# Patient Record
Sex: Female | Born: 1975 | Race: White | Hispanic: No | Marital: Married | Smoking: Never smoker
Health system: Southern US, Community
[De-identification: ages and names within clinical notes are randomized; demographics above are authoritative.]

## PROBLEM LIST (undated history)

## (undated) DIAGNOSIS — R350 Frequency of micturition: Secondary | ICD-10-CM

## (undated) DIAGNOSIS — Z9889 Other specified postprocedural states: Secondary | ICD-10-CM

## (undated) DIAGNOSIS — R351 Nocturia: Secondary | ICD-10-CM

## (undated) DIAGNOSIS — R87610 Atypical squamous cells of undetermined significance on cytologic smear of cervix (ASC-US): Secondary | ICD-10-CM

## (undated) DIAGNOSIS — Z800 Family history of malignant neoplasm of digestive organs: Secondary | ICD-10-CM

## (undated) DIAGNOSIS — Z808 Family history of malignant neoplasm of other organs or systems: Secondary | ICD-10-CM

## (undated) DIAGNOSIS — Z8041 Family history of malignant neoplasm of ovary: Secondary | ICD-10-CM

## (undated) DIAGNOSIS — Z8739 Personal history of other diseases of the musculoskeletal system and connective tissue: Secondary | ICD-10-CM

## (undated) DIAGNOSIS — R3989 Other symptoms and signs involving the genitourinary system: Secondary | ICD-10-CM

## (undated) DIAGNOSIS — R102 Pelvic and perineal pain: Secondary | ICD-10-CM

## (undated) DIAGNOSIS — K219 Gastro-esophageal reflux disease without esophagitis: Secondary | ICD-10-CM

## (undated) DIAGNOSIS — F419 Anxiety disorder, unspecified: Secondary | ICD-10-CM

## (undated) DIAGNOSIS — E039 Hypothyroidism, unspecified: Secondary | ICD-10-CM

## (undated) DIAGNOSIS — Z801 Family history of malignant neoplasm of trachea, bronchus and lung: Secondary | ICD-10-CM

## (undated) DIAGNOSIS — Z87410 Personal history of cervical dysplasia: Secondary | ICD-10-CM

## (undated) DIAGNOSIS — Z803 Family history of malignant neoplasm of breast: Secondary | ICD-10-CM

## (undated) DIAGNOSIS — E063 Autoimmune thyroiditis: Secondary | ICD-10-CM

## (undated) DIAGNOSIS — R112 Nausea with vomiting, unspecified: Secondary | ICD-10-CM

## (undated) HISTORY — DX: Family history of malignant neoplasm of digestive organs: Z80.0

## (undated) HISTORY — DX: Anxiety disorder, unspecified: F41.9

## (undated) HISTORY — DX: Family history of malignant neoplasm of trachea, bronchus and lung: Z80.1

## (undated) HISTORY — DX: Family history of malignant neoplasm of ovary: Z80.41

## (undated) HISTORY — DX: Autoimmune thyroiditis: E06.3

## (undated) HISTORY — DX: Family history of malignant neoplasm of other organs or systems: Z80.8

## (undated) HISTORY — DX: Family history of malignant neoplasm of breast: Z80.3

---
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
## 1997-08-07 ENCOUNTER — Emergency Department (HOSPITAL_COMMUNITY): Admission: EM | Admit: 1997-08-07 | Discharge: 1997-08-07 | Payer: Self-pay | Admitting: Emergency Medicine

## 1998-02-26 HISTORY — PX: COLPOSCOPY: SHX161

## 1998-02-26 HISTORY — PX: TEMPOROMANDIBULAR JOINT SURGERY: SHX35

## 1998-07-18 ENCOUNTER — Other Ambulatory Visit: Admission: RE | Admit: 1998-07-18 | Discharge: 1998-07-18 | Payer: Self-pay | Admitting: Obstetrics and Gynecology

## 1998-07-20 ENCOUNTER — Ambulatory Visit (HOSPITAL_BASED_OUTPATIENT_CLINIC_OR_DEPARTMENT_OTHER): Admission: RE | Admit: 1998-07-20 | Discharge: 1998-07-20 | Payer: Self-pay | Admitting: Oral Surgery

## 1998-11-17 ENCOUNTER — Other Ambulatory Visit: Admission: RE | Admit: 1998-11-17 | Discharge: 1998-11-17 | Payer: Self-pay | Admitting: Obstetrics and Gynecology

## 1999-01-30 ENCOUNTER — Other Ambulatory Visit: Admission: RE | Admit: 1999-01-30 | Discharge: 1999-01-30 | Payer: Self-pay | Admitting: Obstetrics and Gynecology

## 1999-01-30 ENCOUNTER — Encounter (INDEPENDENT_AMBULATORY_CARE_PROVIDER_SITE_OTHER): Payer: Self-pay | Admitting: Specialist

## 1999-03-12 ENCOUNTER — Emergency Department (HOSPITAL_COMMUNITY): Admission: EM | Admit: 1999-03-12 | Discharge: 1999-03-12 | Payer: Self-pay | Admitting: Podiatry

## 1999-07-28 ENCOUNTER — Other Ambulatory Visit: Admission: RE | Admit: 1999-07-28 | Discharge: 1999-07-28 | Payer: Self-pay | Admitting: Obstetrics and Gynecology

## 2000-02-27 HISTORY — PX: OTHER SURGICAL HISTORY: SHX169

## 2000-09-06 ENCOUNTER — Other Ambulatory Visit: Admission: RE | Admit: 2000-09-06 | Discharge: 2000-09-06 | Payer: Self-pay | Admitting: Obstetrics and Gynecology

## 2000-09-17 ENCOUNTER — Ambulatory Visit (HOSPITAL_COMMUNITY): Admission: RE | Admit: 2000-09-17 | Discharge: 2000-09-17 | Payer: Self-pay | Admitting: Obstetrics and Gynecology

## 2000-09-17 ENCOUNTER — Encounter: Payer: Self-pay | Admitting: Obstetrics and Gynecology

## 2002-01-19 ENCOUNTER — Other Ambulatory Visit: Admission: RE | Admit: 2002-01-19 | Discharge: 2002-01-19 | Payer: Self-pay | Admitting: Gynecology

## 2002-02-02 ENCOUNTER — Inpatient Hospital Stay (HOSPITAL_COMMUNITY): Admission: AD | Admit: 2002-02-02 | Discharge: 2002-02-02 | Payer: Self-pay | Admitting: *Deleted

## 2002-08-18 ENCOUNTER — Encounter (INDEPENDENT_AMBULATORY_CARE_PROVIDER_SITE_OTHER): Payer: Self-pay | Admitting: *Deleted

## 2002-08-18 ENCOUNTER — Inpatient Hospital Stay (HOSPITAL_COMMUNITY): Admission: AD | Admit: 2002-08-18 | Discharge: 2002-08-20 | Payer: Self-pay | Admitting: Gynecology

## 2002-09-04 ENCOUNTER — Encounter (INDEPENDENT_AMBULATORY_CARE_PROVIDER_SITE_OTHER): Payer: Self-pay

## 2002-09-04 ENCOUNTER — Ambulatory Visit (HOSPITAL_COMMUNITY): Admission: RE | Admit: 2002-09-04 | Discharge: 2002-09-04 | Payer: Self-pay | Admitting: Gynecology

## 2002-09-04 HISTORY — PX: DILATION AND CURETTAGE OF UTERUS: SHX78

## 2002-10-09 ENCOUNTER — Other Ambulatory Visit: Admission: RE | Admit: 2002-10-09 | Discharge: 2002-10-09 | Payer: Self-pay | Admitting: Gynecology

## 2003-10-11 ENCOUNTER — Other Ambulatory Visit: Admission: RE | Admit: 2003-10-11 | Discharge: 2003-10-11 | Payer: Self-pay | Admitting: Gynecology

## 2004-04-16 ENCOUNTER — Emergency Department (HOSPITAL_COMMUNITY): Admission: EM | Admit: 2004-04-16 | Discharge: 2004-04-16 | Payer: Self-pay | Admitting: *Deleted

## 2004-05-07 ENCOUNTER — Inpatient Hospital Stay (HOSPITAL_COMMUNITY): Admission: AD | Admit: 2004-05-07 | Discharge: 2004-05-10 | Payer: Self-pay | Admitting: Gynecology

## 2004-07-06 ENCOUNTER — Other Ambulatory Visit: Admission: RE | Admit: 2004-07-06 | Discharge: 2004-07-06 | Payer: Self-pay | Admitting: Gynecology

## 2005-08-09 ENCOUNTER — Other Ambulatory Visit: Admission: RE | Admit: 2005-08-09 | Discharge: 2005-08-09 | Payer: Self-pay | Admitting: Gynecology

## 2006-02-26 ENCOUNTER — Emergency Department (HOSPITAL_COMMUNITY): Admission: EM | Admit: 2006-02-26 | Discharge: 2006-02-26 | Payer: Self-pay | Admitting: Emergency Medicine

## 2006-09-19 ENCOUNTER — Other Ambulatory Visit: Admission: RE | Admit: 2006-09-19 | Discharge: 2006-09-19 | Payer: Self-pay | Admitting: Gynecology

## 2007-11-07 ENCOUNTER — Other Ambulatory Visit: Admission: RE | Admit: 2007-11-07 | Discharge: 2007-11-07 | Payer: Self-pay | Admitting: Gynecology

## 2007-11-07 ENCOUNTER — Encounter: Payer: Self-pay | Admitting: Women's Health

## 2007-11-07 ENCOUNTER — Ambulatory Visit: Payer: Self-pay | Admitting: Obstetrics and Gynecology

## 2008-01-15 ENCOUNTER — Ambulatory Visit: Payer: Self-pay | Admitting: Obstetrics and Gynecology

## 2008-03-23 ENCOUNTER — Ambulatory Visit: Payer: Self-pay | Admitting: Obstetrics and Gynecology

## 2008-03-24 ENCOUNTER — Encounter: Admission: RE | Admit: 2008-03-24 | Discharge: 2008-03-24 | Payer: Self-pay | Admitting: Obstetrics and Gynecology

## 2008-08-11 ENCOUNTER — Encounter: Admission: RE | Admit: 2008-08-11 | Discharge: 2008-08-11 | Payer: Self-pay | Admitting: Interventional Radiology

## 2008-08-24 ENCOUNTER — Encounter: Admission: RE | Admit: 2008-08-24 | Discharge: 2008-08-24 | Payer: Self-pay | Admitting: Interventional Radiology

## 2008-09-01 ENCOUNTER — Ambulatory Visit: Payer: Self-pay | Admitting: Obstetrics and Gynecology

## 2008-09-16 ENCOUNTER — Encounter: Admission: RE | Admit: 2008-09-16 | Discharge: 2008-09-16 | Payer: Self-pay | Admitting: Endocrinology

## 2008-10-19 ENCOUNTER — Ambulatory Visit: Payer: Self-pay | Admitting: Gynecology

## 2009-02-09 ENCOUNTER — Other Ambulatory Visit: Admission: RE | Admit: 2009-02-09 | Discharge: 2009-02-09 | Payer: Self-pay | Admitting: Obstetrics and Gynecology

## 2009-02-09 ENCOUNTER — Ambulatory Visit: Payer: Self-pay | Admitting: Obstetrics and Gynecology

## 2009-03-04 ENCOUNTER — Encounter: Admission: RE | Admit: 2009-03-04 | Discharge: 2009-03-04 | Payer: Self-pay | Admitting: Obstetrics and Gynecology

## 2009-06-22 ENCOUNTER — Ambulatory Visit: Payer: Self-pay | Admitting: Obstetrics and Gynecology

## 2009-06-26 DIAGNOSIS — N870 Mild cervical dysplasia: Secondary | ICD-10-CM | POA: Insufficient documentation

## 2009-06-30 ENCOUNTER — Other Ambulatory Visit: Admission: RE | Admit: 2009-06-30 | Discharge: 2009-06-30 | Payer: Self-pay | Admitting: Obstetrics and Gynecology

## 2009-06-30 ENCOUNTER — Ambulatory Visit: Payer: Self-pay | Admitting: Obstetrics and Gynecology

## 2009-07-12 ENCOUNTER — Ambulatory Visit: Payer: Self-pay | Admitting: Obstetrics and Gynecology

## 2010-03-20 ENCOUNTER — Encounter: Payer: Self-pay | Admitting: Endocrinology

## 2010-04-20 ENCOUNTER — Other Ambulatory Visit: Payer: Self-pay | Admitting: Obstetrics and Gynecology

## 2010-04-20 ENCOUNTER — Encounter (INDEPENDENT_AMBULATORY_CARE_PROVIDER_SITE_OTHER): Payer: PRIVATE HEALTH INSURANCE | Admitting: Obstetrics and Gynecology

## 2010-04-20 ENCOUNTER — Other Ambulatory Visit (HOSPITAL_COMMUNITY)
Admission: RE | Admit: 2010-04-20 | Discharge: 2010-04-20 | Disposition: A | Discharge status: Home / Self Care | Payer: PRIVATE HEALTH INSURANCE | Source: Ambulatory Visit | Attending: Obstetrics and Gynecology | Admitting: Obstetrics and Gynecology

## 2010-04-20 DIAGNOSIS — R87619 Unspecified abnormal cytological findings in specimens from cervix uteri: Secondary | ICD-10-CM | POA: Insufficient documentation

## 2010-04-20 DIAGNOSIS — R823 Hemoglobinuria: Secondary | ICD-10-CM

## 2010-04-20 DIAGNOSIS — Z01419 Encounter for gynecological examination (general) (routine) without abnormal findings: Secondary | ICD-10-CM

## 2010-07-06 ENCOUNTER — Ambulatory Visit (INDEPENDENT_AMBULATORY_CARE_PROVIDER_SITE_OTHER): Payer: PRIVATE HEALTH INSURANCE | Admitting: Obstetrics and Gynecology

## 2010-07-06 DIAGNOSIS — K649 Unspecified hemorrhoids: Secondary | ICD-10-CM

## 2010-07-14 NOTE — Discharge Summary (Signed)
   [name], Terri Hoover                             ACCOUNT NO.:  [id number]   MEDICAL RECORD NO.:  [id number]                   PATIENT TYPE:  INP   LOCATION:  9122                                 FACILITY:  [facility]   PHYSICIAN:  Timothy P. Fontaine, M.D.           DATE OF BIRTH:  [birth year]   DATE OF ADMISSION:  08/18/2002  DATE OF DISCHARGE:  08/20/2002                                 DISCHARGE SUMMARY   DISCHARGE DIAGNOSES:  1. Intrauterine pregnancy 40+ weeks delivered.  2. Status post spontaneous vaginal delivery.  3. Hypertension.   HISTORY:  A 35 year old female gravida 1, para 0 with an EDC of August 17, 2002.  Prenatal course complicated with chronic hypertension.   HOSPITAL COURSE:  On August 18, 2002 patient was admitted 40+ weeks with  contractions.  Labor was augmented with Pitocin and on August 18, 2002 at 1857  patient had a spontaneous vaginal delivery of a female, Apgars 8/9, weight 7  pounds 2 ounces.  There was a midline episiotomy, second degree which was  repaired.  There were no complications.  Postpartum patient remained  afebrile, voiding, stable condition.  Was discharged home on August 20, 2002  and given [facility] postpartum instructions/postpartum booklet.   ACCESSORY CLINICAL FINDINGS:  Laboratories:  The patient is A+.  Rubella  immune.  On August 19, 2002 hemoglobin is 10.8.   DISPOSITION:  The patient is discharged to home.  Informed to return to  office six weeks.  Have any problem prior to that time to be seen in office.  Received Tylox p.r.n. pain.     Susa Loffler, P.A.                    Timothy P. Audie Box, M.D.    Ardath Sax  D:  09/04/2002  T:  09/04/2002  Job:  [id number]

## 2010-07-14 NOTE — Op Note (Signed)
Terri Hoover, SHARPLES                             ACCOUNT NO.:  [id number]   MEDICAL RECORD NO.:  [id number]                   PATIENT TYPE:  AMB   LOCATION:  SDC                                  FACILITY:  [facility]   PHYSICIAN:  Juan H. Lily Peer, M.D.             DATE OF BIRTH:  [birth year]   DATE OF PROCEDURE:  09/04/2002  DATE OF DISCHARGE:                                 OPERATIVE REPORT   SURGEON:  Juan H. Lily Peer, M.D.   INDICATIONS FOR PROCEDURE:  This is a 35 year old, gravida 1, para 1, status  post normal spontaneous vaginal delivery on August 18, 2002.  The patient's  conception was a result of IVF.  The patient has a two week history of  persistent vaginal bleeding and ultrasound demonstrated evidence of retained  products of conception.   PREOPERATIVE DIAGNOSIS:  Postpartum retained products of conception.   POSTOPERATIVE DIAGNOSIS:  Postpartum retained products of conception.   ANESTHESIA:  MAC along with a paracervical block.   PROCEDURE:  Dilatation and evacuation.   FINDINGS:  Fragments of clot and tissue were present during the suction  curettage.  The uterus was approximately 10 weeks' size with no palpable  adnexal masses.   DESCRIPTION OF PROCEDURE:  After the patient was adequately counseled, she  came to the operating room.  She successfully underwent intravenous  sedation.  Her vagina and vulva were prepped and draped in the usual sterile  fashion.  The bladder was evacuated of its contents with a red rubber  Roxan Hockey for approximately 25 mL.  Examination demonstrated the uterus  approximately 10 weeks' size.  No palpable adnexal masses.  There was no  active bleeding at the present time.   Lidocaine 2% with 1:100,000 epinephrine was infiltrated into the cervical  stroma at the 2, 4, 8, and 10 o'clock positions for a total of 10 mL.  The  cervix required no dilatation and a 10 mm suction curette was introduced  into the intrauterine cavity after the uterus  was sounded to approximately  10 cm.  The suction curette was utilized to evacuate the intrauterine cavity  of products of conception.  This was then obtained with the Hunter's curette  and all the tissue was removed and the intrauterine cavity appeared to be  smooth at this point.  The patient had received 1 g of Cefotan  prophylactically before surgery.  The single-tooth tenaculum was removed.   The patient was transferred to the recovery room with stable vital signs.  Blood loss was less than 50 mL.  IV fluids consisted of 1 L of lactated  Ringers.  She is Rh positive.  Juan H. Lily Peer, M.D.    JHF/MEDQ  D:  09/04/2002  T:  09/04/2002  Job:  [id number]

## 2010-07-14 NOTE — H&P (Signed)
[name], Terri Hoover                             ACCOUNT NO.:  [id number]   MEDICAL RECORD NO.:  [id number]                   PATIENT TYPE:  AMB   LOCATION:  SDC                                  [facility]ILITY:  [facility]   PHYSICIAN:  Juan H. Lily Peer, M.D.             DATE OF BIRTH:  03/13/1975   DATE OF ADMISSION:  09/04/2002  DATE OF DISCHARGE:                                HISTORY & PHYSICAL   CHIEF COMPLAINT:  Persistent vaginal bleeding after vaginal delivery.   HISTORY:  The patient is a 35 year old gravida 1 para 1 who is status post  normal spontaneous vaginal delivery on August 18, 2002.  The patient has been  persistently bleeding.  Review of her record indicated she conceived via  IVS, has history of prior laparoscopy and treated for endometriosis.  She  has chronic hypertension but was treated with bedrest and placed on no  antihypertensive medication.  She was seen in the office on July 8 because  of her continued vaginal bleeding and an ultrasound was performed.  Her  uterus felt approximately 10 weeks size.  There was some blood in the  vaginal vault.  No palpable adnexal masses.  The ultrasound demonstrated a  uterus measuring 10.3 x 6.2 cm, endometrium 19.5 mm with solid area  measuring 29 x 13 mm within the entire cavity filled with echogenic filled  fluid extending from the cervical area to the fundus.  The right ovary was  normal, left ovary was normal with a thin-wall semi-solid mass measuring 21  x 17 x 60 mm, negative color flow Doppler, consistent with hemorrhagic cyst,  and the cul-de-sac was negative.  The patient is scheduled to undergo a D&E  for retained products of conception later today, July 9.  The pathology  report that was submitted after her delivery had stated that there was  extraplacental fetal membranes, no pathological diagnosis, three vessel  umbilical cord, third trimester placenta 376 grams with mild chronic  inflammation of the maternal floor and  villous hypermaturity.   PAST MEDICAL HISTORY:  She denies any allergies.  She has had history of  laparoscopy, history of endometriosis, in vitro fertilization this  pregnancy.  She has had colposcopy in 2000.   REVIEW OF SYSTEMS:  See [place] form.   PHYSICAL EXAMINATION:  GENERAL:  Well-developed, well-nourished female.  HEENT:  Unremarkable.  NECK:  Supple.  Trachea midline.  No carotid bruits, no thyromegaly.  LUNGS:  Clear to auscultation without rhonchi or wheezes.  HEART:  Regular rate and rhythm.  No murmurs or gallops.  BREAST:  Not done.  ABDOMEN:  Soft, nontender, without rebound or guarding.  PELVIC:  Bartholin's, BUS, Skene glands within normal limits.  Vagina with  some vaginal vault blood noted.  The uterus is anteverted, approximately  eight to ten weeks size.  No palpable adnexal masses.  RECTAL:  Not done.  ASSESSMENT:  A 35 year old gravida 1 para 1 status post normal spontaneous  vaginal delivery on June 27 with persistent vaginal bleeding.  Ultrasound  demonstrated retained products of conception.  The patient was counseled for  surgery for dilatation and evacuation which will be carried out today, July  9, at [facility].  Risks  discussed of infection, bleeding, trauma to internal organs as a result of  perforation during the D&E were discussed.  She will receive prophylaxis  antibiotic.  All questions were answered; will follow accordingly.   PLAN:  The patient scheduled for D&E today, September 04, 2002 at [facility]  at noon.                                               Juan H. Lily Peer, M.D.    JHF/MEDQ  D:  09/04/2002  T:  09/04/2002  Job:  [id number]

## 2010-08-29 ENCOUNTER — Other Ambulatory Visit: Payer: Self-pay | Admitting: Obstetrics and Gynecology

## 2010-08-29 DIAGNOSIS — Z1231 Encounter for screening mammogram for malignant neoplasm of breast: Secondary | ICD-10-CM

## 2010-09-01 ENCOUNTER — Ambulatory Visit: Payer: PRIVATE HEALTH INSURANCE

## 2010-09-05 ENCOUNTER — Ambulatory Visit
Admission: RE | Admit: 2010-09-05 | Discharge: 2010-09-05 | Disposition: A | Discharge status: Home / Self Care | Payer: PRIVATE HEALTH INSURANCE | Source: Ambulatory Visit | Attending: Obstetrics and Gynecology | Admitting: Obstetrics and Gynecology

## 2010-09-05 DIAGNOSIS — Z1231 Encounter for screening mammogram for malignant neoplasm of breast: Secondary | ICD-10-CM

## 2010-09-06 ENCOUNTER — Ambulatory Visit
Admission: RE | Admit: 2010-09-06 | Discharge: 2010-09-06 | Disposition: A | Discharge status: Home / Self Care | Payer: PRIVATE HEALTH INSURANCE | Source: Ambulatory Visit | Attending: Obstetrics and Gynecology | Admitting: Obstetrics and Gynecology

## 2010-09-06 ENCOUNTER — Other Ambulatory Visit: Payer: Self-pay | Admitting: Obstetrics and Gynecology

## 2010-09-06 DIAGNOSIS — R928 Other abnormal and inconclusive findings on diagnostic imaging of breast: Secondary | ICD-10-CM

## 2010-09-08 ENCOUNTER — Other Ambulatory Visit: Payer: Self-pay | Admitting: Obstetrics and Gynecology

## 2010-09-08 DIAGNOSIS — R928 Other abnormal and inconclusive findings on diagnostic imaging of breast: Secondary | ICD-10-CM

## 2010-10-06 ENCOUNTER — Encounter: Payer: Self-pay | Admitting: Gynecology

## 2010-10-06 ENCOUNTER — Other Ambulatory Visit (HOSPITAL_COMMUNITY)
Admission: RE | Admit: 2010-10-06 | Discharge: 2010-10-06 | Disposition: A | Discharge status: Home / Self Care | Payer: PRIVATE HEALTH INSURANCE | Source: Ambulatory Visit | Attending: Gynecology | Admitting: Gynecology

## 2010-10-06 ENCOUNTER — Ambulatory Visit (INDEPENDENT_AMBULATORY_CARE_PROVIDER_SITE_OTHER): Payer: PRIVATE HEALTH INSURANCE | Admitting: Gynecology

## 2010-10-06 VITALS — BP 140/88

## 2010-10-06 DIAGNOSIS — Z113 Encounter for screening for infections with a predominantly sexual mode of transmission: Secondary | ICD-10-CM

## 2010-10-06 DIAGNOSIS — N898 Other specified noninflammatory disorders of vagina: Secondary | ICD-10-CM

## 2010-10-06 DIAGNOSIS — B379 Candidiasis, unspecified: Secondary | ICD-10-CM

## 2010-10-06 DIAGNOSIS — Z01419 Encounter for gynecological examination (general) (routine) without abnormal findings: Secondary | ICD-10-CM | POA: Insufficient documentation

## 2010-10-06 DIAGNOSIS — A630 Anogenital (venereal) warts: Secondary | ICD-10-CM

## 2010-10-06 DIAGNOSIS — R87616 Satisfactory cervical smear but lacking transformation zone: Secondary | ICD-10-CM

## 2010-10-06 DIAGNOSIS — L293 Anogenital pruritus, unspecified: Secondary | ICD-10-CM

## 2010-10-06 MED ORDER — IMIQUIMOD 5 % EX CREA: TOPICAL_CREAM | CUTANEOUS | Status: AC

## 2010-10-06 MED ORDER — FLUCONAZOLE 150 MG PO TABS: 150.0000 mg | ORAL_TABLET | Freq: Once | ORAL | Status: AC

## 2010-10-06 NOTE — Progress Notes (Signed)
Patient is a 35 year old gravida 2 para 2 that presented to the office today with a complaint of some irritation in her vagina. 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 Patient has been divorced for 2 years and had new partner and would like to have an STD screen as well as evaluation for the irritation and itching in her vagina. She denied any true vaginal discharge. She does have a history of endometriosis for which she is on Loestrin 1/20 oral contraceptive pill and is having normal menstrual cycles. Patient also is here for followup Pap smear due to the fact she's had a history of mild dysplasia in May of 2011 and her followup Pap smear in February 2012 had benign reactive reparative changes and was here for followup Pap smear as well.  Pelvic exam: Bartholin urethra Skene glands: Inspected with a magnifying lens the area of concern is the area of the fourchette which could be the sign of an early condylomatous growth developing. Vagina: No gross lesions on inspection slight white discharge Cervix: No gross lesions on inspection Bimanual exam: Not done Rectal exam: Not done  Assessment: Patient with history of condyloma acuminatum in the past with apparent lesion in the area of the fourchette beginning to develop. She will be placed on Aldara topical cream 3 times a week for one month. Her Pap smear was repeated today. Her wet prep demonstrated moniliasis and she will be prescribed Diflucan 150 mg to take one tablet today. A GC and chlamydia culture as well as an HIV RPR and hepatitis B surface antigen were obtained today as well. Will notify patient is any abnormality of any the above-mentioned tests otherwise she will return back in 6 months for annual exam.

## 2010-10-06 NOTE — Patient Instructions (Signed)
Terri Hoover, called in a prescription for Diflucan to take one tablet today for the mildly yeast infection you have. Placed by the lab and daily blood test and we'll discuss drawn today. We'll have all the results including the cultures are available by Monday if you don't hear from our office you can symptoms as were normal. I've called you in a prescription for Aldara topical cream to apply 3 times a week for one month just a very small application at bedtime and make sure you washed off in the morning. Please followup with Dr.Gottsegen in 6 months for your annual exam.

## 2010-10-07 LAB — HEPATITIS B SURFACE ANTIGEN: Hepatitis B Surface Ag: NEGATIVE

## 2010-12-14 ENCOUNTER — Other Ambulatory Visit: Payer: Self-pay | Admitting: Endocrinology

## 2010-12-14 DIAGNOSIS — E049 Nontoxic goiter, unspecified: Secondary | ICD-10-CM

## 2010-12-15 ENCOUNTER — Ambulatory Visit
Admission: RE | Admit: 2010-12-15 | Discharge: 2010-12-15 | Disposition: A | Discharge status: Home / Self Care | Payer: PRIVATE HEALTH INSURANCE | Source: Ambulatory Visit | Attending: Endocrinology | Admitting: Endocrinology

## 2010-12-15 DIAGNOSIS — E049 Nontoxic goiter, unspecified: Secondary | ICD-10-CM

## 2010-12-19 ENCOUNTER — Other Ambulatory Visit (HOSPITAL_COMMUNITY)
Admission: RE | Admit: 2010-12-19 | Discharge: 2010-12-19 | Disposition: A | Discharge status: Home / Self Care | Payer: PRIVATE HEALTH INSURANCE | Source: Ambulatory Visit | Attending: Obstetrics and Gynecology | Admitting: Obstetrics and Gynecology

## 2010-12-19 ENCOUNTER — Ambulatory Visit (INDEPENDENT_AMBULATORY_CARE_PROVIDER_SITE_OTHER): Payer: PRIVATE HEALTH INSURANCE | Admitting: Obstetrics and Gynecology

## 2010-12-19 DIAGNOSIS — Z01419 Encounter for gynecological examination (general) (routine) without abnormal findings: Secondary | ICD-10-CM | POA: Insufficient documentation

## 2010-12-19 DIAGNOSIS — N870 Mild cervical dysplasia: Secondary | ICD-10-CM

## 2010-12-19 DIAGNOSIS — R87810 Cervical high risk human papillomavirus (HPV) DNA test positive: Secondary | ICD-10-CM | POA: Insufficient documentation

## 2010-12-19 DIAGNOSIS — A630 Anogenital (venereal) warts: Secondary | ICD-10-CM

## 2010-12-19 NOTE — Progress Notes (Addendum)
Patient came back today for problem visit. 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 Her last Pap smear in February was normal. She is due for a followup Pap smear. In addition she was seen in our office by Dr. Lily Peer in August, 2012. She was diagnosed with a yeast infection. He was also suspicious that her condylomata had recurred. It was difficult to be sure and he had a used a magnifying lens.he gave her prescription for Aldara cream. She elected not to start it and the areas are still present.  External genitalia: On both inner labia there are very early condylomatous changes. They really require magnification to be well seen. The area on the left is better defined then the area on the right. BUS: Within normal limits. Vaginal exam: Within normal limits. Cervix is clean and patient is menstruating. Kennon Portela present  Assessment: #1. CIN-1 #2. Early condyloma acuminata.  Plan: We will continue to watch low grade CIN. We treated her condyloma with TCA 80%. She will continue to use condoms as she has a new partner. She will return if they don't respond. Last time she had them it only required one treatment.

## 2011-01-08 ENCOUNTER — Encounter: Payer: Self-pay | Admitting: Gynecology

## 2011-01-08 ENCOUNTER — Ambulatory Visit (INDEPENDENT_AMBULATORY_CARE_PROVIDER_SITE_OTHER): Payer: PRIVATE HEALTH INSURANCE | Admitting: Gynecology

## 2011-01-08 DIAGNOSIS — L293 Anogenital pruritus, unspecified: Secondary | ICD-10-CM

## 2011-01-08 DIAGNOSIS — A630 Anogenital (venereal) warts: Secondary | ICD-10-CM | POA: Insufficient documentation

## 2011-01-08 DIAGNOSIS — B3731 Acute candidiasis of vulva and vagina: Secondary | ICD-10-CM

## 2011-01-08 DIAGNOSIS — E063 Autoimmune thyroiditis: Secondary | ICD-10-CM | POA: Insufficient documentation

## 2011-01-08 DIAGNOSIS — R300 Dysuria: Secondary | ICD-10-CM

## 2011-01-08 DIAGNOSIS — N809 Endometriosis, unspecified: Secondary | ICD-10-CM | POA: Insufficient documentation

## 2011-01-08 DIAGNOSIS — N898 Other specified noninflammatory disorders of vagina: Secondary | ICD-10-CM

## 2011-01-08 DIAGNOSIS — N390 Urinary tract infection, site not specified: Secondary | ICD-10-CM

## 2011-01-08 DIAGNOSIS — B373 Candidiasis of vulva and vagina: Secondary | ICD-10-CM

## 2011-01-08 MED ORDER — FLUCONAZOLE 150 MG PO TABS: 150.0000 mg | ORAL_TABLET | Freq: Once | ORAL | Status: AC

## 2011-01-08 MED ORDER — SULFAMETHOXAZOLE-TRIMETHOPRIM 800-160 MG PO TABS: 1.0000 | ORAL_TABLET | Freq: Two times a day (BID) | ORAL | Status: AC

## 2011-01-08 NOTE — Progress Notes (Signed)
Patient presents complaining of several days of dysuria, frequency, low back pain. No fevers chills or other constitutional symptoms. She also notes some vaginal irritation essentially going on since the summer and she tried over-the-counter creams that helped a little but still persists.  Exam Spine without CVA tenderness Abdomen mild suprapubic tenderness no masses guarding rebound organomegaly Pelvic external BUS vagina with thick white discharge, cervix normal, uterus retroverted somewhat globoid normal in size shape contour, adnexa without masses or tenderness  Assessment and plan: 1. White discharge KOH wet prep is positive for yeast we'll treat with Diflucan 150x1 dose follow up if symptoms persist or recur 2. UTI symptoms. Urinalysis consistent with UTI, as is her exam. We'll treat with Septra DS 1 by mouth twice a day x3 days follow up if symptoms persist or recur.

## 2011-01-10 NOTE — Progress Notes (Signed)
  PT. C-O YEAST SYMPTOMS WORSENED. PER DR. TF'S PREVIOUS OV NOTE NEEDS NEW OV. SENT PT TO APPTS. TO SET UP.

## 2011-01-11 ENCOUNTER — Ambulatory Visit (INDEPENDENT_AMBULATORY_CARE_PROVIDER_SITE_OTHER): Payer: PRIVATE HEALTH INSURANCE | Admitting: Gynecology

## 2011-01-11 ENCOUNTER — Encounter: Payer: Self-pay | Admitting: Gynecology

## 2011-01-11 DIAGNOSIS — R82998 Other abnormal findings in urine: Secondary | ICD-10-CM

## 2011-01-11 DIAGNOSIS — N949 Unspecified condition associated with female genital organs and menstrual cycle: Secondary | ICD-10-CM

## 2011-01-11 DIAGNOSIS — B373 Candidiasis of vulva and vagina: Secondary | ICD-10-CM

## 2011-01-11 DIAGNOSIS — B3731 Acute candidiasis of vulva and vagina: Secondary | ICD-10-CM

## 2011-01-11 DIAGNOSIS — N9489 Other specified conditions associated with female genital organs and menstrual cycle: Secondary | ICD-10-CM

## 2011-01-11 MED ORDER — TERCONAZOLE 0.8 % VA CREA: 1.0000 | TOPICAL_CREAM | Freq: Every day | VAGINAL | Status: AC

## 2011-01-11 NOTE — Progress Notes (Signed)
Patient returns having been seen earlier this week to 2 dysuria frequency and some low back pain. She also has some vaginal irritation. She was treated with Septra DS x3 days and Diflucan x1 dose. Said that her vaginal irritation has continued to bother her.  Exam Abdomen soft nontender without masses guarding rebound organomegaly Pelvic external BUS vagina with slight white discharge. Cervix normal. Uterus normal size midline mobile nontender adnexa without masses or tenderness  Assessment and plan: Wet prep is positive for yeast we'll treat as a resistant yeast with Terazol 3 day. UA low-grade positive but is contaminated and will await culture results and treat accordingly.

## 2011-01-25 ENCOUNTER — Ambulatory Visit (INDEPENDENT_AMBULATORY_CARE_PROVIDER_SITE_OTHER): Payer: PRIVATE HEALTH INSURANCE | Admitting: Obstetrics and Gynecology

## 2011-01-25 DIAGNOSIS — N898 Other specified noninflammatory disorders of vagina: Secondary | ICD-10-CM

## 2011-01-25 DIAGNOSIS — B373 Candidiasis of vulva and vagina: Secondary | ICD-10-CM

## 2011-01-25 DIAGNOSIS — B3731 Acute candidiasis of vulva and vagina: Secondary | ICD-10-CM

## 2011-01-25 DIAGNOSIS — L293 Anogenital pruritus, unspecified: Secondary | ICD-10-CM

## 2011-01-25 MED ORDER — FLUCONAZOLE 200 MG PO TABS: 200.0000 mg | ORAL_TABLET | Freq: Every day | ORAL | Status: AC

## 2011-01-25 NOTE — Progress Notes (Signed)
Patient came back to see me today because of both persistent and recurrent vulvar and vaginal irritation. This has been more of an issue since she's had a new sexual partner which is approximately 5 months. They are having frequent intercourse. On several of her visits here both Dr. Lily Peer and myself saw lesions external to her introitus which were suggestive but not diagnostic of HPV. We have previously treated her for condyloma. She was initially treated with Aldara by Dr. Lily Peer and was treated by me with TCA 80%. She really wasn't sure that either treatment helped. During this interval she sure Dr. Audie Box and was treated twice for a yeast infection. She seems to think that these areas of irritation responded when he treated her yeast.  Pelvic exam: Kennon Portela present. External: On both sides of the labia just outside the introitus there are raised areas consistent either with early HPV or inflammatory changes from yeast. BUS: Normal. Vagina: Some discharge, wet prep positive for yeast. Cervix: Clean.  Assessment: Recurrent yeast vaginitis. External irritation probably from yeast rather than HPV.  Plan: Offered to biopsy these areas although I told her I favored this to be related to the yeast both on appearance and previous history. She agreed to decline biopsy and instead we have treated her with Diflucan 200 mg daily for 7 days and boric acid suppositories 600 mg one 3 times a week in the vagina to continue an acid pH.

## 2011-04-25 ENCOUNTER — Other Ambulatory Visit: Payer: Self-pay | Admitting: Obstetrics and Gynecology

## 2011-06-06 ENCOUNTER — Encounter: Payer: Self-pay | Admitting: Obstetrics and Gynecology

## 2011-06-06 ENCOUNTER — Other Ambulatory Visit (HOSPITAL_COMMUNITY)
Admission: RE | Admit: 2011-06-06 | Discharge: 2011-06-06 | Disposition: A | Discharge status: Home / Self Care | Payer: PRIVATE HEALTH INSURANCE | Source: Ambulatory Visit | Attending: Obstetrics and Gynecology | Admitting: Obstetrics and Gynecology

## 2011-06-06 ENCOUNTER — Ambulatory Visit (INDEPENDENT_AMBULATORY_CARE_PROVIDER_SITE_OTHER): Payer: PRIVATE HEALTH INSURANCE | Admitting: Obstetrics and Gynecology

## 2011-06-06 ENCOUNTER — Other Ambulatory Visit: Payer: Self-pay | Admitting: Obstetrics and Gynecology

## 2011-06-06 VITALS — BP 120/74 | Ht 65.0 in | Wt 128.0 lb

## 2011-06-06 DIAGNOSIS — Z01419 Encounter for gynecological examination (general) (routine) without abnormal findings: Secondary | ICD-10-CM

## 2011-06-06 DIAGNOSIS — N870 Mild cervical dysplasia: Secondary | ICD-10-CM

## 2011-06-06 LAB — URINALYSIS W MICROSCOPIC + REFLEX CULTURE
Leukocytes, UA: NEGATIVE
Nitrite: NEGATIVE
Protein, ur: 30 mg/dL — AB
Urobilinogen, UA: 1 mg/dL (ref 0.0–1.0)
pH: >9 — ABNORMAL HIGH (ref 5.0–8.0)

## 2011-06-06 MED ORDER — NORETHIN ACE-ETH ESTRAD-FE 1-20 MG-MCG PO TABS: ORAL_TABLET | ORAL | Status: DC

## 2011-06-06 NOTE — Progress Notes (Signed)
Patient came to see me today for her annual GYN exam. She does her lab work through her endocrinologist. She has a light cycle each  Month on her birth control pills. She is engaged to get married. She does have some premenstrual symptoms such as lower abdominal pain and change in her bowel habits. She does have a history of endometriosis. She was wondering if she could do continuous birth control pills to eliminate the above. After her last delivery she tried a Mirena IUD but did not like it because of headaches and had removed. She has now had one normal Pap smear since her diagnosis of CIN-1. She is having some urinary symptoms that are suggestive that she has a UTI. They are similar to symptoms she had previously with a UTI. Her urinalysis was just slightly abnormal today.  Physical examination: kim gardner present HEENT within normal limits. Neck: Thyroid not large. No masses. Supraclavicular nodes: not enlarged. Breasts: Examined in both sitting and lying  position. No skin changes and no masses. Abdomen: Soft no guarding rebound or masses or hernia. Pelvic: External: Within normal limits. BUS: Within normal limits. Vaginal:within normal limits. Good estrogen effect. No evidence of cystocele rectocele or enterocele. Cervix: clean. Uterus: Normal size and shape. Adnexa: No masses. Rectovaginal exam: Confirmatory and negative. Extremities: Within normal limits.  Assessment: #1. CIN-1 #2. Endometriosis slightly symptomatic #3. Possible UTI  Plan: Pap recall 6 months. Continue yearly mammograms because of family history of early onset breast cancer. She will discuss with her maternal aunt who was diagnosed in her mid 30s if she had BRCA1 or BRCA2 testing. Switched her birth control pills to continuous active pills to eliminate periods and symptoms.

## 2011-06-08 LAB — URINE CULTURE: Colony Count: 30000

## 2011-08-29 ENCOUNTER — Other Ambulatory Visit: Payer: Self-pay | Admitting: Obstetrics and Gynecology

## 2011-08-29 DIAGNOSIS — Z1231 Encounter for screening mammogram for malignant neoplasm of breast: Secondary | ICD-10-CM

## 2011-09-07 ENCOUNTER — Ambulatory Visit
Admission: RE | Admit: 2011-09-07 | Discharge: 2011-09-07 | Disposition: A | Discharge status: Home / Self Care | Payer: PRIVATE HEALTH INSURANCE | Source: Ambulatory Visit | Attending: Obstetrics and Gynecology | Admitting: Obstetrics and Gynecology

## 2011-09-07 DIAGNOSIS — Z1231 Encounter for screening mammogram for malignant neoplasm of breast: Secondary | ICD-10-CM

## 2011-10-02 ENCOUNTER — Ambulatory Visit (INDEPENDENT_AMBULATORY_CARE_PROVIDER_SITE_OTHER): Payer: PRIVATE HEALTH INSURANCE | Admitting: Obstetrics and Gynecology

## 2011-10-02 VITALS — Temp 97.1°F

## 2011-10-02 DIAGNOSIS — R102 Pelvic and perineal pain: Secondary | ICD-10-CM

## 2011-10-02 DIAGNOSIS — N949 Unspecified condition associated with female genital organs and menstrual cycle: Secondary | ICD-10-CM

## 2011-10-02 LAB — URINALYSIS W MICROSCOPIC + REFLEX CULTURE
Hgb urine dipstick: NEGATIVE
Ketones, ur: NEGATIVE mg/dL
Nitrite: NEGATIVE
Protein, ur: 30 mg/dL — AB

## 2011-10-02 MED ORDER — CIPROFLOXACIN HCL 500 MG PO TABS: 500.0000 mg | ORAL_TABLET | Freq: Two times a day (BID) | ORAL | Status: AC

## 2011-10-02 NOTE — Progress Notes (Signed)
Patient came to see me today because she thought she had a urinary tract infection. For the last several weeks she has had some urinary frequency without dysuria. She is having both lower back pain and suprapubic pain. Last night she ran a low-grade fever slightly over 100.  Exam: Kennon Portela present. Abdomen is soft without guarding rebound or masses. Pelvic exam: External within normal limits. BUS within normal limits. Vaginal exam within normal limits. Cervix is clean without lesions. Uterus is normal size and shape and slightly tender. Adnexa failed to reveal masses. Rectovaginal examination is confirmatory and without masses. Urinalysis shows 7-10 white blood cells per high power field.  Assessment: Urinary tract infection  Plan: Cipro 500 mg twice a day for 7 days. Patient is due for annual exam in September and we will recheck her urine then.

## 2011-10-03 LAB — URINE CULTURE
Colony Count: NO GROWTH
Organism ID, Bacteria: NO GROWTH

## 2012-01-02 ENCOUNTER — Encounter: Payer: Self-pay | Admitting: Gynecology

## 2012-01-02 ENCOUNTER — Ambulatory Visit (INDEPENDENT_AMBULATORY_CARE_PROVIDER_SITE_OTHER): Payer: PRIVATE HEALTH INSURANCE | Admitting: Gynecology

## 2012-01-02 DIAGNOSIS — R350 Frequency of micturition: Secondary | ICD-10-CM

## 2012-01-02 DIAGNOSIS — N390 Urinary tract infection, site not specified: Secondary | ICD-10-CM

## 2012-01-02 LAB — URINALYSIS W MICROSCOPIC + REFLEX CULTURE
Crystals: NONE SEEN
Nitrite: NEGATIVE
Specific Gravity, Urine: >1.03 — ABNORMAL HIGH (ref 1.005–1.030)
Urobilinogen, UA: 0.2 mg/dL (ref 0.0–1.0)

## 2012-01-02 MED ORDER — NITROFURANTOIN MONOHYD MACRO 100 MG PO CAPS: ORAL_CAPSULE | ORAL | Status: DC

## 2012-01-02 MED ORDER — CIPROFLOXACIN HCL 250 MG PO TABS: 250.0000 mg | ORAL_TABLET | Freq: Two times a day (BID) | ORAL | Status: DC

## 2012-01-02 NOTE — Patient Instructions (Signed)
Urinary Tract Infection Urinary tract infections (UTIs) can develop anywhere along your urinary tract. Your urinary tract is your body's drainage system for removing wastes and extra water. Your urinary tract includes two kidneys, two ureters, a bladder, and a urethra. Your kidneys are a pair of bean-shaped organs. Each kidney is about the size of your fist. They are located below your ribs, one on each side of your spine. CAUSES Infections are caused by microbes, which are microscopic organisms, including fungi, viruses, and bacteria. These organisms are so small that they can only be seen through a microscope. Bacteria are the microbes that most commonly cause UTIs. SYMPTOMS  Symptoms of UTIs may vary by age and gender of the patient and by the location of the infection. Symptoms in young women typically include a frequent and intense urge to urinate and a painful, burning feeling in the bladder or urethra during urination. Older women and men are more likely to be tired, shaky, and weak and have muscle aches and abdominal pain. A fever may mean the infection is in your kidneys. Other symptoms of a kidney infection include pain in your back or sides below the ribs, nausea, and vomiting. DIAGNOSIS To diagnose a UTI, your caregiver will ask you about your symptoms. Your caregiver also will ask to provide a urine sample. The urine sample will be tested for bacteria and white blood cells. White blood cells are made by your body to help fight infection. TREATMENT  Typically, UTIs can be treated with medication. Because most UTIs are caused by a bacterial infection, they usually can be treated with the use of antibiotics. The choice of antibiotic and length of treatment depend on your symptoms and the type of bacteria causing your infection. HOME CARE INSTRUCTIONS  If you were prescribed antibiotics, take them exactly as your caregiver instructs you. Finish the medication even if you feel better after you  have only taken some of the medication.  Drink enough water and fluids to keep your urine clear or pale yellow.  Avoid caffeine, tea, and carbonated beverages. They tend to irritate your bladder.  Empty your bladder often. Avoid holding urine for long periods of time.  Empty your bladder before and after sexual intercourse.  After a bowel movement, women should cleanse from front to back. Use each tissue only once. SEEK MEDICAL CARE IF:   You have back pain.  You develop a fever.  Your symptoms do not begin to resolve within 3 days. SEEK IMMEDIATE MEDICAL CARE IF:   You have severe back pain or lower abdominal pain.  You develop chills.  You have nausea or vomiting.  You have continued burning or discomfort with urination. MAKE SURE YOU:   Understand these instructions.  Will watch your condition.  Will get help right away if you are not doing well or get worse. Document Released: 11/22/2004 Document Revised: 08/14/2011 Document Reviewed: 03/23/2011 [employer] Patient Information 2013 [employer].  

## 2012-01-02 NOTE — Progress Notes (Signed)
Patient presents complaining of frequency and mild dysuria. She's been treated on several occasions this past year for UTI. Notes now that she's getting up in the middle night 2 and 3 times to urinate. No fever chills low back pain. Does note she recently got married in June. Does feel that the seem to follow intercourse.  Exam with Selena Batten assistant Spine straight without CVA tenderness. Abdomen soft nontender without masses guarding rebound organomegaly. Pelvic external BUS vagina normal. Cervix normal. Uterus normal size midline mobile nontender. Adnexa without masses or tenderness.  Assessment and plan: Symptoms and UA consistent with low-grade UTI. We'll treat with ciprofloxacin 250 mg twice a day x7 days. We'll plan post coital antibiotic treatment with Macrobid 100 mg after intercourse #30 refill x2. This ends her UTIs them we'll follow. If they continue I discussed the possibility of interstitial cystitis or other resources for her UTIs and recommended urologic evaluation.  She will call us if they recur I will referred to urology.

## 2012-01-05 LAB — URINE CULTURE: Colony Count: 85000

## 2012-01-08 ENCOUNTER — Telehealth: Payer: Self-pay | Admitting: *Deleted

## 2012-01-08 MED ORDER — FLUCONAZOLE 150 MG PO TABS: 150.0000 mg | ORAL_TABLET | Freq: Once | ORAL | Status: DC

## 2012-01-08 NOTE — Telephone Encounter (Signed)
Diflucan 150mg x 1

## 2012-01-08 NOTE — Telephone Encounter (Signed)
Pt informed with the below note. 

## 2012-01-08 NOTE — Telephone Encounter (Signed)
Pt was given ciprofloxacin 250 mg twice a day x7 days at OV 01/02/12 has 1 pill left of medication and now has yeast infection. C/o white discharge and vaginal itching. Pt is requesting diflucan pill. Please advise

## 2012-02-22 ENCOUNTER — Other Ambulatory Visit: Payer: Self-pay | Admitting: Obstetrics and Gynecology

## 2012-03-04 ENCOUNTER — Ambulatory Visit: Payer: PRIVATE HEALTH INSURANCE | Admitting: Gynecology

## 2012-03-05 ENCOUNTER — Ambulatory Visit (INDEPENDENT_AMBULATORY_CARE_PROVIDER_SITE_OTHER): Payer: PRIVATE HEALTH INSURANCE | Admitting: Gynecology

## 2012-03-05 ENCOUNTER — Encounter: Payer: Self-pay | Admitting: Gynecology

## 2012-03-05 VITALS — BP 118/78

## 2012-03-05 DIAGNOSIS — K649 Unspecified hemorrhoids: Secondary | ICD-10-CM | POA: Insufficient documentation

## 2012-03-05 NOTE — Progress Notes (Signed)
Patient presented to the office today complaining of tenderness in the rectal area. Patient a year and a half ago had thrombosed hemorrhoids there were operated on. Patient denies any blood in her stools. She does have bowel movements every other day. She states at times she has been constipated. She's tried over-the-counter preparation H. as well as witch hazel pads with minimal relief. She states the discomfort is not as bad as when she had thrombosed hemorrhoid the urine half ago. Patient's currently on oral contraceptive pills.  Exam: The patient was placed in the knee-chest position and an endoscopic exam was done. No evidence of internal hemorrhoids was noted. A normal sphincter tone intact. A small external hemorrhoid at the 8:00 position with a slight bluish hue which was soft and nontender.  Assessment/plan: #1 constipation patient will be given samples and encouraged to take MiraLax 1 tablespoon daily with juice her water. She was also in her to increase her fluid and fiber intake. I've given her literature information to read as well. #2 for her small non-thrombosed external hemorrhoid for her discomfort she will be prescribed 0.5% nitroglycerin ointment to apply to 3 times a day as needed. If this does not improve her symptoms are the hemorrhoid becomes hard and works in a pain level that was a urine half ago she will report to the office immediately. The risks benefits pros and cons of the medication were discussed.

## 2012-03-05 NOTE — Patient Instructions (Addendum)
Patient information: Hemorrhoids (Beyond the Basics)  Author Marzella Schlein, MD Section Editor Greasewood Callas, MD Deputy Editor Joya Martyr, MD, MPH Disclosures  All topics are updated as new evidence becomes available and our peer review process is complete.  Literature review current through: Dec 2013.  This topic last updated: Mar 05, 2011.  HEMORRHOIDS OVERVIEW - Hemorrhoids are enlarged or swollen veins in the lower rectum. The most common symptoms of hemorrhoids are rectal bleeding, itching, and pain. You may be able to see or feel hemorrhoids around the outside of the anus, or they may be hidden from view, inside the rectum (figure 1A-B). Hemorrhoids are common, occurring in both men and women. Although hemorrhoids do not usually cause serious health problems, they can be annoying and uncomfortable. Fortunately, treatments for hemorrhoids are available and can usually minimize the bothersome symptoms. More detailed information about hemorrhoids is available by subscription. (See "Overview of hemorrhoids" and "Treatment of hemorrhoids".) HEMORRHOID SYMPTOMS - Hemorrhoids are more common in people who are older and in those who have diarrhea, pelvic tumors, during or after pregnancy, and in people who sit for prolonged periods of time and/or strain (push hard) to have a bowel movement. Symptoms of hemorrhoids can include the following: Painless rectal bleeding  Anal itching or pain  Tissue bulging around the anus  Leakage of feces or difficulty cleaning after a bowel movement Rectal bleeding - Many people with hemorrhoids notice bright red blood on the stool, in the toilet, or on the toilet tissue after a bowel movement. The amount of blood is usually small. However, even a small amount of blood in the toilet bowl can cause the water to appear bright red, which can be frightening. Less commonly, bleeding can be heavy. While hemorrhoids are one of the most common reasons for rectal  bleeding, there are other, more serious causes. It is not possible to know what is causing rectal bleeding unless you are examined. If you see bleeding after a bowel movement, call your healthcare provider. (See "Patient information: Blood in the stool (rectal bleeding) in adults (Beyond the Basics)".) Itching - Hemorrhoids commonly cause itching and irritation of skin around the anus. Pain - Hemorrhoids can become painful. If you develop severe pain, call your healthcare provider immediately because this may be a sign of a serious problem. HEMORRHOID DIAGNOSIS - To diagnose hemorrhoids, your clinician will examine your rectum and anus, and may insert a gloved finger into the rectum. If there is bleeding, testing should include a procedure that allows your healthcare provider to look inside the anus (called anoscopy) or colon (sigmoidoscopy or colonoscopy). (See "Patient information: Flexible sigmoidoscopy (Beyond the Basics)".) INITIAL HEMORRHOID TREATMENT - One of the most important steps in treating hemorrhoids is avoiding constipation (hard or infrequent stools). Hard stools can lead to rectal bleeding and/or a tear in the anus, called an anal fissure. In addition, pushing and straining to move your bowels can worsen existing hemorrhoids and increase the risk of developing new hemorrhoids. (See "Patient information: Anal fissure (Beyond the Basics)".) Fiber supplements - Increasing fiber in your diet is one of the best ways to soften your stools. Fiber is found in fruits and vegetables. The recommended amount of dietary fiber is 20 to 35 grams per day (table 1). (See "Patient information: High-fiber diet (Beyond the Basics)".) Several fiber supplements are available, including psyllium (Konsyl; Metamucil; Perdiem), methylcellulose (Citrucel), calcium polycarbophil (FiberCon; Fiber-Lax; Mitrolan), and wheat dextrin (Benefiber). Start with a small amount and increase slowly to  avoid side  effects. Laxatives - If increasing fiber does not relieve your constipation, or if side effects of fiber are intolerable, you can try a laxative. Many people worry about taking laxatives regularly, fearing that they will not be able to have a bowel movement if the laxative is stopped. Laxatives are not "addictive" and using laxatives does not increase your risk of constipation in the future. Instead, using a laxative may actually prevent long-term problems with constipation. (See "Patient information: Constipation in adults (Beyond the Basics)".) Warm sitz baths - During a sitz bath, you soak the rectal area in warm water for 10 to 15 minutes two to three times daily. Sitz baths are available in most drugstores. It is also possible to use a bathtub and sit in 2 to 3 inches of warm water. Do not add soap, bubble bath, or other additives in the water. Sitz baths work by improving blood flow and relaxing the muscle around the anus, called the internal anal sphincter. Topical treatments - Various creams and suppositories are available to treat hemorrhoids, and many are available without a prescription. Pain-relieving creams and hydrocortisone rectal suppositories may help relieve pain, inflammation, and itching, at least temporarily. You should not use hemorrhoid creams and suppositories, particularly hydrocortisone, for longer than one week, unless your healthcare provider approves. MINIMALLY INVASIVE TREATMENT - If you have bothersome hemorrhoids after using conservative measures, you may want to consider a minimally invasive procedure. Most procedures are performed as a day surgery. The following procedures are intended for treatment of internal hemorrhoids. Rubber band ligation - Rubber band ligation is the most widely used procedure. It is successful in approximately 70 to 80 percent of patients. Rubber bands or rings are placed around the base of an internal hemorrhoid. As the blood supply is restricted,  the hemorrhoid shrinks and degenerates over several days. Many patients report a sense of "tightness" after the procedure, which may improve with warm sitz baths. Patients are encouraged to use fiber supplements to avoid constipation. Delayed bleeding may occur when the rubber band falls off, usually two to four days after the procedure. In some cases, a raw and sore area develops five to seven days following the procedure. Other less common complications of rubber band ligation include severe pain, thrombosis of other hemorrhoids, and localized infection or pus formation (abscess). Rubber band ligation rarely causes serious complications. Laser, infrared, or bipolar coagulation - These methods involve the use of laser or infrared light or heat to destroy internal hemorrhoids. Sclerotherapy - During sclerotherapy, a chemical solution is injected into hemorrhoidal tissue, causing the tissue to break down and form a scar. Sclerotherapy may be less effective than rubber band ligation. HEMORRHOID SURGERY - If you continue to have hemorrhoids despite conservative or minimally invasive therapies, you may require surgical removal of hemorrhoids (hemorrhoidectomy). Surgery is the treatment of choice for patients with large internal hemorrhoids. Hemorrhoidectomy involves surgically removing excess hemorrhoidal tissue. It is successful in 95 percent of patients.   Patient information: High-fiber diet (Beyond the Basics)  Author Miachel Roux, MD Section Editor Conception Chancy, MD Deputy Editor Joya Martyr, MD, MPH Disclosures  All topics are updated as new evidence becomes available and our peer review process is complete.  Literature review current through: Dec 2013.  This topic last updated: Sep 14, 2011.  HIGH-FIBER DIET OVERVIEW - Eating a diet that is high in fiber has many potential health benefits, including a decreased risk of heart disease, stroke, and type 2 diabetes. Because  high-fiber foods may  be healthy for reasons other than their fiber content, the research has not always been able to determine if fiber is the healthful component. A high-fiber diet is a commonly recommended treatment for digestive problems, such as constipation, diarrhea, and hemorrhoids, although individual results vary widely, and the scientific evidence supporting these recommendations is weak. Fiber is normally found in beans, grains, vegetables, and fruits. However, most people do not eat as much fiber as is commonly recommended. This topic discusses what fiber is, why it is helpful, and how to increase dietary fiber. WHAT IS FIBER? - There is no single dietary "fiber." Traditionally, fiber was considered that substance found in the outer layers of grains or plants and which was not digested in the intestines. Wheat bran, the outer layer of wheat grain, fit this model. We now know that "fiber" actually consists of a number of different substances. The term "dietary fiber" includes all of these substances and is now considered a better term than just "fiber." Most dietary fiber is not digested or absorbed, so it stays within the intestine where it modulates digestion of other foods and affects the consistency of stool. There are two types of fiber, each of which is thought to have its own benefits: Soluble fiber consists of a group of substances that is made of carbohydrates and dissolves in water. Examples of foods that contain soluble fiber include fruits, oats, barley, and legumes (peas and beans).  Insoluble fiber comes from plant cells walls and does not dissolve in water. Examples of foods that contain insoluble fiber include wheat, rye, and other grains. The traditional fiber, wheat bran, is a type of insoluble fiber.  Dietary fiber is the sum of all soluble and insoluble fiber. BENEFITS OF A HIGH-FIBER DIET - The health effects of a high-fiber may depend to some extent on the type of fiber eaten. However, the  difference between the health effects of two types of fiber are not very clear and may vary between individuals, so many providers encourage adding fiber in whatever way is easiest for the patient. There are several potential benefits of eating a diet with high-fiber content: Insoluble fiber (wheat bran, and some fruits and vegetables) has been recommended to treat digestive problems such as constipation, hemorrhoids, chronic diarrhea, and fecal incontinence. Fiber bulks the stool, making it softer and easier to pass. Fiber helps the stool pass regularly, although it is not a laxative. (See "Patient information: Constipation in adults (Beyond the Basics)" and "Patient information: Hemorrhoids (Beyond the Basics)" and "Patient information: Chronic diarrhea in adults (Beyond the Basics)".)  Soluble fiber (psyllium, pectin, wheat dextrin, and oat products) can reduce the risk of coronary artery disease and stroke by 40 to 50 percent (compared to a low fiber diet) [1,2].  Soluble fiber can also reduce the risk of developing type 2 diabetes. In people who have diabetes (type 1 and 2), soluble fiber can help to control blood glucose levels.  It is not clear if a high-fiber diet is beneficial for people with irritable bowel syndrome or diverticulosis. Fiber may be helpful for some people with these diagnoses while it may worsen symptoms in others. HOW MUCH FIBER DO I NEED? - The recommended amount of dietary fiber is 20 to 35 grams per day. By reading the nutrition label on packaged foods, it is possible to determine the number of grams of dietary fiber per serving (figure 1). Dietary sources of fiber - The fiber content of many foods, including  fruits and vegetables, is available in the table (table 1). Breakfast cereals can be a good source of fiber. Some fruits and vegetables are particularly helpful in treating constipation, such as prunes and prune juice. Other sources of fiber - For those who do not like  high-fiber foods such as fruits, beans, and vegetables, a good source of fiber is unprocessed wheat bran; one to two tablespoons can be mixed with food. One tablespoon of wheat bran contains approximately 1.6 grams of fiber. In addition, a number of fiber supplements are available. Examples include psyllium, methylcellulose, wheat dextrin, and calcium polycarbophil. The dose of the fiber supplement should be increased slowly to prevent gas and cramping, and the supplement should be taken with adequate fluid. The fiber in these supplements is mostly of the soluble type. FIBER SIDE EFFECTS - Adding fiber to the diet can have some side effects, such as abdominal bloating or gas. This can sometimes be minimized by starting with a small amount and slowly increasing until stools become softer and more frequent. However, many people, including those with irritable bowel syndrome, cannot tolerate fiber supplements and do better by not increasing fiber in their diet

## 2012-08-20 ENCOUNTER — Encounter: Payer: Self-pay | Admitting: Gynecology

## 2012-08-20 ENCOUNTER — Ambulatory Visit (INDEPENDENT_AMBULATORY_CARE_PROVIDER_SITE_OTHER): Payer: PRIVATE HEALTH INSURANCE

## 2012-08-20 ENCOUNTER — Ambulatory Visit (INDEPENDENT_AMBULATORY_CARE_PROVIDER_SITE_OTHER): Payer: PRIVATE HEALTH INSURANCE | Admitting: Gynecology

## 2012-08-20 VITALS — BP 120/80

## 2012-08-20 DIAGNOSIS — N921 Excessive and frequent menstruation with irregular cycle: Secondary | ICD-10-CM

## 2012-08-20 DIAGNOSIS — R188 Other ascites: Secondary | ICD-10-CM

## 2012-08-20 DIAGNOSIS — R102 Pelvic and perineal pain: Secondary | ICD-10-CM

## 2012-08-20 DIAGNOSIS — N854 Malposition of uterus: Secondary | ICD-10-CM

## 2012-08-20 DIAGNOSIS — Z8742 Personal history of other diseases of the female genital tract: Secondary | ICD-10-CM

## 2012-08-20 DIAGNOSIS — N949 Unspecified condition associated with female genital organs and menstrual cycle: Secondary | ICD-10-CM

## 2012-08-20 DIAGNOSIS — N839 Noninflammatory disorder of ovary, fallopian tube and broad ligament, unspecified: Secondary | ICD-10-CM

## 2012-08-20 LAB — URINALYSIS W MICROSCOPIC + REFLEX CULTURE
Leukocytes, UA: NEGATIVE
Nitrite: NEGATIVE
Protein, ur: 30 mg/dL — AB

## 2012-08-20 MED ORDER — KETOROLAC TROMETHAMINE 10 MG PO TABS: 10.0000 mg | ORAL_TABLET | Freq: Four times a day (QID) | ORAL | Status: DC | PRN

## 2012-08-20 NOTE — Progress Notes (Signed)
37 year old who is on continuous oral contraceptive pill Terri Hoover 1/20 secondary to endometriosis. Patient had presented to the office because she had not had any menses and began bleeding a few days ago and discontinued the oral contraceptive pill. She had some mild low Donald discomfort but no dyspareunia and otherwise had been doing well. Dr. Lurene Shadow  had been following her for her Hashimoto's thyroiditis but she has mentioned that [facility] and she discontinued the levothyroxin and started nature thyroid 60 mcg daily? She is due for her annual exam next month.  Exam: Abdomen soft nontender no rebound or guarding Pelvic: Bartholin urethra Skene was within normal limits Vagina: No lesions or discharge Cervix: No lesions or discharge Uterus: Retroverted minimally tender near the uterosacral ligament region Adnexa: No palpable mass or tenderness  Urinalysis: 7-10 rbc, rare bacteria no white blood cells seen  Ultrasound: Uterus measured 8.5 x 6.1 x 4.2 cm with endometrial stripe a 3 mm. Right ovary normal left ovary thinwall cyst diffuse homogeneous low level echoes avascular measuring 16 x 19 x 12 mm. Moderate amount of fluid was seen in the cul-de-sac.  Patient was counseled and underwent an endometrial biopsy. Tissue submitted for histological evaluation  Assessment/plan: Patient with past history of severe endometriosis has done well on continuous low-dose oral contraceptive pill. I have reassured her that she could stay on it into her transitional years and menopause. She may have had a small ovarian cyst that had ruptured and causes irregular bleeding based on the fluid that was seen in the cul-de-sac. She was given Toradol 30 mg IM and prescription for 10 mg by mouth every 6 hours for the next 3-5 days as needed. Will notify her of there is any abnormality on the endometrial biopsy. She may restart her oral contraceptive pills. We'll see her next month for  her annual exam.

## 2012-08-22 LAB — URINE CULTURE
Colony Count: NO GROWTH
Organism ID, Bacteria: NO GROWTH

## 2012-08-26 ENCOUNTER — Other Ambulatory Visit: Payer: Self-pay | Admitting: Anesthesiology

## 2012-08-26 ENCOUNTER — Other Ambulatory Visit: Payer: PRIVATE HEALTH INSURANCE

## 2012-08-26 DIAGNOSIS — N390 Urinary tract infection, site not specified: Secondary | ICD-10-CM

## 2012-08-27 LAB — URINALYSIS W MICROSCOPIC + REFLEX CULTURE
Bacteria, UA: NONE SEEN
Bilirubin Urine: NEGATIVE
Crystals: NONE SEEN
Nitrite: NEGATIVE
Protein, ur: NEGATIVE mg/dL
Specific Gravity, Urine: 1.019 (ref 1.005–1.030)
Urobilinogen, UA: 0.2 mg/dL (ref 0.0–1.0)

## 2012-09-18 ENCOUNTER — Encounter: Payer: Self-pay | Admitting: Gynecology

## 2012-10-08 ENCOUNTER — Ambulatory Visit (INDEPENDENT_AMBULATORY_CARE_PROVIDER_SITE_OTHER): Payer: PRIVATE HEALTH INSURANCE | Admitting: Gynecology

## 2012-10-08 ENCOUNTER — Encounter: Payer: Self-pay | Admitting: Gynecology

## 2012-10-08 VITALS — BP 124/70 | Ht 65.0 in | Wt 130.0 lb

## 2012-10-08 DIAGNOSIS — Z23 Encounter for immunization: Secondary | ICD-10-CM

## 2012-10-08 DIAGNOSIS — Z803 Family history of malignant neoplasm of breast: Secondary | ICD-10-CM

## 2012-10-08 DIAGNOSIS — Z01419 Encounter for gynecological examination (general) (routine) without abnormal findings: Secondary | ICD-10-CM

## 2012-10-08 MED ORDER — NORETHIN ACE-ETH ESTRAD-FE 1-20 MG-MCG PO TABS: 1.0000 | ORAL_TABLET | Freq: Every day | ORAL | Status: DC

## 2012-10-08 NOTE — Patient Instructions (Signed)

## 2012-10-08 NOTE — Progress Notes (Signed)
Terri Hoover November 22, 1975 [id number]   History:    37 y.o.  for annual gyn exam with no complaints today. Patient with prior laparoscopy in 2002 where endometriosis had been diagnosed. She is on continuous oral contraceptive pill. The patient is a Garment/textile technologist and has much anxiety as to breast cancer since she has an aunt and maternal grandmother both had breast cancer. She had a mammogram last year 3D report to be normal although dense. Patient does her monthly breast exam. 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 Patient has history of Hashimoto's thyroiditis and has been followed by her endocrinologist Dr. Lurene Shadow who has been doing her lab work.  Past medical history,surgical history, family history and social history were all reviewed and documented in the EPIC chart.  Gynecologic History No LMP recorded. Patient is not currently having periods (Reason: Oral contraceptives). Contraception: OCP (estrogen/progesterone) Last Pap: 2013. Results were: normal Last mammogram: 2013. Results were: normal but dense  Obstetric History OB History  Gravida Para Term Preterm AB SAB TAB Ectopic Multiple Living  2 2 2       2     # Outcome Date GA Lbr Len/2nd Weight Sex Delivery Anes PTL Lv  2 TRM           1 TRM                ROS: A ROS was performed and pertinent positives and negatives are included in the history.  GENERAL: No fevers or chills. HEENT: No change in vision, no earache, sore throat or sinus congestion. NECK: No pain or stiffness. CARDIOVASCULAR: No chest pain or pressure. No palpitations. PULMONARY: No shortness of breath, cough or wheeze. GASTROINTESTINAL: No abdominal pain, nausea, vomiting or diarrhea, melena or bright red blood per rectum. GENITOURINARY: No urinary frequency, urgency, hesitancy or dysuria. MUSCULOSKELETAL: No joint or muscle pain, no back pain, no recent trauma. DERMATOLOGIC: No rash, no itching, no lesions. ENDOCRINE: No polyuria,  polydipsia, no heat or cold intolerance. No recent change in weight. HEMATOLOGICAL: No anemia or easy bruising or bleeding. NEUROLOGIC: No headache, seizures, numbness, tingling or weakness. PSYCHIATRIC: No depression, no loss of interest in normal activity or change in sleep pattern.     Exam: chaperone present  BP 124/70  Ht 5\' 5"  (1.651 m)  Wt 130 lb (58.968 kg)  BMI 21.63 kg/m2  Body mass index is 21.63 kg/(m^2).  General appearance : Well developed well nourished female. No acute distress HEENT: Neck supple, trachea midline, no carotid bruits, no thyroidmegaly Lungs: Clear to auscultation, no rhonchi or wheezes, or rib retractions  Heart: Regular rate and rhythm, no murmurs or gallops Breast:Examined in sitting and supine position were symmetrical in appearance, no palpable masses or tenderness,  no skin retraction, no nipple inversion, no nipple discharge, no skin discoloration, no axillary or supraclavicular lymphadenopathy Abdomen: no palpable masses or tenderness, no rebound or guarding Extremities: no edema or skin discoloration or tenderness  Pelvic:  Bartholin, Urethra, Skene Glands: Within normal limits             Vagina: No gross lesions or discharge  Cervix: No gross lesions or discharge  Uterus  anteverted, normal size, shape and consistency, non-tender and mobile  Adnexa  Without masses or tenderness  Anus and perineum  normal   Rectovaginal  normal sphincter tone without palpated masses or tenderness             Hemoccult none indicated  Assessment/Plan:  37 y.o. female for annual exam with past history of endometriosis doing well on continuous low-dose oral contraceptive pill Junel 1/20. Pap smear not done today and new guidelines were discussed. The following labs were ordered today: CBC, screen cholesterol, hemoglobin A1c, TSH, and urinalysis. Patient received a Tdap vaccine today and literature and information was provided. She was reminded to do her monthly  self breast examination. We discussed importance of calcium and vitamin D for osteoporosis prevention.    Ok Edwards MD, 4:35 PM 10/08/2012

## 2012-10-09 ENCOUNTER — Other Ambulatory Visit: Payer: Self-pay | Admitting: Gynecology

## 2012-10-09 ENCOUNTER — Other Ambulatory Visit: Payer: Self-pay

## 2012-10-09 DIAGNOSIS — E7800 Pure hypercholesterolemia, unspecified: Secondary | ICD-10-CM

## 2012-10-09 DIAGNOSIS — Z1231 Encounter for screening mammogram for malignant neoplasm of breast: Secondary | ICD-10-CM

## 2012-10-09 LAB — HEMOGLOBIN A1C: Mean Plasma Glucose: 103 mg/dL (ref <117)

## 2012-10-09 LAB — CBC WITH DIFFERENTIAL/PLATELET
Basophils Absolute: 0 10*3/uL (ref 0.0–0.1)
HCT: 41.7 % (ref 36.0–46.0)
Lymphocytes Relative: 29 % (ref 12–46)
Lymphs Abs: 1.9 10*3/uL (ref 0.7–4.0)
MCV: 86.7 fL (ref 78.0–100.0)
Neutro Abs: 3.9 10*3/uL (ref 1.7–7.7)
Platelets: 214 10*3/uL (ref 150–400)
RBC: 4.81 MIL/uL (ref 3.87–5.11)
RDW: 12.8 % (ref 11.5–15.5)
WBC: 6.5 10*3/uL (ref 4.0–10.5)

## 2012-10-09 LAB — URINALYSIS W MICROSCOPIC + REFLEX CULTURE
Hgb urine dipstick: NEGATIVE
Nitrite: NEGATIVE
Protein, ur: NEGATIVE mg/dL
Urobilinogen, UA: 0.2 mg/dL (ref 0.0–1.0)

## 2012-10-09 LAB — TSH: TSH: 0.113 u[IU]/mL — ABNORMAL LOW (ref 0.350–4.500)

## 2012-10-09 LAB — CHOLESTEROL, TOTAL: Cholesterol: 222 mg/dL — ABNORMAL HIGH (ref 0–200)

## 2012-10-10 ENCOUNTER — Encounter: Payer: Self-pay | Admitting: Gynecology

## 2012-10-12 LAB — URINE CULTURE: Colony Count: 40000

## 2012-10-13 ENCOUNTER — Other Ambulatory Visit: Payer: Self-pay | Admitting: Gynecology

## 2012-10-13 MED ORDER — NITROFURANTOIN MONOHYD MACRO 100 MG PO CAPS: 100.0000 mg | ORAL_CAPSULE | Freq: Two times a day (BID) | ORAL | Status: DC

## 2012-10-17 ENCOUNTER — Encounter: Payer: Self-pay | Admitting: Obstetrics and Gynecology

## 2012-10-28 ENCOUNTER — Ambulatory Visit
Admission: RE | Admit: 2012-10-28 | Discharge: 2012-10-28 | Disposition: A | Discharge status: Home / Self Care | Payer: PRIVATE HEALTH INSURANCE | Source: Ambulatory Visit

## 2012-10-28 DIAGNOSIS — Z1231 Encounter for screening mammogram for malignant neoplasm of breast: Secondary | ICD-10-CM

## 2012-12-23 ENCOUNTER — Telehealth: Payer: Self-pay | Admitting: *Deleted

## 2012-12-23 NOTE — Telephone Encounter (Signed)
Pt called requesting recent copy of blood work on OV 10/08/12, faxed to pt.

## 2013-03-20 ENCOUNTER — Encounter: Payer: Self-pay | Admitting: Gynecology

## 2013-03-20 ENCOUNTER — Telehealth: Payer: Self-pay | Admitting: *Deleted

## 2013-03-20 ENCOUNTER — Ambulatory Visit (INDEPENDENT_AMBULATORY_CARE_PROVIDER_SITE_OTHER): Payer: PRIVATE HEALTH INSURANCE | Admitting: Gynecology

## 2013-03-20 DIAGNOSIS — R350 Frequency of micturition: Secondary | ICD-10-CM

## 2013-03-20 DIAGNOSIS — R102 Pelvic and perineal pain: Secondary | ICD-10-CM

## 2013-03-20 DIAGNOSIS — N949 Unspecified condition associated with female genital organs and menstrual cycle: Secondary | ICD-10-CM

## 2013-03-20 LAB — URINALYSIS W MICROSCOPIC + REFLEX CULTURE
Bilirubin Urine: NEGATIVE
Glucose, UA: NEGATIVE mg/dL
Hgb urine dipstick: NEGATIVE
KETONES UR: NEGATIVE mg/dL
Leukocytes, UA: NEGATIVE
NITRITE: NEGATIVE
Protein, ur: NEGATIVE mg/dL
SPECIFIC GRAVITY, URINE: 1.02 (ref 1.005–1.030)
UROBILINOGEN UA: 0.2 mg/dL (ref 0.0–1.0)
pH: 7 (ref 5.0–8.0)

## 2013-03-20 LAB — WET PREP FOR TRICH, YEAST, CLUE
Clue Cells Wet Prep HPF POC: NONE SEEN
TRICH WET PREP: NONE SEEN

## 2013-03-20 MED ORDER — FLUCONAZOLE 150 MG PO TABS: 150.0000 mg | ORAL_TABLET | Freq: Once | ORAL | Status: DC

## 2013-03-20 NOTE — Telephone Encounter (Signed)
Pt informed with the below note. 

## 2013-03-20 NOTE — Telephone Encounter (Signed)
Message copied by Thamas Jaegers on Fri Mar 20, 2013  3:10 PM ------      Message from: Terrance Mass      Created: Fri Mar 20, 2013  2:44 PM       Anderson Malta, please schedule appointment with this patient with urology for her suspected interstitial cystitis. ------

## 2013-03-20 NOTE — Progress Notes (Signed)
   Patient is a 38 year old gravida 2 para 2 who presented to the office today complaining of urinary frequency without dysuria and without nocturia. She denies any stress urinary incontinence. She denies any vaginal discharge but wanted to be checked for BV and yeast. She denies any fever, chills, nausea, vomiting or back pain. Patient 2002 was diagnosed with endometriosis and treated and since then has done well on continuous oral contraceptive pill. She denies any dyspareunia. She has a history of Hashimoto's thyroiditis and has been followed by the endocrinologist. Patient states that her bladder becomes tender as it becomes full and is relieved after voiding.  Exam: Back: No CVA tenderness Abdomen: Soft nontender no rebound or guarding Pelvic: Bartholin urethra Skene was within normal limits Vagina: No lesions or discharge Cervix: No lesions or discharge Bimanual exam she was tender suprapubically but not at the fundus of the uterus or in the adnexa. Rectal exam: Not done  Wet prep a few yeast were noted  The urinalysis was negative submitted for culture  Assessment/plan: Patient with signs and symptoms highly suspicious for interstitial cystitis. Patient will be referred to the urologist for further evaluation. Urine culture pending at time of this dictation. Diflucan 150 mg was prescribed for her yeast infection. Literature information on interstitial cystitis was provided.

## 2013-03-20 NOTE — Patient Instructions (Signed)

## 2013-03-20 NOTE — Telephone Encounter (Signed)
Appointment on 03/31/13 at 8:30 am with Dr.Manning. Notes faxed to office.

## 2013-03-22 LAB — URINE CULTURE
Colony Count: NO GROWTH
ORGANISM ID, BACTERIA: NO GROWTH

## 2013-04-03 ENCOUNTER — Other Ambulatory Visit: Payer: Self-pay | Admitting: Urology

## 2013-04-30 ENCOUNTER — Encounter (HOSPITAL_BASED_OUTPATIENT_CLINIC_OR_DEPARTMENT_OTHER): Payer: Self-pay | Admitting: *Deleted

## 2013-05-04 ENCOUNTER — Encounter (HOSPITAL_BASED_OUTPATIENT_CLINIC_OR_DEPARTMENT_OTHER): Payer: Self-pay | Admitting: *Deleted

## 2013-05-04 NOTE — Progress Notes (Signed)
NPO AFTER MN.  ARRIVE AT 0700.  NEEDS HG AND URINE PREG.   

## 2013-05-06 ENCOUNTER — Ambulatory Visit (HOSPITAL_BASED_OUTPATIENT_CLINIC_OR_DEPARTMENT_OTHER): Payer: PRIVATE HEALTH INSURANCE | Admitting: Anesthesiology

## 2013-05-06 ENCOUNTER — Ambulatory Visit (HOSPITAL_BASED_OUTPATIENT_CLINIC_OR_DEPARTMENT_OTHER)
Admission: RE | Admit: 2013-05-06 | Discharge: 2013-05-06 | Disposition: A | Discharge status: Home / Self Care | Payer: PRIVATE HEALTH INSURANCE | Source: Ambulatory Visit | Attending: Urology | Admitting: Urology

## 2013-05-06 ENCOUNTER — Encounter (HOSPITAL_BASED_OUTPATIENT_CLINIC_OR_DEPARTMENT_OTHER): Payer: PRIVATE HEALTH INSURANCE | Admitting: Anesthesiology

## 2013-05-06 ENCOUNTER — Encounter (HOSPITAL_BASED_OUTPATIENT_CLINIC_OR_DEPARTMENT_OTHER): Payer: Self-pay

## 2013-05-06 ENCOUNTER — Encounter (HOSPITAL_BASED_OUTPATIENT_CLINIC_OR_DEPARTMENT_OTHER)
Admission: RE | Disposition: A | Discharge status: Home / Self Care | Payer: Self-pay | Source: Ambulatory Visit | Attending: Urology

## 2013-05-06 DIAGNOSIS — N949 Unspecified condition associated with female genital organs and menstrual cycle: Secondary | ICD-10-CM | POA: Insufficient documentation

## 2013-05-06 DIAGNOSIS — N809 Endometriosis, unspecified: Secondary | ICD-10-CM | POA: Insufficient documentation

## 2013-05-06 DIAGNOSIS — K219 Gastro-esophageal reflux disease without esophagitis: Secondary | ICD-10-CM | POA: Insufficient documentation

## 2013-05-06 DIAGNOSIS — R300 Dysuria: Secondary | ICD-10-CM | POA: Insufficient documentation

## 2013-05-06 DIAGNOSIS — N3010 Interstitial cystitis (chronic) without hematuria: Secondary | ICD-10-CM | POA: Insufficient documentation

## 2013-05-06 DIAGNOSIS — E063 Autoimmune thyroiditis: Secondary | ICD-10-CM | POA: Insufficient documentation

## 2013-05-06 DIAGNOSIS — M26609 Unspecified temporomandibular joint disorder, unspecified side: Secondary | ICD-10-CM | POA: Insufficient documentation

## 2013-05-06 HISTORY — DX: Nocturia: R35.1

## 2013-05-06 HISTORY — DX: Hypothyroidism, unspecified: E03.9

## 2013-05-06 HISTORY — DX: Gastro-esophageal reflux disease without esophagitis: K21.9

## 2013-05-06 HISTORY — PX: CYSTO WITH HYDRODISTENSION: SHX5453

## 2013-05-06 HISTORY — DX: Personal history of other diseases of the musculoskeletal system and connective tissue: Z87.39

## 2013-05-06 HISTORY — DX: Personal history of cervical dysplasia: Z87.410

## 2013-05-06 HISTORY — DX: Nausea with vomiting, unspecified: R11.2

## 2013-05-06 HISTORY — DX: Other symptoms and signs involving the genitourinary system: R39.89

## 2013-05-06 HISTORY — DX: Pelvic and perineal pain: R10.2

## 2013-05-06 HISTORY — DX: Other specified postprocedural states: Z98.890

## 2013-05-06 HISTORY — DX: Frequency of micturition: R35.0

## 2013-05-06 LAB — POCT I-STAT, CHEM 8
BUN: 8 mg/dL (ref 6–23)
CHLORIDE: 104 meq/L (ref 96–112)
Calcium, Ion: 1.17 mmol/L (ref 1.12–1.23)
Creatinine, Ser: 0.8 mg/dL (ref 0.50–1.10)
Glucose, Bld: 89 mg/dL (ref 70–99)
HEMATOCRIT: 43 % (ref 36.0–46.0)
Hemoglobin: 14.6 g/dL (ref 12.0–15.0)
POTASSIUM: 3.9 meq/L (ref 3.7–5.3)
Sodium: 142 mEq/L (ref 137–147)
TCO2: 26 mmol/L (ref 0–100)

## 2013-05-06 LAB — POCT PREGNANCY, URINE: PREG TEST UR: NEGATIVE

## 2013-05-06 SURGERY — CYSTOSCOPY, WITH BLADDER HYDRODISTENSION
Anesthesia: General | Site: Bladder

## 2013-05-06 MED ORDER — ONDANSETRON HCL 4 MG/2ML IJ SOLN
INTRAMUSCULAR | Status: DC | PRN
Start: 2013-05-06 — End: 2013-05-06
  Administered 2013-05-06: 4 mg via INTRAVENOUS

## 2013-05-06 MED ORDER — BUPIVACAINE HCL 0.5 % IJ SOLN
INTRAMUSCULAR | Status: DC | PRN
Start: 2013-05-06 — End: 2013-05-06
  Administered 2013-05-06: 17 mL

## 2013-05-06 MED ORDER — FENTANYL CITRATE 0.05 MG/ML IJ SOLN
25.0000 ug | INTRAMUSCULAR | Status: DC | PRN
  Filled 2013-05-06: qty 1

## 2013-05-06 MED ORDER — STERILE WATER FOR IRRIGATION IR SOLN
Status: DC | PRN
  Administered 2013-05-06: 3000 mL

## 2013-05-06 MED ORDER — LACTATED RINGERS IV SOLN
INTRAVENOUS | Status: DC
Start: 2013-05-06 — End: 2013-05-06
  Administered 2013-05-06: 07:00:00 via INTRAVENOUS
  Filled 2013-05-06: qty 1000

## 2013-05-06 MED ORDER — MIDAZOLAM HCL 5 MG/5ML IJ SOLN
INTRAMUSCULAR | Status: DC | PRN
  Administered 2013-05-06: 2 mg via INTRAVENOUS

## 2013-05-06 MED ORDER — FENTANYL CITRATE 0.05 MG/ML IJ SOLN
INTRAMUSCULAR | Status: DC | PRN
  Administered 2013-05-06 (×3): 25 ug via INTRAVENOUS

## 2013-05-06 MED ORDER — PROMETHAZINE HCL 25 MG/ML IJ SOLN
6.2500 mg | INTRAMUSCULAR | Status: DC | PRN
  Filled 2013-05-06: qty 1

## 2013-05-06 MED ORDER — TRAMADOL HCL 50 MG PO TABS: 50.0000 mg | ORAL_TABLET | Freq: Four times a day (QID) | ORAL | Status: DC | PRN

## 2013-05-06 MED ORDER — LIDOCAINE HCL (CARDIAC) 20 MG/ML IV SOLN
INTRAVENOUS | Status: DC | PRN
  Administered 2013-05-06: 80 mg via INTRAVENOUS

## 2013-05-06 MED ORDER — LACTATED RINGERS IV SOLN
INTRAVENOUS | Status: DC | PRN
  Administered 2013-05-06: 07:00:00 via INTRAVENOUS

## 2013-05-06 MED ORDER — PROPOFOL 10 MG/ML IV BOLUS
INTRAVENOUS | Status: DC | PRN
  Administered 2013-05-06: 160 mg via INTRAVENOUS

## 2013-05-06 MED ORDER — FENTANYL CITRATE 0.05 MG/ML IJ SOLN
INTRAMUSCULAR | Status: AC
  Filled 2013-05-06: qty 4

## 2013-05-06 MED ORDER — DEXAMETHASONE SODIUM PHOSPHATE 4 MG/ML IJ SOLN
INTRAMUSCULAR | Status: DC | PRN
  Administered 2013-05-06: 4 mg via INTRAVENOUS

## 2013-05-06 MED ORDER — GENTAMICIN SULFATE 40 MG/ML IJ SOLN
300.0000 mg | Freq: Once | INTRAVENOUS | Status: DC
  Filled 2013-05-06: qty 7.5

## 2013-05-06 MED ORDER — SENNOSIDES-DOCUSATE SODIUM 8.6-50 MG PO TABS: 1.0000 | ORAL_TABLET | Freq: Two times a day (BID) | ORAL | Status: DC

## 2013-05-06 MED ORDER — KETOROLAC TROMETHAMINE 30 MG/ML IJ SOLN
INTRAMUSCULAR | Status: DC | PRN
  Administered 2013-05-06: 30 mg via INTRAVENOUS

## 2013-05-06 MED ORDER — MIDAZOLAM HCL 2 MG/2ML IJ SOLN
INTRAMUSCULAR | Status: AC
  Filled 2013-05-06: qty 2

## 2013-05-06 MED ORDER — DEXTROSE 5 % IV SOLN
89.4000 mg | INTRAVENOUS | Status: DC | PRN
  Administered 2013-05-06: 300 mg via INTRAVENOUS

## 2013-05-06 MED ORDER — GENTAMICIN IN SALINE 1.6-0.9 MG/ML-% IV SOLN
80.0000 mg | INTRAVENOUS | Status: DC
  Filled 2013-05-06: qty 50

## 2013-05-06 SURGICAL SUPPLY — 22 items
BAG URO CATCHER STRL LF (DRAPE) ×2 IMPLANT
BALLN NEPHROSTOMY (BALLOONS)
BALLOON NEPHROSTOMY (BALLOONS) IMPLANT
CATH ROBINSON RED A/P 14FR (CATHETERS) IMPLANT
CLOTH BEACON ORANGE TIMEOUT ST (SAFETY) ×2 IMPLANT
DRAPE CAMERA CLOSED 9X96 (DRAPES) ×2 IMPLANT
ELECT REM PT RETURN 9FT ADLT (ELECTROSURGICAL)
ELECTRODE REM PT RTRN 9FT ADLT (ELECTROSURGICAL) ×1 IMPLANT
GLOVE BIO SURGEON STRL SZ 6.5 (GLOVE) ×1 IMPLANT
GLOVE BIO SURGEON STRL SZ7.5 (GLOVE) ×2 IMPLANT
GLOVE INDICATOR 7.0 STRL GRN (GLOVE) ×1 IMPLANT
GOWN PREVENTION PLUS LG XLONG (DISPOSABLE) ×1 IMPLANT
GOWN STRL NON-REIN LRG LVL3 (GOWN DISPOSABLE) ×2 IMPLANT
GOWN STRL REUS W/ TWL LRG LVL3 (GOWN DISPOSABLE) IMPLANT
GOWN STRL REUS W/ TWL XL LVL3 (GOWN DISPOSABLE) IMPLANT
GOWN STRL REUS W/TWL LRG LVL3 (GOWN DISPOSABLE) ×2
GOWN STRL REUS W/TWL XL LVL3 (GOWN DISPOSABLE) ×2
NDL HYPO 18GX1.5 BLUNT FILL (NEEDLE) IMPLANT
NEEDLE HYPO 18GX1.5 BLUNT FILL (NEEDLE) ×2 IMPLANT
PACK CYSTOSCOPY (CUSTOM PROCEDURE TRAY) ×2 IMPLANT
SYR 20CC LL (SYRINGE) ×1 IMPLANT
WATER STERILE IRR 3000ML UROMA (IV SOLUTION) ×2 IMPLANT

## 2013-05-06 NOTE — Transfer of Care (Signed)
Immediate Anesthesia Transfer of Care Note  Patient: Terri Hoover  Procedure(s) Performed: Procedure(s) (LRB): CYSTOSCOPY/HYDRODISTENSION WITH TRIGGER POINT INJECTION  (N/A)  Patient Location: PACU  Anesthesia Type: General  Level of Consciousness: awake, sedated, patient cooperative and responds to stimulation  Airway & Oxygen Therapy: Patient Spontanous Breathing and Patient connected to face mask oxygen  Post-op Assessment: Report given to PACU RN, Post -op Vital signs reviewed and stable and Patient moving all extremities  Post vital signs: Reviewed and stable  Complications: No apparent anesthesia complications

## 2013-05-06 NOTE — Anesthesia Procedure Notes (Signed)
Procedure Name: LMA Insertion Date/Time: 05/06/2013 8:31 AM Performed by: Justice Rocher Pre-anesthesia Checklist: Patient identified, Emergency Drugs available, Suction available and Patient being monitored Patient Re-evaluated:Patient Re-evaluated prior to inductionOxygen Delivery Method: Circle System Utilized Preoxygenation: Pre-oxygenation with 100% oxygen Intubation Type: IV induction Ventilation: Mask ventilation without difficulty LMA: LMA inserted LMA Size: 4.0 Number of attempts: 1 Airway Equipment and Method: bite block Placement Confirmation: positive ETCO2 Tube secured with: Tape Dental Injury: Teeth and Oropharynx as per pre-operative assessment

## 2013-05-06 NOTE — Anesthesia Postprocedure Evaluation (Signed)
  Anesthesia Post-op Note  Patient: Terri Hoover  Procedure(s) Performed: Procedure(s) (LRB): CYSTOSCOPY/HYDRODISTENSION WITH TRIGGER POINT INJECTION  (N/A)  Patient Location: PACU  Anesthesia Type: General  Level of Consciousness: awake and alert   Airway and Oxygen Therapy: Patient Spontanous Breathing  Post-op Pain: mild  Post-op Assessment: Post-op Vital signs reviewed, Patient's Cardiovascular Status Stable, Respiratory Function Stable, Patent Airway and No signs of Nausea or vomiting  Last Vitals:  Filed Vitals:   05/06/13 1015  BP: 124/73  Pulse: 94  Temp:   Resp: 16    Post-op Vital Signs: stable   Complications: No apparent anesthesia complications

## 2013-05-06 NOTE — Brief Op Note (Signed)
05/06/2013  8:43 AM  PATIENT:  Terri Hoover  38 y.o. female  PRE-OPERATIVE DIAGNOSIS:  INTERSTITIAL CYSTITIS  POST-OPERATIVE DIAGNOSIS:  INTERSTITIAL CYSTITIS  PROCEDURE:  Procedure(s): CYSTOSCOPY/HYDRODISTENSION WITH TRIGGER POINT INJECTION  (N/A)  SURGEON:  Surgeon(s) and Role:    * Alexis Frock, MD - Primary  PHYSICIAN ASSISTANT:   ASSISTANTS: none   ANESTHESIA:   local and general  EBL:  Total I/O In: 100 [I.V.:100] Out: -   BLOOD ADMINISTERED:none  DRAINS: none   LOCAL MEDICATIONS USED:  MARCAINE     SPECIMEN:  Source of Specimen:  none  DISPOSITION OF SPECIMEN:  N/A  COUNTS:  YES  TOURNIQUET:  * No tourniquets in log *  DICTATION: .Other Dictation: Dictation Number [id number]  PLAN OF CARE: Discharge to home after PACU  PATIENT DISPOSITION:  PACU - hemodynamically stable.   Delay start of Pharmacological VTE agent (>24hrs) due to surgical blood loss or risk of bleeding: not applicable

## 2013-05-06 NOTE — Anesthesia Preprocedure Evaluation (Signed)
Anesthesia Evaluation  Patient identified by MRN, date of birth, ID band Patient awake    Reviewed: Allergy & Precautions, H&P , NPO status , Patient's Chart, lab work & pertinent test results  History of Anesthesia Complications (+) PONV and history of anesthetic complications  Airway Mallampati: II TM Distance: >3 FB Neck ROM: Full   Comment: TMJ Dental no notable dental hx.    Pulmonary neg pulmonary ROS,  breath sounds clear to auscultation  Pulmonary exam normal       Cardiovascular Exercise Tolerance: Good negative cardio ROS  Rhythm:Regular Rate:Normal     Neuro/Psych negative neurological ROS  negative psych ROS   GI/Hepatic Neg liver ROS, GERD-  ,  Endo/Other  Hypothyroidism   Renal/GU negative Renal ROS  negative genitourinary   Musculoskeletal negative musculoskeletal ROS (+)   Abdominal   Peds negative pediatric ROS (+)  Hematology negative hematology ROS (+)   Anesthesia Other Findings   Reproductive/Obstetrics negative OB ROS                           Anesthesia Physical Anesthesia Plan  ASA: I  Anesthesia Plan: General   Post-op Pain Management:    Induction: Intravenous  Airway Management Planned: LMA  Additional Equipment:   Intra-op Plan:   Post-operative Plan: Extubation in OR  Informed Consent: I have reviewed the patients History and Physical, chart, labs and discussed the procedure including the risks, benefits and alternatives for the proposed anesthesia with the patient or authorized representative who has indicated his/her understanding and acceptance.   Dental advisory given  Plan Discussed with: CRNA  Anesthesia Plan Comments:         Anesthesia Quick Evaluation

## 2013-05-06 NOTE — H&P (Signed)
Terri Hoover is an 38 y.o. female.    Chief Complaint: Pre-OP Cystoscopy with Hydrodistension  HPI:   1 - Dysuria, Female Pelvic Pain - Two years of on and off bothersome pelvic pain and irritative voiding symptoms with frequency / urgency.  No incontinence. No gross hematuria. Non-smoker. She has h/o endometriosis per diagnostic laparoscopy 2002. Pelvic 03/2013 with focal tenderness over bladder and some left paravaginal. PVR 03/2013 "130mL"  PMH sig for infertility (treated, now has 2 healthy children), endometriosis ("severe" per prior GYN), Hashimoto's thyroiditis. No CV disease, No strong blood thinners. She is an MRI tech.  Today Gregory is seen to proceed with cystoscopy, hydrodistension, and trigger point injection for diagnostic and theraputic intent. No interval hematuria or fevers. Most recent UA without infectious parameters.  Past Medical History  Diagnosis Date  . Endometriosis   . Hashimoto's disease   . History of cervical dysplasia     CIN I  LOW GRADE 2011  . Pelvic pain in female   . Frequency of urination   . Hypothyroidism   . Sensation of pressure in bladder area   . Nocturia   . History of TMJ disorder   . Mild acid reflux     NO MEDS  . PONV (postoperative nausea and vomiting)     Past Surgical History  Procedure Laterality Date  . Colposcopy  2000  . Dilation and curettage of uterus  09-04-2002    W/  SUCTION FOR POSTPARTUM RETAINED PRODUCTS  . Temporomandibular joint surgery  2000  . Dx laparoscopy /  laser ablation endometriosis  2002    Family History  Problem Relation Age of Onset  . Heart disease Father   . Breast cancer Maternal Aunt 35  . Breast cancer Maternal Grandmother     Age 17   Social History:  reports that she has never smoked. She has never used smokeless tobacco. She reports that she does not drink alcohol or use illicit drugs.  Allergies: No Known Allergies  No prescriptions prior to admission    No results found for this or  any previous visit (from the past 48 hour(s)). No results found.  Review of Systems  Constitutional: Negative.  Negative for fever and chills.  HENT: Negative.   Eyes: Negative.   Respiratory: Negative.   Cardiovascular: Negative.   Gastrointestinal: Negative.   Genitourinary: Positive for dysuria, urgency and frequency. Negative for hematuria and flank pain.  Musculoskeletal: Negative.   Skin: Negative.   Neurological: Negative.   Endo/Heme/Allergies: Negative.   Psychiatric/Behavioral: Negative.     Height 5\' 5"  (1.651 m), weight 58.06 kg (128 lb), last menstrual period 03/04/2013. Physical Exam  Constitutional: She is oriented to person, place, and time. She appears well-developed and well-nourished.  HENT:  Head: Normocephalic and atraumatic.  Eyes: EOM are normal. Pupils are equal, round, and reactive to light.  Neck: Normal range of motion. Neck supple.  Cardiovascular: Normal rate and regular rhythm.   Respiratory: Effort normal and breath sounds normal.  GI: Soft. Bowel sounds are normal.  Genitourinary:  No CVAT  Musculoskeletal: Normal range of motion.  Neurological: She is alert and oriented to person, place, and time.  Skin: Skin is warm and dry.  Psychiatric: She has a normal mood and affect. Her behavior is normal. Judgment and thought content normal.     Assessment/Plan  1 - Dysuria, Female Pelvic Pain - Likely interstitial cystitis, endometriosis with or without bladder implants also possible.   We rediscussed the  potential diagnostic and therapeutic roles of hydrodistension in detail. We rementioned that it can help rule-out other causes of urinary symptoms and help confirm interstitial cystitis as well help with risk stratification and prognosis based on cystometric capacity and presence / absence of Hunner's lesions. We discussed that hydrodistention will often cause a transient worsening of symptoms which is then followed by a "quiescent" period of  significant symptom improvement in some patients, and that this therapeutic benefit can sometimes last for months or more. We mentioned that ablation / resection of Hunner's lesions, if present, can help symptoms. We then rediscussed risks including bleeding, infection, and specifically discussed bladder perforation which possible and usually treated by catheter drainage unless severe. Finally we rediscussed anesthetic risks as well including DVT, PE, MI, and mortality. Will also consider paravaginal trigger point injection if cysto unremarkable as she has some significant tenderness in this area especially on the left.   Adrieanna Boteler 05/06/2013, 6:06 AM

## 2013-05-06 NOTE — Discharge Instructions (Addendum)
1 - You may have urinary urgency (bladder spasms) and bloody urine on / off x few days. This is normal.  2 - Call MD or go to ER for fever >102, severe pain / nausea / vomiting not relieved by medications, or acute change in medical status   Post Anesthesia Home Care Instructions  Activity: Get plenty of rest for the remainder of the day. A responsible adult should stay with you for 24 hours following the procedure.  For the next 24 hours, DO NOT: -Drive a car -Paediatric nurse -Drink alcoholic beverages -Take any medication unless instructed by your physician -Make any legal decisions or sign important papers.  Meals: Start with liquid foods such as gelatin or soup. Progress to regular foods as tolerated. Avoid greasy, spicy, heavy foods. If nausea and/or vomiting occur, drink only clear liquids until the nausea and/or vomiting subsides. Call your physician if vomiting continues.  Special Instructions/Symptoms: Your throat may feel dry or sore from the anesthesia or the breathing tube placed in your throat during surgery. If this causes discomfort, gargle with warm salt water. The discomfort should disappear within 24 hours.

## 2013-05-07 ENCOUNTER — Encounter (HOSPITAL_BASED_OUTPATIENT_CLINIC_OR_DEPARTMENT_OTHER): Payer: Self-pay | Admitting: Urology

## 2013-05-07 NOTE — Op Note (Signed)
Terri Hoover, HAST NO.:  [id number]  MEDICAL RECORD NO.:  [id number]  LOCATION:                                 FACILITY:  PHYSICIAN:  Alexis Frock, MD     DATE OF BIRTH:  [birth year]  DATE OF PROCEDURE:  05/06/2013 DATE OF DISCHARGE:                              OPERATIVE REPORT   PREOPERATIVE DIAGNOSES:  Chronic pelvic pain, irritative voiding symptoms, left paravaginal pain.  POSTOPERATIVE DIAGNOSIS:  Interstitial cystitis.  PROCEDURES: 1. Cystoscopy with hydrodistention. 2. Trigger point injection.  FINDINGS: 1. Moderate-to-severe glomerulations post-hydrodistention mostly in     the trigone area, right wall, and dome, unremarkable pre-     distention. 2. A 600 mL cystometric capacity.  ESTIMATED BLOOD LOSS:  Nil.  COMPLICATIONS:  None.  SPECIMENS:  None.  INDICATION:  Terri Hoover is a very pleasant 38 year old lady with a long history of pelvic pain as well as several autoimmune spectrum phenomena including Hashimoto disease.  She was evaluated recently with increasing dysuria and pelvic pain, worrisome for possible interstitial cystitis. On exam, she was quite tender over her bladder as well as her left paravaginal area.  Options were discussed including trial of empiric medical therapy versus cystoscopy with hydrodistention with diagnostic and therapeutic intent with or without trigger point injection, and she wished to proceed with this procedure.  Informed consent was obtained and placed in medical record.  PROCEDURE IN DETAIL:  The patient being Terri Hoover verified. Procedure being cystoscopy with hydrodistention and trigger point injection was confirmed.  Procedure was carried out.  Time-out was performed.  Intravenous antibiotics were administered.  General LMA anesthesia was introduced.  The patient was placed into a low-lithotomy position, and sterile field was created by prepping and draping the patient's vagina, introitus,  and proximal thighs using iodine x3.  Next, cystourethroscopy was performed using a 22-French rigid cystoscope with 12-degree offset lens.  Inspection of urinary bladder revealed no diverticula, calcifications, papular lesions.  Ureteral orifices were in normal anatomic position with efflux of clear urine bilaterally.  The bladder was slowly distended.  The cystoscope with normal saline to a pressure of approximately 16 cm of water and held for 60 seconds.  This was performed using continuous visualization of the urinary bladder to verify no perforation and this did not occur.  Bladder was then emptied and cystometric capacity was found to be 600 mL.  Repeat cystoscopy following hydrodistention revealed multifocal glomerulations most concentrated at the inter-trigone area, right bladder neck, and dome. There was no evidence of perforation.  There was slight oozing at the site as expected, but without significant blood or Hunner ulcer, it was not felt that coagulation was warranted.  Bladder was then re-emptied per cystoscope.  Attention was then directed at trigger point injection. Approximately 15 mL of 0.5% plain Marcaine was infiltrated in the left paravaginal area corresponding to the patient's previous area of left tenderness after performing aspiration to verify and no direct injections into blood vessels.  Additional 5 mL was placed in right paravaginal orientation in a similar fashion.  Procedure was then terminated.  The patient tolerated the procedure well.  There were no immediate periprocedural complications.  The patient was taken to postanesthesia care unit in a stable condition.          ______________________________ Alexis Frock, MD     TM/MEDQ  D:  05/06/2013  T:  05/07/2013  Job:  [id number]

## 2013-07-30 ENCOUNTER — Other Ambulatory Visit: Payer: Self-pay | Admitting: Gynecology

## 2013-10-13 ENCOUNTER — Other Ambulatory Visit: Payer: Self-pay | Admitting: Gynecology

## 2013-10-27 ENCOUNTER — Encounter: Payer: PRIVATE HEALTH INSURANCE | Admitting: Gynecology

## 2013-11-04 ENCOUNTER — Other Ambulatory Visit: Payer: Self-pay | Admitting: Gynecology

## 2013-11-26 ENCOUNTER — Encounter: Payer: Self-pay | Admitting: Gynecology

## 2013-11-26 ENCOUNTER — Ambulatory Visit (INDEPENDENT_AMBULATORY_CARE_PROVIDER_SITE_OTHER): Payer: PRIVATE HEALTH INSURANCE | Admitting: Gynecology

## 2013-11-26 VITALS — BP 122/80 | Ht 65.0 in | Wt 132.0 lb

## 2013-11-26 DIAGNOSIS — N3281 Overactive bladder: Secondary | ICD-10-CM

## 2013-11-26 DIAGNOSIS — Z01419 Encounter for gynecological examination (general) (routine) without abnormal findings: Secondary | ICD-10-CM

## 2013-11-26 DIAGNOSIS — Z803 Family history of malignant neoplasm of breast: Secondary | ICD-10-CM

## 2013-11-26 MED ORDER — NORETHIN ACE-ETH ESTRAD-FE 1-20 MG-MCG PO TABS: 1.0000 | ORAL_TABLET | Freq: Every evening | ORAL | Status: DC

## 2013-11-26 MED ORDER — MIRABEGRON ER 25 MG PO TB24: 25.0000 mg | ORAL_TABLET | Freq: Every day | ORAL | Status: DC

## 2013-11-26 NOTE — Patient Instructions (Signed)
BRCA-1 and BRCA-2 BRCA-1 and BRCA-2 are 2 genes that are linked with hereditary breast and ovarian cancers. About 200,000 women are diagnosed with invasive breast cancer each year and about 23,000 with ovarian cancer (according to the American Cancer Society). Of these cancers, about 5% to 10% will be due to a mutation in one of the BRCA genes. Men can also inherit an increased risk of developing breast cancer, primarily from an alteration in the BRCA-2 gene.  Individuals with mutations in BRCA1 or BRCA2 have significantly elevated risks for breast cancer (up to 80% lifetime risk), ovarian cancer (up to 40% lifetime risk), bilateral breast cancer and other types of cancers. BRCA mutations are inherited and passed from generation to generation. One half of the time, they are passed from the father's side of the family.  The DNA in white blood cells is used to detect mutations in the BRCA genes. While the gene products (proteins) of the BRCA genes act only in breast and ovarian tissue, the genes are present in every cell of the body and blood is the most easily accessible source of that DNA. PREPARATION FOR TEST The test for BRCA mutations is done on a blood sample collected by needle from a vein in the arm. The test does not require surgical biopsy of breast or ovarian tissue.  NORMAL FINDINGS No genetic mutations. Ranges for normal findings may vary among different laboratories and hospitals. You should always check with your doctor after having lab work or other tests done to discuss the meaning of your test results and whether your values are considered within normal limits. MEANING OF TEST  Your caregiver will go over the test results with you and discuss the importance and meaning of your results, as well as treatment options and the need for additional tests if necessary. OBTAINING THE TEST RESULTS It is your responsibility to obtain your test results. Ask the lab or department performing the test  when and how you will get your results. OTHER THINGS TO KNOW Your test results may have implications for other family members. When one member of a family is tested for BRCA mutations, issues often arise about how or whether to share this information with other family members. Seek advice from a genetic counselor about communication of result with your family members.  Pre and post test consultation with a health care provider knowledgeable about genetic testing cannot be overemphasized.  There are many issues to be considered when preparing for a genetic test and upon learning the results, and a genetic counselor has the knowledge and experience to help you sort through them.  If the BRCA test is positive, the options include increased frequency of check-ups (e.g., mammography, blood tests for CA-125, or transvaginal ultrasonography); medications that could reduce risk (e.g., oral contraceptives or tamoxifen); or surgical removal of the ovaries or breasts. There are a number of variables involved and it is important to discuss your options with your doctor and genetic counselor. Research studies have reported that for every 1000 women negative for BRCA mutations, between 12 and 45 of them will develop breast cancer by age 50 and between 3 and 4 will develop ovarian cancer by age 50. The risk increases with age. The test can be ordered by a doctor, preferably by one who can also offer genetic counseling. The blood sample will be sent to a laboratory that specializes in BRCA testing. The American Society of Clinical Oncology and the National Breast Cancer Coalition encourage women seeking the   test to participate in long-term outcome studies to help gather information on the effectiveness of different check-up and treatment options. Document Released: 03/08/2004 Document Revised: 05/07/2011 Document Reviewed: 05/15/2013 [employer] Patient Information 2015 [place], [place]. This information is not intended to  replace advice given to you by your health care provider. Make sure you discuss any questions you have with your health care provider.

## 2013-11-26 NOTE — Progress Notes (Signed)
Terri Hoover 1975/04/18 [id number]   History:    38 y.o.  for annual gyn exam with a complaint of urinary frequency and nocturia. Patient has seen a urologist in March of this year and had hydrodistention for suspected interstitial cystitis.Patient with prior laparoscopy in 2002 where endometriosis had been diagnosed. She is on continuous oral contraceptive pill. 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 She had a mammogram last year 3D report to be normal although dense. Patient does her monthly breast exam. 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 Patient has history of Hashimoto's thyroiditis and has been followed by her endocrinologist Dr. Bubba Camp who has been doing her lab work.   Past medical history,surgical history, family history and social history were all reviewed and documented in the EPIC chart.  Gynecologic History No LMP recorded. Patient is not currently having periods (Reason: Oral contraceptives). Contraception: OCP (estrogen/progesterone) Last Pap: 2013. Results were: normal Last mammogram: 2014. Results were: Normal but dense  Obstetric History OB History  Gravida Para Term Preterm AB SAB TAB Ectopic Multiple Living  2 2 2       2     # Outcome Date GA Lbr Len/2nd Weight Sex Delivery Anes PTL Lv  2 TRM           1 TRM                ROS: A ROS was performed and pertinent positives and negatives are included in the history.  GENERAL: No fevers or chills. HEENT: No change in vision, no earache, sore throat or sinus congestion. NECK: No pain or stiffness. CARDIOVASCULAR: No chest pain or pressure. No palpitations. PULMONARY: No shortness of breath, cough or wheeze. GASTROINTESTINAL: No abdominal pain, nausea, vomiting or diarrhea, melena or bright red blood per rectum. GENITOURINARY: No urinary frequency, urgency, hesitancy or dysuria.  MUSCULOSKELETAL: No joint or muscle pain, no back pain, no recent trauma. DERMATOLOGIC: No rash, no itching, no lesions. ENDOCRINE: No polyuria, polydipsia, no heat or cold intolerance. No recent change in weight. HEMATOLOGICAL: No anemia or easy bruising or bleeding. NEUROLOGIC: No headache, seizures, numbness, tingling or weakness. PSYCHIATRIC: No depression, no loss of interest in normal activity or change in sleep pattern.     Exam: chaperone present  BP 122/80  Ht 5' 5"  (1.651 m)  Wt 132 lb (59.875 kg)  BMI 21.97 kg/m2  Body mass index is 21.97 kg/(m^2).  General appearance : Well developed well nourished female. No acute distress HEENT: Neck supple, trachea midline, no carotid bruits, no thyroidmegaly Lungs: Clear to auscultation, no rhonchi or wheezes, or rib retractions  Heart: Regular rate and rhythm, no murmurs or gallops Breast:Examined in sitting and supine position were symmetrical in appearance, no palpable masses or tenderness,  no skin retraction, no nipple inversion, no nipple discharge, no skin discoloration, no axillary or supraclavicular lymphadenopathy Abdomen: no palpable masses or tenderness, no rebound or guarding Extremities: no edema or skin discoloration or tenderness  Pelvic:  Bartholin, Urethra, Skene Glands: Within normal limits             Vagina: No gross lesions or discharge  Cervix: No gross lesions or discharge  Uterus  anteverted, normal size, shape and consistency, non-tender and mobile  Adnexa  Without masses or tenderness  Anus and perineum  normal   Rectovaginal  normal sphincter tone  without palpated masses or tenderness             Hemoccult that indicated     Assessment/Plan:  38 y.o. female for annual exam with strong first-line relatives with breast cancer. Aunt had breast cancer at 41 years of age and grandmother at the age of 99 twice. Patient will be referred to the Geneticist at [facility] for [place] and BRCA2 testing.  Patient will return to the office next week in the fasting state for the following labs: Fasting lipid profile, comprehensive metabolic panel, CBC, and urinalysis. Her endocrinologist has been monitoring her thyroid function tests. Pap smear not done today according to the new guidelines. 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 She was reminded to do her monthly self breast examination. We'll obtain an MRI of the breast this year because of her strong family history of breast cancer and her history of dense breast. Patient has received the flu vaccine at the hospital.  Terrance Mass MD, 4:13 PM 11/26/2013

## 2013-11-27 ENCOUNTER — Telehealth: Payer: Self-pay | Admitting: *Deleted

## 2013-11-27 MED ORDER — CIPROFLOXACIN HCL 250 MG PO TABS
250.0000 mg | ORAL_TABLET | Freq: Two times a day (BID) | ORAL | Status: DC
Start: 2013-11-27 — End: 2014-04-13

## 2013-11-27 MED ORDER — PHENAZOPYRIDINE HCL 200 MG PO TABS: 200.0000 mg | ORAL_TABLET | Freq: Three times a day (TID) | ORAL | Status: DC | PRN

## 2013-11-27 NOTE — Telephone Encounter (Signed)
Please call and prescription for Cipro 250 mg one by mouth twice a day for 3 days #6. Also Pyridium 200 mg 1 by mouth 3 times a day for 3 days #9

## 2013-11-27 NOTE — Telephone Encounter (Signed)
Pt informed with the below note, rx sent. 

## 2013-11-27 NOTE — Telephone Encounter (Signed)
Pt was seen 11/26/13 for annual spoke to you about some urine frequency, woke up this am with burning with urination. Asked if Rx could be sent? Please advise

## 2013-12-02 ENCOUNTER — Other Ambulatory Visit: Payer: PRIVATE HEALTH INSURANCE

## 2013-12-02 DIAGNOSIS — Z01419 Encounter for gynecological examination (general) (routine) without abnormal findings: Secondary | ICD-10-CM

## 2013-12-02 LAB — CBC WITH DIFFERENTIAL/PLATELET
Basophils Absolute: 0 10*3/uL (ref 0.0–0.1)
Basophils Relative: 0 % (ref 0–1)
Eosinophils Absolute: 0.1 10*3/uL (ref 0.0–0.7)
Eosinophils Relative: 1 % (ref 0–5)
HCT: 43.3 % (ref 36.0–46.0)
HEMOGLOBIN: 14.6 g/dL (ref 12.0–15.0)
LYMPHS ABS: 1.2 10*3/uL (ref 0.7–4.0)
LYMPHS PCT: 24 % (ref 12–46)
MCH: 30.2 pg (ref 26.0–34.0)
MCHC: 33.7 g/dL (ref 30.0–36.0)
MCV: 89.5 fL (ref 78.0–100.0)
MONOS PCT: 4 % (ref 3–12)
Monocytes Absolute: 0.2 10*3/uL (ref 0.1–1.0)
NEUTROS ABS: 3.6 10*3/uL (ref 1.7–7.7)
NEUTROS PCT: 71 % (ref 43–77)
Platelets: 192 10*3/uL (ref 150–400)
RBC: 4.84 MIL/uL (ref 3.87–5.11)
RDW: 12.7 % (ref 11.5–15.5)
WBC: 5 10*3/uL (ref 4.0–10.5)

## 2013-12-03 ENCOUNTER — Other Ambulatory Visit: Payer: Self-pay | Admitting: Gynecology

## 2013-12-03 DIAGNOSIS — E7800 Pure hypercholesterolemia, unspecified: Secondary | ICD-10-CM

## 2013-12-03 LAB — URINALYSIS W MICROSCOPIC + REFLEX CULTURE
Bacteria, UA: NONE SEEN
Bilirubin Urine: NEGATIVE
CRYSTALS: NONE SEEN
Casts: NONE SEEN
Glucose, UA: NEGATIVE mg/dL
Hgb urine dipstick: NEGATIVE
Ketones, ur: NEGATIVE mg/dL
Leukocytes, UA: NEGATIVE
NITRITE: NEGATIVE
PH: 5.5 (ref 5.0–8.0)
Protein, ur: NEGATIVE mg/dL
SPECIFIC GRAVITY, URINE: 1.025 (ref 1.005–1.030)
Squamous Epithelial / LPF: NONE SEEN
Urobilinogen, UA: 0.2 mg/dL (ref 0.0–1.0)

## 2013-12-03 LAB — COMPREHENSIVE METABOLIC PANEL
ALBUMIN: 4.2 g/dL (ref 3.5–5.2)
ALT: 11 U/L (ref 0–35)
AST: 16 U/L (ref 0–37)
Alkaline Phosphatase: 47 U/L (ref 39–117)
BUN: 16 mg/dL (ref 6–23)
CALCIUM: 9.1 mg/dL (ref 8.4–10.5)
CHLORIDE: 105 meq/L (ref 96–112)
CO2: 25 meq/L (ref 19–32)
Creat: 0.89 mg/dL (ref 0.50–1.10)
GLUCOSE: 76 mg/dL (ref 70–99)
POTASSIUM: 4.3 meq/L (ref 3.5–5.3)
SODIUM: 138 meq/L (ref 135–145)
TOTAL PROTEIN: 6.9 g/dL (ref 6.0–8.3)
Total Bilirubin: 0.9 mg/dL (ref 0.2–1.2)

## 2013-12-03 LAB — LIPID PANEL
Cholesterol: 233 mg/dL — ABNORMAL HIGH (ref 0–200)
HDL: 44 mg/dL (ref >39)
LDL CALC: 171 mg/dL — AB (ref 0–99)
TRIGLYCERIDES: 89 mg/dL (ref <150)
Total CHOL/HDL Ratio: 5.3 Ratio
VLDL: 18 mg/dL (ref 0–40)

## 2013-12-09 ENCOUNTER — Other Ambulatory Visit: Payer: Self-pay

## 2013-12-09 DIAGNOSIS — Z1231 Encounter for screening mammogram for malignant neoplasm of breast: Secondary | ICD-10-CM

## 2013-12-09 DIAGNOSIS — Z9189 Other specified personal risk factors, not elsewhere classified: Secondary | ICD-10-CM

## 2013-12-11 ENCOUNTER — Other Ambulatory Visit: Payer: Self-pay

## 2013-12-24 ENCOUNTER — Ambulatory Visit
Admission: RE | Admit: 2013-12-24 | Discharge: 2013-12-24 | Disposition: A | Discharge status: Home / Self Care | Payer: PRIVATE HEALTH INSURANCE | Source: Ambulatory Visit

## 2013-12-24 DIAGNOSIS — Z9189 Other specified personal risk factors, not elsewhere classified: Secondary | ICD-10-CM

## 2013-12-24 DIAGNOSIS — Z1231 Encounter for screening mammogram for malignant neoplasm of breast: Secondary | ICD-10-CM

## 2013-12-28 ENCOUNTER — Encounter: Payer: Self-pay | Admitting: Gynecology

## 2014-02-26 HISTORY — PX: BREAST BIOPSY: SHX20

## 2014-03-16 ENCOUNTER — Other Ambulatory Visit: Payer: Self-pay | Admitting: Gynecology

## 2014-03-16 DIAGNOSIS — Z803 Family history of malignant neoplasm of breast: Secondary | ICD-10-CM

## 2014-03-24 ENCOUNTER — Encounter: Payer: Self-pay | Admitting: Gynecology

## 2014-03-24 ENCOUNTER — Ambulatory Visit (INDEPENDENT_AMBULATORY_CARE_PROVIDER_SITE_OTHER): Payer: PRIVATE HEALTH INSURANCE | Admitting: Gynecology

## 2014-03-24 VITALS — BP 140/80

## 2014-03-24 DIAGNOSIS — F42 Obsessive-compulsive disorder: Secondary | ICD-10-CM

## 2014-03-24 DIAGNOSIS — F4321 Adjustment disorder with depressed mood: Secondary | ICD-10-CM

## 2014-03-24 DIAGNOSIS — F429 Obsessive-compulsive disorder, unspecified: Secondary | ICD-10-CM

## 2014-03-24 MED ORDER — PAROXETINE HCL ER 12.5 MG PO TB24: 12.5000 mg | ORAL_TABLET | ORAL | Status: DC

## 2014-03-24 NOTE — Patient Instructions (Signed)
Obsessive-Compulsive Disorder Obsessive-compulsive disorder (OCD) is a mental illness. People with OCD have obsessions, compulsions, or both. Obsessions are unwanted and distressing thoughts, ideas, or urges that keep entering your mind. You may find yourself trying to ignore them. You may try to stop or undo them with a compulsion. Compulsions are repetitive physical or mental acts that you feel driven to perform. The actsmay seem senseless or excessive. They usually reduce or prevent any emotional distress. Obsessions and compulsions can be time consuming (take more than 1 hour a day). They can interfere with personal relationships and normal activities at home, school, or work.  OCD usually starts in young adulthood and continues throughout life. People who have family members with OCD are at higher risk for developing OCD. Women are at higher risk for OCD during and after pregnancy. Many people with OCD also have depression or another mental health disorder. SYMPTOMS  People with obsessions usually have a fear that something terrible will happen or that they will do something terrible. Examples of common obsessions include:   Fear of contamination with germs, bodily waste, or poisonous substances.  Fear of making the wrong decision (self-doubt).  Violent or sexual thoughts or urges towards others.  Need for symmetry or exactness. Examples of typical compulsive acts include:   Excessive hand washing or bathing due to fear of contamination.  Checking things over and over to make sure a task has been completed. For example, making sure a door is locked or a toaster is unplugged.  Repeating an act or phrase over and over, sometimes a specific number of times, until it feels right.  Arranging and rearranging objects to keep them in a certain order. DIAGNOSIS  OCD is diagnosed through an assessment by your health care provider. Your health care provider will ask questions about any obsessions  or compulsions you have and how they affect your life. Your health care provider may also ask about your medical history, prescription medicine, and drug use. Certain medical conditions and substances can cause symptoms similar to OCD. TREATMENT  The following forms of treatment are available for OCD:  Cognitive therapy. This is a form of talk therapy. The goal is to identify and change the irrational thoughts associated with obsessions.  Behavioral therapy. This is also a form of talk therapy. The goal is to target and reduce compulsive acts (behaviors). A specific type of behavioral therapy called exposure and response prevention is considered the most effective treatment for OCD. You will be exposed to the distressing situation that triggers your compulsion and prevented from responding to it. With repetition of this process over time, you will no longer feel the distress or need to perform the compulsion.   Medicine. Certain types of antidepressant medicine may help reduce or control OCD symptoms. Medication is most effective when it is used with cognitive or behavioral therapy. For severe OCD that does not respond to talk therapy and medication, brain surgery or electrical stimulation of specific areas of the brain (deep brain stimulation) may be helpful. HOME CARE INSTRUCTIONS   Take all medicines as prescribed.  Check with your health care provider before starting new prescription or over-the-counter medicines. SEEK MEDICAL CARE IF:  If you are not able to take your medicines as prescribed.  If your symptoms get worse. SEEK IMMEDIATE MEDICAL CARE IF:  You feel that any of your ideas or actions are slipping out of your control. Document Released: 02/06/2001 Document Revised: 02/17/2013 Document Reviewed: 09/26/2012 [employer] Patient Information 2015   [employer]. This information is not intended to replace advice given to you by your health care provider. Make sure you discuss any  questions you have with your health care provider. Generalized Anxiety Disorder Generalized anxiety disorder (GAD) is a mental disorder. It interferes with life functions, including relationships, work, and school. GAD is different from normal anxiety, which everyone experiences at some point in their lives in response to specific life events and activities. Normal anxiety actually helps [place] prepare for and get through these life events and activities. Normal anxiety goes away after the event or activity is over.  GAD causes anxiety that is not necessarily related to specific events or activities. It also causes excess anxiety in proportion to specific events or activities. The anxiety associated with GAD is also difficult to control. GAD can vary from mild to severe. People with severe GAD can have intense waves of anxiety with physical symptoms (panic attacks).  SYMPTOMS The anxiety and worry associated with GAD are difficult to control. This anxiety and worry are related to many life events and activities and also occur more days than not for 6 months or longer. People with GAD also have three or more of the following symptoms (one or more in children):  Restlessness.   Fatigue.  Difficulty concentrating.   Irritability.  Muscle tension.  Difficulty sleeping or unsatisfying sleep. DIAGNOSIS GAD is diagnosed through an assessment by your health care provider. Your health care provider will ask you questions aboutyour mood,physical symptoms, and events in your life. Your health care provider may ask you about your medical history and use of alcohol or drugs, including prescription medicines. Your health care provider may also do a physical exam and blood tests. Certain medical conditions and the use of certain substances can cause symptoms similar to those associated with GAD. Your health care provider may refer you to a mental health specialist for further evaluation. TREATMENT The  following therapies are usually used to treat GAD:   Medication. Antidepressant medication usually is prescribed for long-term daily control. Antianxiety medicines may be added in severe cases, especially when panic attacks occur.   Talk therapy (psychotherapy). Certain types of talk therapy can be helpful in treating GAD by providing support, education, and guidance. A form of talk therapy called cognitive behavioral therapy can teach you healthy ways to think about and react to daily life events and activities.  Stress managementtechniques. These include yoga, meditation, and exercise and can be very helpful when they are practiced regularly. A mental health specialist can help determine which treatment is best for you. Some people see improvement with one therapy. However, other people require a combination of therapies. Document Released: 06/09/2012 Document Revised: 06/29/2013 Document Reviewed: 06/09/2012 [facility] Patient Information 2015 [place], [place]. This information is not intended to replace advice given to you by your health care provider. Make sure you discuss any questions you have with your health care provider. Depression Depression refers to feeling sad, low, down in the dumps, blue, gloomy, or empty. In general, there are two kinds of depression:  Normal sadness or normal grief. This kind of depression is one that we all feel from time to time after upsetting life experiences, such as the loss of a job or the ending of a relationship. This kind of depression is considered normal, is short lived, and resolves within a few days to 2 weeks. Depression experienced after the loss of a loved one (bereavement) often lasts longer than 2 weeks but normally gets  better with time.  Clinical depression. This kind of depression lasts longer than normal sadness or normal grief or interferes with your ability to function at home, at work, and in school. It also interferes with your personal  relationships. It affects almost every aspect of your life. Clinical depression is an illness. Symptoms of depression can also be caused by conditions other than those mentioned above, such as:  Physical illness. Some physical illnesses, including underactive thyroid gland (hypothyroidism), severe anemia, specific types of cancer, diabetes, uncontrolled seizures, heart and lung problems, strokes, and chronic pain are commonly associated with symptoms of depression.  Side effects of some prescription medicine. In some people, certain types of medicine can cause symptoms of depression.  Substance abuse. Abuse of alcohol and illicit drugs can cause symptoms of depression. SYMPTOMS Symptoms of normal sadness and normal grief include the following:  Feeling sad or crying for short periods of time.  Not caring about anything (apathy).  Difficulty sleeping or sleeping too much.  No longer able to enjoy the things you used to enjoy.  Desire to be by oneself all the time (social isolation).  Lack of energy or motivation.  Difficulty concentrating or remembering.  Change in appetite or weight.  Restlessness or agitation. Symptoms of clinical depression include the same symptoms of normal sadness or normal grief and also the following symptoms:  Feeling sad or crying all the time.  Feelings of guilt or worthlessness.  Feelings of hopelessness or helplessness.  Thoughts of suicide or the desire to harm yourself (suicidal ideation).  Loss of touch with reality (psychotic symptoms). Seeing or hearing things that are not real (hallucinations) or having false beliefs about your life or the people around you (delusions and paranoia). DIAGNOSIS  The diagnosis of clinical depression is usually based on how bad the symptoms are and how long they have lasted. Your health care provider will also ask you questions about your medical history and substance use to find out if physical illness, use of  prescription medicine, or substance abuse is causing your depression. Your health care provider may also order blood tests. TREATMENT  Often, normal sadness and normal grief do not require treatment. However, sometimes antidepressant medicine is given for bereavement to ease the depressive symptoms until they resolve. The treatment for clinical depression depends on how bad the symptoms are but often includes antidepressant medicine, counseling with a mental health professional, or both. Your health care provider will help to determine what treatment is best for you. Depression caused by physical illness usually goes away with appropriate medical treatment of the illness. If prescription medicine is causing depression, talk with your health care provider about stopping the medicine, decreasing the dose, or changing to another medicine. Depression caused by the abuse of alcohol or illicit drugs goes away when you stop using these substances. Some adults need professional help in order to stop drinking or using drugs. SEEK IMMEDIATE MEDICAL CARE IF:  You have thoughts about hurting yourself or others.  You lose touch with reality (have psychotic symptoms).  You are taking medicine for depression and have a serious side effect. FOR MORE INFORMATION  National Alliance on Mental Illness: www.nami.[employer]: https://carter.com/ Document Released: 02/10/2000 Document Revised: 06/29/2013 Document Reviewed: 05/14/2011 [facility] Patient Information 2015 [place], [place]. This information is not intended to replace advice given to you by your health care provider. Make sure you discuss any questions you have with your health care provider. Paroxetine Controlled-Release Tablets  What is this medicine? PAROXETINE (pa ROX e teen) is used to treat depression. It may also be used to treat anxiety disorders, obsessive compulsive disorder, panic attacks, post traumatic stress, and  premenstrual dysphoric disorder (PMDD). This medicine may be used for other purposes; ask your health care provider or pharmacist if you have questions. COMMON BRAND NAME(S): Paxil CR What should I tell my health care provider before I take this medicine? They need to know if you have any of these conditions: -bleeding disorders -glaucoma -heart disease -kidney disease -liver disease -low levels of sodium in the blood -mania or bipolar disorder -seizures -suicidal thoughts, plans, or attempt; a previous suicide attempt by you or a family member -take MAOIs like Carbex, Eldepryl, Marplan, Nardil, and Parnate -take medicines that treat or prevent blood clots -an unusual or allergic reaction to paroxetine, other medicines, foods, dyes, or preservatives -pregnant or trying to get pregnant -breast-feeding How should I use this medicine? Take this medicine by mouth with a glass of water. Follow the directions on the prescription label. You can take it with or without food. Do not crush or chew this medicine. Take your medicine at regular intervals. Do not take your medicine more often than directed. Do not stop taking this medicine suddenly except upon the advice of your doctor. Stopping this medicine too quickly may cause serious side effects or your condition may worsen. A special MedGuide will be given to you by the pharmacist with each prescription and refill. Be sure to read this information carefully each time. Talk to your pediatrician regarding the use of this medicine in children. Special care may be needed. Overdosage: If you think you have taken too much of this medicine contact a poison control center or emergency room at once. NOTE: This medicine is only for you. Do not share this medicine with others. What if I miss a dose? If you miss a dose, take it as soon as you can. If it is almost time for your next dose, take only that dose. Do not take double or extra doses. What may  interact with this medicine? Do not take this medicine with any of the following medications: -linezolid -MAOIs like Carbex, Eldepryl, Marplan, Nardil, and Parnate -methylene blue (injected into a vein) -pimozide -thioridazine This medicine may also interact with the following medications: -alcohol -antacids -aspirin and aspirin-like medicines -atomoxetine -certain medicines for depression, anxiety, or psychotic disturbances -certain medicines for irregular heart beat like propafenone, flecainide, encainide, and quinidine -certain medicines for migraine headache like almotriptan, eletriptan, frovatriptan, naratriptan, rizatriptan, sumatriptan, zolmitriptan -cimetidine -digoxin -diuretics -fentanyl -fosamprenavir/ritonavir -furazolidone -isoniazid -lithium -medicines that treat or prevent blood clots like warfarin, enoxaparin, and dalteparin -medicines for sleep -metoprolol -NSAIDs, medicines for pain and inflammation, like ibuprofen or naproxen -phenobarbital -phenytoin -procarbazine -procyclidine -rasagiline -supplements like St. John's wort, kava kava, valerian -tamoxifen -theophylline -tramadol -tryptophan This list may not describe all possible interactions. Give your health care provider a list of all the medicines, herbs, non-prescription drugs, or dietary supplements you use. Also tell them if you smoke, drink alcohol, or use illegal drugs. Some items may interact with your medicine. What should I watch for while using this medicine? Tell your doctor if your symptoms do not get better or if they get worse. Visit your doctor or health care professional for regular checks on your progress. Because it may take several weeks to see the full effects of this medicine, it is important to continue your treatment as prescribed by your doctor.  Patients and their families should watch out for new or worsening thoughts of suicide or depression. Also watch out for sudden changes in  feelings such as feeling anxious, agitated, panicky, irritable, hostile, aggressive, impulsive, severely restless, overly excited and hyperactive, or not being able to sleep. If this happens, especially at the beginning of treatment or after a change in dose, call your health care professional. Dennis Bast may get drowsy or dizzy. Do not drive, use machinery, or do anything that needs mental alertness until you know how this medicine affects you. Do not stand or sit up quickly, especially if you are an older patient. This reduces the risk of dizzy or fainting spells. Alcohol may interfere with the effect of this medicine. Avoid alcoholic drinks. Your mouth may get dry. Chewing sugarless gum or sucking hard candy, and drinking plenty of water will help. Contact your doctor if the problem does not go away or is severe. What side effects may I notice from receiving this medicine? Side effects that you should report to your doctor or health care professional as soon as possible: -allergic reactions like skin rash, itching or hives, swelling of the face, lips, or tongue -black or bloody stools, blood in the urine or vomit -fast, irregular heartbeat -hallucination, loss of contact with reality -painful or prolonged erection (men) -seizures -suicidal thoughts or other mood changes -trouble passing urine or change in the amount of urine -unusual bleeding or bruising -unusually weak or tired -vomiting Side effects that usually do not require medical attention (report to your doctor or health care professional if they continue or are bothersome): -change in appetite, weight -change in sex drive or performance -constipation or diarrhea -difficulty sleeping -drowsy -headache -increased sweating -muscle pain or weakness -tremors This list may not describe all possible side effects. Call your doctor for medical advice about side effects. You may report side effects to FDA at 1-800-FDA-1088. Where should I keep  my medicine? Keep out of the reach of children. Store at or below 25 degrees C (77 degrees F). Throw away any unused medicine after the expiration date. NOTE: This sheet is a summary. It may not cover all possible information. If you have questions about this medicine, talk to your doctor, pharmacist, or health care provider.  2015, [employer]. (2011-09-27 17:51:56)

## 2014-03-24 NOTE — Progress Notes (Signed)
   Patient is a 39 year old who presented to the office today to discuss several personal issues. She was seen the office in October 2015 for her annual gynecological examination patient is complaining of feeling anxious and inability to complete all her daily tasks. She does not have any suicidal ideation but feels somewhat depressed at times. She has good family support. She was being followed by the endocrinologist for hypothyroidism and then discontinued seen the endocrinologist and went to an integrated health/holistic physician which she was taking a multitude of supplements and did not feel as well and returned back to her endocrinologist and found that her thyroid function tests were weight out of range. Her thyroid hormone was adjusted from 50 g to 75 g of levothyroxine. She also appears to have some signs of obsessive-compulsive disorder whereby she is fixated on her running and constantly watching her weight. She states that she sleeps at least 6-8 hours a day but nevertheless sometimes feels exhausted.  It appears that patient will need to be started on an SSRI such as Paxil 12.5 mg daily to help with her anxiety, slight depression, and OCD symptoms. She is currently on a continuous oral contraceptive pill which has helped for her dysmenorrhea, menorrhagia and endometriosis history besides contraception. The risks benefits and pros and cons of the medication were discussed with Aramis and she would like to start. If she has no improvement in the next 6-8 weeks' she will contact the office and I would like to refer her to a therapist. Literature information on the above disorders as well as on the medication was provided. The risks benefits and pros and cons were discussed and outlined as well. Time of consultation 25 minutes.

## 2014-03-28 ENCOUNTER — Ambulatory Visit
Admission: RE | Admit: 2014-03-28 | Discharge: 2014-03-28 | Disposition: A | Discharge status: Home / Self Care | Payer: PRIVATE HEALTH INSURANCE | Source: Ambulatory Visit | Attending: Gynecology | Admitting: Gynecology

## 2014-03-28 DIAGNOSIS — Z803 Family history of malignant neoplasm of breast: Secondary | ICD-10-CM

## 2014-03-28 MED ORDER — GADOBENATE DIMEGLUMINE 529 MG/ML IV SOLN
12.0000 mL | Freq: Once | INTRAVENOUS | Status: AC | PRN
  Administered 2014-03-28: 12 mL via INTRAVENOUS

## 2014-03-29 ENCOUNTER — Other Ambulatory Visit: Payer: Self-pay | Admitting: Gynecology

## 2014-03-29 DIAGNOSIS — R928 Other abnormal and inconclusive findings on diagnostic imaging of breast: Secondary | ICD-10-CM

## 2014-03-30 ENCOUNTER — Ambulatory Visit
Admission: RE | Admit: 2014-03-30 | Discharge: 2014-03-30 | Disposition: A | Discharge status: Home / Self Care | Payer: PRIVATE HEALTH INSURANCE | Source: Ambulatory Visit | Attending: Gynecology | Admitting: Gynecology

## 2014-03-30 ENCOUNTER — Other Ambulatory Visit: Payer: Self-pay | Admitting: Gynecology

## 2014-03-30 DIAGNOSIS — R928 Other abnormal and inconclusive findings on diagnostic imaging of breast: Secondary | ICD-10-CM

## 2014-04-02 ENCOUNTER — Ambulatory Visit
Admission: RE | Admit: 2014-04-02 | Discharge: 2014-04-02 | Disposition: A | Discharge status: Home / Self Care | Payer: PRIVATE HEALTH INSURANCE | Source: Ambulatory Visit | Attending: Gynecology | Admitting: Gynecology

## 2014-04-02 DIAGNOSIS — R928 Other abnormal and inconclusive findings on diagnostic imaging of breast: Secondary | ICD-10-CM

## 2014-04-02 MED ORDER — GADOBENATE DIMEGLUMINE 529 MG/ML IV SOLN
12.0000 mL | Freq: Once | INTRAVENOUS | Status: AC | PRN
  Administered 2014-04-02: 12 mL via INTRAVENOUS

## 2014-04-05 ENCOUNTER — Other Ambulatory Visit: Payer: Self-pay | Admitting: Gynecology

## 2014-04-05 DIAGNOSIS — R928 Other abnormal and inconclusive findings on diagnostic imaging of breast: Secondary | ICD-10-CM

## 2014-04-13 ENCOUNTER — Ambulatory Visit (INDEPENDENT_AMBULATORY_CARE_PROVIDER_SITE_OTHER): Payer: PRIVATE HEALTH INSURANCE | Admitting: Family

## 2014-04-13 ENCOUNTER — Encounter: Payer: Self-pay | Admitting: Family

## 2014-04-13 VITALS — BP 134/81 | HR 80 | Temp 97.1°F | Ht 65.0 in | Wt 135.4 lb

## 2014-04-13 DIAGNOSIS — R05 Cough: Secondary | ICD-10-CM

## 2014-04-13 DIAGNOSIS — J069 Acute upper respiratory infection, unspecified: Secondary | ICD-10-CM

## 2014-04-13 DIAGNOSIS — R059 Cough, unspecified: Secondary | ICD-10-CM

## 2014-04-13 MED ORDER — BENZONATATE 200 MG PO CAPS: 200.0000 mg | ORAL_CAPSULE | Freq: Three times a day (TID) | ORAL | Status: DC | PRN

## 2014-04-13 MED ORDER — AZITHROMYCIN 250 MG PO TABS: ORAL_TABLET | ORAL | Status: DC

## 2014-04-13 NOTE — Progress Notes (Signed)
Subjective:    Patient ID: Terri Hoover, female    DOB: 1975-07-19, 39 y.o.   MRN: [id number]  Cough This is a new problem. The current episode started 1 to 4 weeks ago. The problem has been waxing and waning. The problem occurs every few minutes. The cough is productive of purulent sputum. Associated symptoms include ear pain, headaches, myalgias, nasal congestion, postnasal drip, rhinorrhea and a sore throat. Pertinent negatives include no chills, ear congestion, fever, shortness of breath or wheezing. The symptoms are aggravated by lying down. She has tried rest (Mucinex) for the symptoms. The treatment provided mild relief. There is no history of asthma or COPD.      Review of Systems  Constitutional: Negative.  Negative for fever and chills.  HENT: Positive for ear pain, postnasal drip, rhinorrhea and sore throat.   Eyes: Negative.   Respiratory: Positive for cough. Negative for shortness of breath and wheezing.   Cardiovascular: Negative.  Negative for palpitations.  Gastrointestinal: Negative.   Endocrine: Negative.   Genitourinary: Negative.   Musculoskeletal: Positive for myalgias.  Neurological: Positive for headaches.  Hematological: Negative.   Psychiatric/Behavioral: Negative.   All other systems reviewed and are negative.      Objective:   Physical Exam  Constitutional: She is oriented to person, place, and time. She appears well-developed and well-nourished. No distress.  HENT:  Head: Normocephalic and atraumatic.  Right Ear: External ear normal.  Left Ear: External ear normal.  Nasal passage erythemas with mild swelling  Oropharynx erythemas    Eyes: Pupils are equal, round, and reactive to light.  Neck: Normal range of motion. Neck supple. No thyromegaly present.  Cardiovascular: Normal rate, regular rhythm, normal heart sounds and intact distal pulses.   No murmur heard. Pulmonary/Chest: Effort normal and breath sounds normal. No respiratory distress.  She has no wheezes.  Abdominal: Soft. Bowel sounds are normal. She exhibits no distension. There is no tenderness.  Musculoskeletal: Normal range of motion. She exhibits no edema or tenderness.  Neurological: She is alert and oriented to person, place, and time.  Skin: Skin is warm and dry.  Psychiatric: She has a normal mood and affect. Her behavior is normal. Judgment and thought content normal.  Vitals reviewed.     BP 134/81 mmHg  Pulse 80  Temp(Src) 97.1 F (36.2 C) (Oral)  Ht 5\' 5"  (1.651 m)  Wt 135 lb 6.4 oz (61.417 kg)  BMI 22.53 kg/m2     Assessment & Plan:  1. URI, acute - Take meds as prescribed - Use a cool mist humidifier  -Use saline nose sprays frequently -Saline irrigations of the nose can be very helpful if done frequently.  * 4X daily for 1 week*  * Use of a nettie pot can be helpful with this. Follow directions with this* -Force fluids -For any cough or congestion  Use plain Mucinex- regular strength or max strength is fine   * Children- consult with Pharmacist for dosing -For fever or aces or pains- take tylenol or ibuprofen appropriate for age and weight.  * for fevers greater than 101 orally you may alternate ibuprofen and tylenol every  3 hours. -Throat lozenges if help - azithromycin (ZITHROMAX) 250 MG tablet; Take 500 mg once, then 250 mg for four days  Dispense: 6 tablet; Refill: 0  2. Cough - benzonatate (TESSALON) 200 MG capsule; Take 1 capsule (200 mg total) by mouth 3 (three) times daily as needed.  Dispense: 30 capsule; Refill: 1 -  azithromycin (ZITHROMAX) 250 MG tablet; Take 500 mg once, then 250 mg for four days  Dispense: 6 tablet; Refill: 0  Evelina Dun, FNP

## 2014-04-13 NOTE — Patient Instructions (Addendum)
Upper Respiratory Infection, Adult An upper respiratory infection (URI) is also sometimes known as the common cold. The upper respiratory tract includes the nose, sinuses, throat, trachea, and bronchi. Bronchi are the airways leading to the lungs. Most people improve within 1 week, but symptoms can last up to 2 weeks. A residual cough may last even longer.  CAUSES Many different viruses can infect the tissues lining the upper respiratory tract. The tissues become irritated and inflamed and often become very moist. Mucus production is also common. A cold is contagious. You can easily spread the virus to others by oral contact. This includes kissing, sharing a glass, coughing, or sneezing. Touching your mouth or nose and then touching a surface, which is then touched by another person, can also spread the virus. SYMPTOMS  Symptoms typically develop 1 to 3 days after you come in contact with a cold virus. Symptoms vary from person to person. They may include:  Runny nose.  Sneezing.  Nasal congestion.  Sinus irritation.  Sore throat.  Loss of voice (laryngitis).  Cough.  Fatigue.  Muscle aches.  Loss of appetite.  Headache.  Low-grade fever. DIAGNOSIS  You might diagnose your own cold based on familiar symptoms, since most people get a cold 2 to 3 times a year. Your caregiver can confirm this based on your exam. Most importantly, your caregiver can check that your symptoms are not due to another disease such as strep throat, sinusitis, pneumonia, asthma, or epiglottitis. Blood tests, throat tests, and X-rays are not necessary to diagnose a common cold, but they may sometimes be helpful in excluding other more serious diseases. Your caregiver will decide if any further tests are required. RISKS AND COMPLICATIONS  You may be at risk for a more severe case of the common cold if you smoke cigarettes, have chronic heart disease (such as heart failure) or lung disease (such as asthma), or if  you have a weakened immune system. The very young and very old are also at risk for more serious infections. Bacterial sinusitis, middle ear infections, and bacterial pneumonia can complicate the common cold. The common cold can worsen asthma and chronic obstructive pulmonary disease (COPD). Sometimes, these complications can require emergency medical care and may be life-threatening. PREVENTION  The best way to protect against getting a cold is to practice good hygiene. Avoid oral or hand contact with people with cold symptoms. Wash your hands often if contact occurs. There is no clear evidence that vitamin C, vitamin E, echinacea, or exercise reduces the chance of developing a cold. However, it is always recommended to get plenty of rest and practice good nutrition. TREATMENT  Treatment is directed at relieving symptoms. There is no cure. Antibiotics are not effective, because the infection is caused by a virus, not by bacteria. Treatment may include:  Increased fluid intake. Sports drinks offer valuable electrolytes, sugars, and fluids.  Breathing heated mist or steam (vaporizer or shower).  Eating chicken soup or other clear broths, and maintaining good nutrition.  Getting plenty of rest.  Using gargles or lozenges for comfort.  Controlling fevers with ibuprofen or acetaminophen as directed by your caregiver.  Increasing usage of your inhaler if you have asthma. Zinc gel and zinc lozenges, taken in the first 24 hours of the common cold, can shorten the duration and lessen the severity of symptoms. Pain medicines may help with fever, muscle aches, and throat pain. A variety of non-prescription medicines are available to treat congestion and runny nose. Your caregiver   can make recommendations and may suggest nasal or lung inhalers for other symptoms.  HOME CARE INSTRUCTIONS   Only take over-the-counter or prescription medicines for pain, discomfort, or fever as directed by your  caregiver.  Use a warm mist humidifier or inhale steam from a shower to increase air moisture. This may keep secretions moist and make it easier to breathe.  Drink enough water and fluids to keep your urine clear or pale yellow.  Rest as needed.  Return to work when your temperature has returned to normal or as your caregiver advises. You may need to stay home longer to avoid infecting others. You can also use a face mask and careful hand washing to prevent spread of the virus. SEEK MEDICAL CARE IF:   After the first few days, you feel you are getting worse rather than better.  You need your caregiver's advice about medicines to control symptoms.  You develop chills, worsening shortness of breath, or brown or red sputum. These may be signs of pneumonia.  You develop yellow or brown nasal discharge or pain in the face, especially when you bend forward. These may be signs of sinusitis.  You develop a fever, swollen neck glands, pain with swallowing, or white areas in the back of your throat. These may be signs of strep throat. SEEK IMMEDIATE MEDICAL CARE IF:   You have a fever.  You develop severe or persistent headache, ear pain, sinus pain, or chest pain.  You develop wheezing, a prolonged cough, cough up blood, or have a change in your usual mucus (if you have chronic lung disease).  You develop sore muscles or a stiff neck. Document Released: 08/08/2000 Document Revised: 05/07/2011 Document Reviewed: 05/20/2013 [employer] Patient Information 2015 [employer]. This information is not intended to replace advice given to you by your health care provider. Make sure you discuss any questions you have with your health care provider.  - Take meds as prescribed - Use a cool mist humidifier  -Use saline nose sprays frequently -Saline irrigations of the nose can be very helpful if done frequently.  * 4X daily for 1 week*  * Use of a nettie pot can be helpful with this. Follow  directions with this* -Force fluids -For any cough or congestion  Use plain Mucinex- regular strength or max strength is fine   * Children- consult with Pharmacist for dosing -For fever or aces or pains- take tylenol or ibuprofen appropriate for age and weight.  * for fevers greater than 101 orally you may alternate ibuprofen and tylenol every  3 hours. -Throat lozenges if help   Leeanna Slaby, FNP   

## 2014-04-20 ENCOUNTER — Ambulatory Visit
Admission: RE | Admit: 2014-04-20 | Discharge: 2014-04-20 | Disposition: A | Discharge status: Home / Self Care | Payer: PRIVATE HEALTH INSURANCE | Source: Ambulatory Visit | Attending: Gynecology | Admitting: Gynecology

## 2014-04-20 DIAGNOSIS — R928 Other abnormal and inconclusive findings on diagnostic imaging of breast: Secondary | ICD-10-CM

## 2014-04-20 MED ORDER — GADOBENATE DIMEGLUMINE 529 MG/ML IV SOLN
12.0000 mL | Freq: Once | INTRAVENOUS | Status: AC | PRN
  Administered 2014-04-20: 12 mL via INTRAVENOUS

## 2014-06-18 ENCOUNTER — Telehealth: Payer: Self-pay | Admitting: *Deleted

## 2014-06-18 NOTE — Telephone Encounter (Signed)
Left message for pt to call.

## 2014-06-18 NOTE — Telephone Encounter (Signed)
If its for anxiety we could prescribe low dose Xanax 0.25 mg one PO QD PRN. #30 refill x 5. She may need to come by office before 5 to pick up prescription and Izora Gala sign it.

## 2014-06-18 NOTE — Telephone Encounter (Signed)
Pt was prescribed Paxil 12.5 mg on OV 03/24/14 took for a couple of months and stopped because it made her very tired. Pt would like to switch to a medication that she could just take PRN, not daily if possible. Please advise

## 2014-06-21 MED ORDER — ALPRAZOLAM 0.25 MG PO TABS: 0.2500 mg | ORAL_TABLET | Freq: Every evening | ORAL | Status: DC | PRN

## 2014-06-21 NOTE — Telephone Encounter (Signed)
Rx called in 

## 2014-07-06 ENCOUNTER — Other Ambulatory Visit (HOSPITAL_COMMUNITY)
Admission: RE | Admit: 2014-07-06 | Discharge: 2014-07-06 | Disposition: A | Discharge status: Home / Self Care | Payer: PRIVATE HEALTH INSURANCE | Source: Ambulatory Visit | Attending: Gynecology | Admitting: Gynecology

## 2014-07-06 ENCOUNTER — Ambulatory Visit (INDEPENDENT_AMBULATORY_CARE_PROVIDER_SITE_OTHER): Payer: PRIVATE HEALTH INSURANCE | Admitting: Gynecology

## 2014-07-06 ENCOUNTER — Encounter: Payer: Self-pay | Admitting: Gynecology

## 2014-07-06 VITALS — BP 136/82

## 2014-07-06 DIAGNOSIS — Z1151 Encounter for screening for human papillomavirus (HPV): Secondary | ICD-10-CM | POA: Insufficient documentation

## 2014-07-06 DIAGNOSIS — N939 Abnormal uterine and vaginal bleeding, unspecified: Secondary | ICD-10-CM

## 2014-07-06 DIAGNOSIS — Z01419 Encounter for gynecological examination (general) (routine) without abnormal findings: Secondary | ICD-10-CM | POA: Diagnosis not present

## 2014-07-06 DIAGNOSIS — Z87410 Personal history of cervical dysplasia: Secondary | ICD-10-CM

## 2014-07-06 DIAGNOSIS — E063 Autoimmune thyroiditis: Secondary | ICD-10-CM

## 2014-07-06 DIAGNOSIS — N923 Ovulation bleeding: Secondary | ICD-10-CM

## 2014-07-06 DIAGNOSIS — Z124 Encounter for screening for malignant neoplasm of cervix: Secondary | ICD-10-CM | POA: Diagnosis not present

## 2014-07-06 DIAGNOSIS — N930 Postcoital and contact bleeding: Secondary | ICD-10-CM | POA: Diagnosis not present

## 2014-07-06 LAB — CBC WITH DIFFERENTIAL/PLATELET
Basophils Absolute: 0 10*3/uL (ref 0.0–0.1)
Basophils Relative: 0 % (ref 0–1)
Eosinophils Absolute: 0.1 10*3/uL (ref 0.0–0.7)
Eosinophils Relative: 2 % (ref 0–5)
HEMATOCRIT: 42.4 % (ref 36.0–46.0)
Hemoglobin: 13.9 g/dL (ref 12.0–15.0)
Lymphocytes Relative: 24 % (ref 12–46)
Lymphs Abs: 1.7 10*3/uL (ref 0.7–4.0)
MCH: 29.1 pg (ref 26.0–34.0)
MCHC: 32.8 g/dL (ref 30.0–36.0)
MCV: 88.7 fL (ref 78.0–100.0)
MPV: 11.7 fL (ref 8.6–12.4)
Monocytes Absolute: 0.4 10*3/uL (ref 0.1–1.0)
Monocytes Relative: 6 % (ref 3–12)
NEUTROS ABS: 4.7 10*3/uL (ref 1.7–7.7)
NEUTROS PCT: 68 % (ref 43–77)
Platelets: 209 10*3/uL (ref 150–400)
RBC: 4.78 MIL/uL (ref 3.87–5.11)
RDW: 12.9 % (ref 11.5–15.5)
WBC: 6.9 10*3/uL (ref 4.0–10.5)

## 2014-07-06 LAB — PREGNANCY, URINE: PREG TEST UR: NEGATIVE

## 2014-07-06 NOTE — Patient Instructions (Signed)
Endometrial Biopsy, Care After Refer to this sheet in the next few weeks. These instructions provide you with information on caring for yourself after your procedure. Your health care provider may also give you more specific instructions. Your treatment has been planned according to current medical practices, but problems sometimes occur. Call your health care provider if you have any problems or questions after your procedure. WHAT TO EXPECT AFTER THE PROCEDURE After your procedure, it is typical to have the following:  You may have mild cramping and a small amount of vaginal bleeding for a few days after the procedure. This is normal. HOME CARE INSTRUCTIONS  Only take over-the-counter or prescription medicine as directed by your health care provider.  Do not douche, use tampons, or have sexual intercourse until your health care provider approves.  Follow your health care provider's instructions regarding any activity restrictions, such as strenuous exercise or heavy lifting. SEEK MEDICAL CARE IF:  You have heavy bleeding or bleeding longer than 2 days after the procedure.  You have bad smelling drainage from your vagina.  You have a fever and chills.  Youhave severe lower stomach (abdominal) pain. SEEK IMMEDIATE MEDICAL CARE IF:  You have severe cramps in your stomach or back.  You pass large blood clots.  Your bleeding increases.  You become weak or lightheaded, or you pass out. Document Released: 12/03/2012 Document Reviewed: 12/03/2012 Exit[employer] Patient Information 2015 [employer]. This information is not intended to replace advice given to you by your health care provider. Make sure you discuss any questions you have with your health care provider.  

## 2014-07-06 NOTE — Progress Notes (Signed)
   Patient is a 39 year old gravida 2 para 2 on continuous oral contraceptive pill as a result of her past history of endometriosis presented to the office today stating that for the past 3 months she has had intermenstrual bleeding and most recently over the past 2-3 weeks postcoital bleeding as well. Patient had CIN-1 back in 2001 and subsequent Pap smears have been normal. She states that she has been taking her pill on a regular basis. She's had some cramping. She's had no change in sexual partner. Patient denies any nausea or vomiting or any breast tenderness. She does have history of Hashimoto's thyroiditis whereby she is being followed by the endocrinologist Dr. Bubba Camp and stated that 6 weeks ago she had a normal thyroid function tests and her levothyroxine 75 g dose has not been changed.  Exam: Blood pressure 136/82 Gen. appearance well-developed on nursing no acute distress Pelvic: Bartholin urethra Skene was within normal limits Vagina: No lesions or discharge Cervix: No lesions or discharge Bimanual exam uterus anteverted normal size shape and consistency nontender upper limits of normal Adnexa: No palpable masses or tenderness Rectal exam: Not done  Urine print as a test was negative  Pap smear done today.  Patient was counseled for endometrial biopsy since she has been bleeding intermittently for 3 months. The cervix was cleansed with Betadine solution and a sterile Pipelle was introduced into the uterine cavity and tissue was submitted for histological evaluation.   Assessment/plan: Patient on continuous oral contraceptive pill as a result of history of endometriosis at done well until recently. As a result of intermenstrual spotting and endometrial biopsy was done today after confirming that she was not pregnant. She will return back to the office next week for sonohysterogram to rule out any intracavitary defect such as a submucous myoma or endometrial polyp. Pap smear has also  been done today. A CBC will be drawn today. Recent thyroid function test normal.

## 2014-07-08 LAB — CYTOLOGY - PAP

## 2014-07-09 ENCOUNTER — Telehealth: Payer: Self-pay | Admitting: Gynecology

## 2014-07-09 NOTE — Telephone Encounter (Signed)
07/09/14-I LM VM for pt that I called [employer] and was told [facility] covered under $25.00 copay but they did not take the actual codes but went by my description of the procedure to quote benefits.wl

## 2014-07-12 ENCOUNTER — Other Ambulatory Visit: Payer: Self-pay | Admitting: Gynecology

## 2014-07-12 DIAGNOSIS — N939 Abnormal uterine and vaginal bleeding, unspecified: Secondary | ICD-10-CM

## 2014-07-15 ENCOUNTER — Ambulatory Visit (INDEPENDENT_AMBULATORY_CARE_PROVIDER_SITE_OTHER): Payer: PRIVATE HEALTH INSURANCE | Admitting: Gynecology

## 2014-07-15 ENCOUNTER — Other Ambulatory Visit: Payer: Self-pay | Admitting: Gynecology

## 2014-07-15 ENCOUNTER — Ambulatory Visit (INDEPENDENT_AMBULATORY_CARE_PROVIDER_SITE_OTHER): Payer: PRIVATE HEALTH INSURANCE

## 2014-07-15 DIAGNOSIS — S338XXA Sprain of other parts of lumbar spine and pelvis, initial encounter: Secondary | ICD-10-CM

## 2014-07-15 DIAGNOSIS — N930 Postcoital and contact bleeding: Secondary | ICD-10-CM | POA: Diagnosis not present

## 2014-07-15 DIAGNOSIS — N923 Ovulation bleeding: Secondary | ICD-10-CM

## 2014-07-15 DIAGNOSIS — N938 Other specified abnormal uterine and vaginal bleeding: Secondary | ICD-10-CM

## 2014-07-15 DIAGNOSIS — N939 Abnormal uterine and vaginal bleeding, unspecified: Secondary | ICD-10-CM

## 2014-07-15 DIAGNOSIS — S39012A Strain of muscle, fascia and tendon of lower back, initial encounter: Secondary | ICD-10-CM

## 2014-07-15 DIAGNOSIS — R102 Pelvic and perineal pain: Secondary | ICD-10-CM | POA: Diagnosis not present

## 2014-07-15 DIAGNOSIS — N809 Endometriosis, unspecified: Secondary | ICD-10-CM

## 2014-07-15 MED ORDER — PREDNISONE 10 MG (21) PO TBPK: 10.0000 mg | ORAL_TABLET | Freq: Every day | ORAL | Status: DC

## 2014-07-15 MED ORDER — CYCLOBENZAPRINE HCL 5 MG PO TABS: ORAL_TABLET | ORAL | Status: DC

## 2014-07-15 MED ORDER — IBUPROFEN 800 MG PO TABS: 800.0000 mg | ORAL_TABLET | Freq: Three times a day (TID) | ORAL | Status: DC | PRN

## 2014-07-15 NOTE — Progress Notes (Signed)
   Patient presented to the office today as part of her evaluation for her dysfunctional uterine bleeding. She was seen the office on May 10 of this year with the following notes:  Patient is a 39 year old gravida 2 para 2 on continuous oral contraceptive pill as a result of her past history of endometriosis presented to the office today stating that for the past 3 months she has had intermenstrual bleeding and most recently over the past 2-3 weeks postcoital bleeding as well. Patient had CIN-1 back in 2001 and subsequent Pap smears have been normal. She states that she has been taking her pill on a regular basis. She's had some cramping. She's had no change in sexual partner. Patient denies any nausea or vomiting or any breast tenderness. She does have history of Hashimoto's thyroiditis whereby she is being followed by the endocrinologist Dr. Bubba Camp and stated that 6 weeks ago she had a normal thyroid function tests and her levothyroxine 75 g dose has not been changed.  Patient had a normal CBC and a normal Pap smear and had an endometrial biopsy with the following result:  Diagnosis Endometrium, biopsy - INACTIVE ENDOMETRIUM WITH GLAND ATROPHY, STROMAL EDEMA AND BREAKDOWN. - NO ENDOMETRIAL HYPERPLASIA, ATYPIA OR MALIGNANCY IDENTIFIED  .Patient also menstruating that for the past 3-4 months she's been complaining assessment lumbosacral pains no radiculopathy. She was counseled for sonohysterogram today. Ultrasound: Uterus measured 9.6 x 6.5 x 4.4 cm with endometrial stripe of 3.8 mm. Right ovary was normal. Left ovary was normal. Some fluid was noted the cul-de-sac 27 x 18 mm. No adnexal masses were noted. The cervix was cleansed with Betadine solution a sterile Pipelle was introduced into the uterine cavity and no intracavitary defect was noted  Assessment/plan: Breakthrough bleeding on oral contraceptive pill negative workup. Continuous regimen has help with patient's endometriosis no abdominal  pelvic pain or dyspareunia reported. 3-4 with history of lumbosacral discomfort may be attributed to a strain. She has no radiculopathy. Normal leg raises. She is going to be given a trial of prednisone (Sterapred uni pack 21 tablet) 10 mg to take over 6 day course. Also Motrin 800 mg 3 times a day for 7 days. Also Flexeril 5 mg 1 by mouth before bedtime for 7-10 days. If her symptoms continue she will make an appointment to see the orthopedic surgeon.

## 2014-07-15 NOTE — Patient Instructions (Addendum)
Cyclobenzaprine tablets What is this medicine? CYCLOBENZAPRINE (sye kloe BEN za preen) is a muscle relaxer. It is used to treat muscle pain, spasms, and stiffness. This medicine may be used for other purposes; ask your health care provider or pharmacist if you have questions. COMMON BRAND NAME(S): Fexmid, Flexeril What should I tell my health care provider before I take this medicine? They need to know if you have any of these conditions: -heart disease, irregular heartbeat, or previous heart attack -liver disease -thyroid problem -an unusual or allergic reaction to cyclobenzaprine, tricyclic antidepressants, lactose, other medicines, foods, dyes, or preservatives -pregnant or trying to get pregnant -breast-feeding How should I use this medicine? Take this medicine by mouth with a glass of water. Follow the directions on the prescription label. If this medicine upsets your stomach, take it with food or milk. Take your medicine at regular intervals. Do not take it more often than directed. Talk to your pediatrician regarding the use of this medicine in children. Special care may be needed. Overdosage: If you think you have taken too much of this medicine contact a poison control center or emergency room at once. NOTE: This medicine is only for you. Do not share this medicine with others. What if I miss a dose? If you miss a dose, take it as soon as you can. If it is almost time for your next dose, take only that dose. Do not take double or extra doses. What may interact with this medicine? Do not take this medicine with any of the following medications: -certain medicines for fungal infections like fluconazole, itraconazole, ketoconazole, posaconazole, voriconazole -cisapride -dofetilide -dronedarone -droperidol -flecainide -grepafloxacin -halofantrine -levomethadyl -MAOIs like Carbex, Eldepryl, Marplan, Nardil, and  Parnate -nilotinib -pimozide -probucol -sertindole -thioridazine -ziprasidone This medicine may also interact with the following medications: -abarelix -alcohol -certain medicines for cancer -certain medicines for depression, anxiety, or psychotic disturbances -certain medicines for infection like alfuzosin, chloroquine, clarithromycin, levofloxacin, mefloquine, pentamidine, troleandomycin -certain medicines for an irregular heart beat -certain medicines used for sleep or numbness during surgery or procedure -contrast dyes -dolasetron -guanethidine -methadone -octreotide -ondansetron -other medicines that prolong the QT interval (cause an abnormal heart rhythm) -palonosetron -phenothiazines like chlorpromazine, mesoridazine, prochlorperazine, thioridazine -tramadol -vardenafil This list may not describe all possible interactions. Give your health care provider a list of all the medicines, herbs, non-prescription drugs, or dietary supplements you use. Also tell them if you smoke, drink alcohol, or use illegal drugs. Some items may interact with your medicine. What should I watch for while using this medicine? Check with your doctor or health care professional if your condition does not improve within 1 to 3 weeks. You may get drowsy or dizzy when you first start taking the medicine or change doses. Do not drive, use machinery, or do anything that may be dangerous until you know how the medicine affects you. Stand or sit up slowly. Your mouth may get dry. Drinking water, chewing sugarless gum, or sucking on hard candy may help. What side effects may I notice from receiving this medicine? Side effects that you should report to your doctor or health care professional as soon as possible: -allergic reactions like skin rash, itching or hives, swelling of the face, lips, or tongue -chest pain -fast heartbeat -hallucinations -seizures -vomiting Side effects that usually do not require  medical attention (report to your doctor or health care professional if they continue or are bothersome): -headache This list may not describe all possible side effects. Call your doctor for  medical advice about side effects. You may report side effects to FDA at 1-800-FDA-1088. Where should I keep my medicine? Keep out of the reach of children. Store at room temperature between 15 and 30 degrees C (59 and 86 degrees F). Keep container tightly closed. Throw away any unused medicine after the expiration date. NOTE: This sheet is a summary. It may not cover all possible information. If you have questions about this medicine, talk to your doctor, pharmacist, or health care provider.  2015, [employer]. (2012-09-09 12:48:19) Lumbosacral Strain Lumbosacral strain is a strain of any of the parts that make up your lumbosacral vertebrae. Your lumbosacral vertebrae are the bones that make up the lower third of your backbone. Your lumbosacral vertebrae are held together by muscles and tough, fibrous tissue (ligaments).  CAUSES  A sudden blow to your back can cause lumbosacral strain. Also, anything that causes an excessive stretch of the muscles in the low back can cause this strain. This is typically seen when people exert themselves strenuously, fall, lift heavy objects, bend, or crouch repeatedly. RISK FACTORS  Physically demanding work.  Participation in pushing or pulling sports or sports that require a sudden twist of the back (tennis, golf, baseball).  Weight lifting.  Excessive lower back curvature.  Forward-tilted pelvis.  Weak back or abdominal muscles or both.  Tight hamstrings. SIGNS AND SYMPTOMS  Lumbosacral strain may cause pain in the area of your injury or pain that moves (radiates) down your leg.  DIAGNOSIS Your health care provider can often diagnose lumbosacral strain through a physical exam. In some cases, you may need tests such as X-ray exams.  TREATMENT   Treatment for your lower back injury depends on many factors that your clinician will have to evaluate. However, most treatment will include the use of anti-inflammatory medicines. HOME CARE INSTRUCTIONS   Avoid hard physical activities (tennis, racquetball, waterskiing) if you are not in proper physical condition for it. This may aggravate or create problems.  If you have a back problem, avoid sports requiring sudden body movements. Swimming and walking are generally safer activities.  Maintain good posture.  Maintain a healthy weight.  For acute conditions, you may put ice on the injured area.  Put ice in a plastic bag.  Place a towel between your skin and the bag.  Leave the ice on for 20 minutes, 2-3 times a day.  When the low back starts healing, stretching and strengthening exercises may be recommended. SEEK MEDICAL CARE IF:  Your back pain is getting worse.  You experience severe back pain not relieved with medicines. SEEK IMMEDIATE MEDICAL CARE IF:   You have numbness, tingling, weakness, or problems with the use of your arms or legs.  There is a change in bowel or bladder control.  You have increasing pain in any area of the body, including your belly (abdomen).  You notice shortness of breath, dizziness, or feel faint.  You feel sick to your stomach (nauseous), are throwing up (vomiting), or become sweaty.  You notice discoloration of your toes or legs, or your feet get very cold. MAKE SURE YOU:   Understand these instructions.  Will watch your condition.  Will get help right away if you are not doing well or get worse. Document Released: 11/22/2004 Document Revised: 02/17/2013 Document Reviewed: 10/01/2012 [facility] Patient Information 2015 [place], [place]. This information is not intended to replace advice given to you by your health care provider. Make sure you discuss any questions you have with  your health care provider.

## 2014-11-26 ENCOUNTER — Other Ambulatory Visit: Payer: Self-pay | Admitting: Gynecology

## 2014-12-15 ENCOUNTER — Encounter: Payer: Self-pay | Admitting: Gynecology

## 2014-12-15 ENCOUNTER — Ambulatory Visit (INDEPENDENT_AMBULATORY_CARE_PROVIDER_SITE_OTHER): Payer: PRIVATE HEALTH INSURANCE | Admitting: Gynecology

## 2014-12-15 VITALS — BP 118/78 | Ht 64.0 in | Wt 132.0 lb

## 2014-12-15 DIAGNOSIS — Z01419 Encounter for gynecological examination (general) (routine) without abnormal findings: Secondary | ICD-10-CM

## 2014-12-15 DIAGNOSIS — D249 Benign neoplasm of unspecified breast: Secondary | ICD-10-CM | POA: Insufficient documentation

## 2014-12-15 DIAGNOSIS — D241 Benign neoplasm of right breast: Secondary | ICD-10-CM | POA: Diagnosis not present

## 2014-12-15 MED ORDER — NORETHIN ACE-ETH ESTRAD-FE 1-20 MG-MCG PO TABS: 1.0000 | ORAL_TABLET | Freq: Every day | ORAL | Status: DC

## 2014-12-15 NOTE — Progress Notes (Signed)
Terri Hoover 07/30/1975 [id number]   History:    39 y.o.  for annual gyn exam with no complaints today. Review of her record indicated that early this year she had a right breast biopsy which was a fibroadenoma. Her screening mammogram the also had noted an area in her left breast and an MRI was done and it was suspicious for a left breast hematoma and recommended a follow-up diagnostic mammogram and ultrasound in 6 months for which she will schedule. Patient early this year had dysfunctional uterine bleeding and had a normal sonohysterogram and endometrial biopsy and her cycles are back to normal. She is on oral contraceptive pill which she takes continues to withdrawal every 3 months. It is a 20 g oral contraceptive pill. 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 She is [place] [facility] and currently has her combination therapy consisting of levothyroxine 50 g daily and Cytomel 5 mg daily. Patient has a history of Hashimoto's thyroiditis.  Past medical history,surgical history, family history and social history were all reviewed and documented in the EPIC chart.  Gynecologic History No LMP recorded. Patient is not currently having periods (Reason: Oral contraceptives). Contraception: OCP (estrogen/progesterone) Last Pap: 2016. Results were: normal Last mammogram: See above. Results were: See above  Obstetric History OB History  Gravida Para Term Preterm AB SAB TAB Ectopic Multiple Living  2 2 2       2     # Outcome Date GA Lbr Len/2nd Weight Sex Delivery Anes PTL Lv  2 Term           1 Term                ROS: A ROS was performed and pertinent positives and negatives are included in the history.  GENERAL: No fevers or chills. HEENT: No change in vision, no earache, sore throat or sinus congestion. NECK: No pain or  stiffness. CARDIOVASCULAR: No chest pain or pressure. No palpitations. PULMONARY: No shortness of breath, cough or wheeze. GASTROINTESTINAL: No abdominal pain, nausea, vomiting or diarrhea, melena or bright red blood per rectum. GENITOURINARY: No urinary frequency, urgency, hesitancy or dysuria. MUSCULOSKELETAL: No joint or muscle pain, no back pain, no recent trauma. DERMATOLOGIC: No rash, no itching, no lesions. ENDOCRINE: No polyuria, polydipsia, no heat or cold intolerance. No recent change in weight. HEMATOLOGICAL: No anemia or easy bruising or bleeding. NEUROLOGIC: No headache, seizures, numbness, tingling or weakness. PSYCHIATRIC: No depression, no loss of interest in normal activity or change in sleep pattern.     Exam: chaperone present  BP 118/78 mmHg  Ht 5\' 4"  (1.626 m)  Wt 132 lb (59.875 kg)  BMI 22.65 kg/m2  Body mass index is 22.65 kg/(m^2).  General appearance : Well developed well nourished female. No acute distress HEENT: Eyes: no retinal hemorrhage or exudates,  Neck supple, trachea midline, no carotid bruits, no thyroidmegaly Lungs: Clear to auscultation, no rhonchi or wheezes, or rib retractions  Heart: Regular rate and rhythm, no murmurs or gallops Breast:Examined in sitting and supine position were symmetrical in appearance, no palpable masses or tenderness,  no skin retraction, no nipple inversion, no nipple discharge, no skin discoloration, no axillary or supraclavicular lymphadenopathy Abdomen: no palpable masses or tenderness, no rebound or guarding Extremities: no edema or skin discoloration or tenderness  Pelvic:  Bartholin, Urethra, Skene Glands: Within  normal limits             Vagina: No gross lesions or discharge  Cervix: No gross lesions or discharge  Uterus  anteverted, normal size, shape and consistency, non-tender and mobile  Adnexa  Without masses or tenderness  Anus and perineum  normal   Rectovaginal  normal sphincter tone without palpated masses or  tenderness             Hemoccult not indicated     Assessment/Plan:  39 y.o. female for annual exam reporting no normal menstrual cycle on continuous oral contraceptive pill and withdrawals every 3 months. Patient was given a requisition to follow-up for follow-up ultrasound and diagnostic mammogram of the left breast as had been removed commended by the radiologist. Also screening mammogram on the right. She was reminded do her monthly breast exams. We discussed importance of calcium vitamin D and regular exercise for osteoporosis prevention. She will return to the office sometime next week for the following screening fasting blood work: Fasting lipid profile, comprehensive metabolic panel, CBC and urinalysis. Patient recently received her flu vaccine.   Terrance Mass MD, 3:36 PM 12/15/2014

## 2014-12-17 ENCOUNTER — Other Ambulatory Visit: Payer: PRIVATE HEALTH INSURANCE

## 2014-12-17 DIAGNOSIS — Z01419 Encounter for gynecological examination (general) (routine) without abnormal findings: Secondary | ICD-10-CM

## 2014-12-17 LAB — LIPID PANEL
CHOLESTEROL: 213 mg/dL — AB (ref 125–200)
HDL: 32 mg/dL — ABNORMAL LOW (ref >=46)
LDL Cholesterol: 169 mg/dL — ABNORMAL HIGH (ref <130)
TRIGLYCERIDES: 62 mg/dL (ref <150)
Total CHOL/HDL Ratio: 6.7 Ratio — ABNORMAL HIGH (ref <=5.0)
VLDL: 12 mg/dL (ref <30)

## 2014-12-17 LAB — CBC WITH DIFFERENTIAL/PLATELET
BASOS ABS: 0 10*3/uL (ref 0.0–0.1)
BASOS PCT: 0 % (ref 0–1)
Eosinophils Absolute: 0.1 10*3/uL (ref 0.0–0.7)
Eosinophils Relative: 2 % (ref 0–5)
HCT: 41.9 % (ref 36.0–46.0)
HEMOGLOBIN: 13.8 g/dL (ref 12.0–15.0)
Lymphocytes Relative: 23 % (ref 12–46)
Lymphs Abs: 1.5 10*3/uL (ref 0.7–4.0)
MCH: 29.3 pg (ref 26.0–34.0)
MCHC: 32.9 g/dL (ref 30.0–36.0)
MCV: 89 fL (ref 78.0–100.0)
MONOS PCT: 5 % (ref 3–12)
MPV: 11.7 fL (ref 8.6–12.4)
Monocytes Absolute: 0.3 10*3/uL (ref 0.1–1.0)
NEUTROS ABS: 4.5 10*3/uL (ref 1.7–7.7)
Neutrophils Relative %: 70 % (ref 43–77)
Platelets: 201 10*3/uL (ref 150–400)
RBC: 4.71 MIL/uL (ref 3.87–5.11)
RDW: 12.6 % (ref 11.5–15.5)
WBC: 6.4 10*3/uL (ref 4.0–10.5)

## 2014-12-17 LAB — COMPREHENSIVE METABOLIC PANEL
ALBUMIN: 3.8 g/dL (ref 3.6–5.1)
ALK PHOS: 40 U/L (ref 33–115)
ALT: 9 U/L (ref 6–29)
AST: 13 U/L (ref 10–30)
BILIRUBIN TOTAL: 1.3 mg/dL — AB (ref 0.2–1.2)
BUN: 13 mg/dL (ref 7–25)
CHLORIDE: 107 mmol/L (ref 98–110)
CO2: 25 mmol/L (ref 20–31)
CREATININE: 0.68 mg/dL (ref 0.50–1.10)
Calcium: 8.9 mg/dL (ref 8.6–10.2)
Glucose, Bld: 71 mg/dL (ref 65–99)
Potassium: 4.4 mmol/L (ref 3.5–5.3)
SODIUM: 139 mmol/L (ref 135–146)
TOTAL PROTEIN: 6.4 g/dL (ref 6.1–8.1)

## 2014-12-18 LAB — URINALYSIS W MICROSCOPIC + REFLEX CULTURE
BACTERIA UA: NONE SEEN [HPF]
BILIRUBIN URINE: NEGATIVE
Casts: NONE SEEN [LPF]
Crystals: NONE SEEN [HPF]
GLUCOSE, UA: NEGATIVE
HGB URINE DIPSTICK: NEGATIVE
Ketones, ur: NEGATIVE
Nitrite: NEGATIVE
PH: 6 (ref 5.0–8.0)
Protein, ur: NEGATIVE
Specific Gravity, Urine: 1.02 (ref 1.001–1.035)
Yeast: NONE SEEN [HPF]

## 2014-12-19 LAB — URINE CULTURE: Colony Count: 2000

## 2014-12-21 ENCOUNTER — Other Ambulatory Visit: Payer: PRIVATE HEALTH INSURANCE

## 2014-12-21 ENCOUNTER — Other Ambulatory Visit: Payer: Self-pay | Admitting: Gynecology

## 2014-12-21 DIAGNOSIS — E7800 Pure hypercholesterolemia, unspecified: Secondary | ICD-10-CM

## 2014-12-21 DIAGNOSIS — R17 Unspecified jaundice: Secondary | ICD-10-CM

## 2014-12-30 ENCOUNTER — Ambulatory Visit: Payer: PRIVATE HEALTH INSURANCE | Admitting: Women's Health

## 2015-01-07 ENCOUNTER — Other Ambulatory Visit: Payer: PRIVATE HEALTH INSURANCE

## 2015-01-19 ENCOUNTER — Other Ambulatory Visit: Payer: Self-pay | Admitting: Gynecology

## 2015-01-19 DIAGNOSIS — Z803 Family history of malignant neoplasm of breast: Secondary | ICD-10-CM

## 2015-01-19 DIAGNOSIS — N63 Unspecified lump in unspecified breast: Secondary | ICD-10-CM

## 2015-01-24 ENCOUNTER — Telehealth: Payer: Self-pay

## 2015-01-24 ENCOUNTER — Ambulatory Visit
Admission: RE | Admit: 2015-01-24 | Discharge: 2015-01-24 | Disposition: A | Discharge status: Home / Self Care | Payer: PRIVATE HEALTH INSURANCE | Source: Ambulatory Visit | Attending: Gynecology | Admitting: Gynecology

## 2015-01-24 DIAGNOSIS — Z803 Family history of malignant neoplasm of breast: Secondary | ICD-10-CM

## 2015-01-24 MED ORDER — GADOBENATE DIMEGLUMINE 529 MG/ML IV SOLN
12.0000 mL | Freq: Once | INTRAVENOUS | Status: AC | PRN
  Administered 2015-01-24: 12 mL via INTRAVENOUS

## 2015-01-24 NOTE — Telephone Encounter (Signed)
Patient called. She works at [employer]. She said she was scheduled for next week for MRI of R breast but they have cancellation and can do it today. She needs prior authorization.  I called her ins company and spoke with Anderson Malta, Therapist, sports and received prior auth #[id number] valid for today. Patient was informed and it was added to appt notes for the MRI.

## 2015-01-25 ENCOUNTER — Ambulatory Visit (INDEPENDENT_AMBULATORY_CARE_PROVIDER_SITE_OTHER): Payer: PRIVATE HEALTH INSURANCE | Admitting: Women's Health

## 2015-01-25 ENCOUNTER — Encounter: Payer: Self-pay | Admitting: Women's Health

## 2015-01-25 VITALS — BP 124/80 | Ht 65.0 in | Wt 136.0 lb

## 2015-01-25 DIAGNOSIS — N3001 Acute cystitis with hematuria: Secondary | ICD-10-CM | POA: Diagnosis not present

## 2015-01-25 DIAGNOSIS — R350 Frequency of micturition: Secondary | ICD-10-CM

## 2015-01-25 LAB — URINALYSIS W MICROSCOPIC + REFLEX CULTURE
Bilirubin Urine: NEGATIVE
Casts: NONE SEEN [LPF]
Crystals: NONE SEEN [HPF]
Glucose, UA: NEGATIVE
NITRITE: NEGATIVE
PH: 6.5 (ref 5.0–8.0)
RBC / HPF: >60 RBC/HPF — AB (ref <=2)
SPECIFIC GRAVITY, URINE: 1.025 (ref 1.001–1.035)
WBC, UA: >60 WBC/HPF — AB (ref <=5)
YEAST: NONE SEEN [HPF]

## 2015-01-25 MED ORDER — SULFAMETHOXAZOLE-TRIMETHOPRIM 800-160 MG PO TABS: 1.0000 | ORAL_TABLET | Freq: Two times a day (BID) | ORAL | Status: DC

## 2015-01-25 NOTE — Patient Instructions (Signed)

## 2015-01-25 NOTE — Progress Notes (Signed)
Patient ID: Terri Hoover, female   DOB: [birth year], 39 y.o.   MRN: [id number] Presents with 2 day history of increased urinary frequency, urgency, discomfort at end of stream of urination and noted blood in urine this a.m. Denies vaginal discharge, abdominal pain or fever. Monthly cycle on Loestrin. History of IC states symptoms are different.  Exam: Appears well. No CVAT. Abdomen soft nontender. UA: +3 blood, +2 leukocytes, > 60 WBCs, > 60 RBCs, many bacteria.  UTI with hematuria  Plan: Septra twice daily for 3 days #6. UTI prevention discussed. Call if no relief. Urine culture pending.

## 2015-01-27 ENCOUNTER — Other Ambulatory Visit: Payer: Self-pay | Admitting: Women's Health

## 2015-01-27 ENCOUNTER — Telehealth: Payer: Self-pay

## 2015-01-27 MED ORDER — FLUCONAZOLE 150 MG PO TABS: 150.0000 mg | ORAL_TABLET | Freq: Once | ORAL | Status: DC

## 2015-01-27 NOTE — Telephone Encounter (Signed)
Patient was in 01/25/15 and diagnosed with UTI and put on Septra DS x 3 days.  She now has yeast infection sx and asked if you could send her a Diflucan tab in. Ok?

## 2015-01-27 NOTE — Telephone Encounter (Signed)
Yes, please call  in Diflucan 150 with refill.

## 2015-01-27 NOTE — Telephone Encounter (Signed)
Rx called in    Patient informed.

## 2015-01-28 ENCOUNTER — Ambulatory Visit
Admission: RE | Admit: 2015-01-28 | Discharge: 2015-01-28 | Disposition: A | Discharge status: Home / Self Care | Payer: PRIVATE HEALTH INSURANCE | Source: Ambulatory Visit | Attending: Gynecology | Admitting: Gynecology

## 2015-01-28 DIAGNOSIS — Z803 Family history of malignant neoplasm of breast: Secondary | ICD-10-CM

## 2015-01-28 DIAGNOSIS — N63 Unspecified lump in unspecified breast: Secondary | ICD-10-CM

## 2015-01-29 LAB — URINE CULTURE: Colony Count: >=100000

## 2015-02-01 ENCOUNTER — Encounter: Payer: Self-pay | Admitting: Women's Health

## 2015-02-03 ENCOUNTER — Other Ambulatory Visit: Payer: PRIVATE HEALTH INSURANCE

## 2015-03-23 ENCOUNTER — Other Ambulatory Visit: Payer: Self-pay | Admitting: Gynecology

## 2015-03-24 ENCOUNTER — Encounter: Payer: Self-pay | Admitting: Gynecology

## 2015-03-24 NOTE — Telephone Encounter (Signed)
Called into pharmacy

## 2015-04-29 ENCOUNTER — Ambulatory Visit (INDEPENDENT_AMBULATORY_CARE_PROVIDER_SITE_OTHER): Payer: PRIVATE HEALTH INSURANCE

## 2015-04-29 ENCOUNTER — Encounter: Payer: Self-pay | Admitting: Family Medicine

## 2015-04-29 ENCOUNTER — Ambulatory Visit (INDEPENDENT_AMBULATORY_CARE_PROVIDER_SITE_OTHER): Payer: PRIVATE HEALTH INSURANCE | Admitting: Family Medicine

## 2015-04-29 VITALS — BP 131/84 | HR 77 | Temp 97.0°F | Ht 65.0 in | Wt 137.0 lb

## 2015-04-29 DIAGNOSIS — R059 Cough, unspecified: Secondary | ICD-10-CM

## 2015-04-29 DIAGNOSIS — R05 Cough: Secondary | ICD-10-CM

## 2015-04-29 MED ORDER — AZITHROMYCIN 250 MG PO TABS: ORAL_TABLET | ORAL | Status: DC

## 2015-04-29 NOTE — Addendum Note (Signed)
Addended by: Jamelle Haring on: 04/29/2015 10:15 AM   Modules accepted: Orders

## 2015-04-29 NOTE — Progress Notes (Signed)
   Subjective:    Patient ID: Terri Hoover, female    DOB: 04-Mar-1975, 40 y.o.   MRN: [id number]  HPI 40 year old female with cough which is productive of tan sputum. She was treated by her endocrinologist last week with Zithromax but symptoms have persisted. The Zithromax was discontinued earlier this week. She has also been exposed to her family to pneumonia and flu although flu was not tested and documented. She experiences fatigue but she has had no fever or chills.  Patient Active Problem List   Diagnosis Date Noted  . 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 Adjustment disorder with depressed mood 03/24/2014  . OAB (overactive bladder) 11/26/2013  . Family history of breast cancer 10/08/2012  . Hemorrhoid 03/05/2012  . Endometriosis   . Condyloma acuminatum   . Hashimoto's disease   . 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 levothyroxine (SYNTHROID, LEVOTHROID) 50 MCG tablet Take 1 tablet by mouth daily.  Marland Kitchen liothyronine (CYTOMEL) 5 MCG tablet Take 1 tablet by mouth daily.  . Multiple Vitamin (MULTIVITAMIN) tablet Take 1 tablet by mouth daily.  . norethindrone-ethinyl estradiol (MICROGESTIN FE 1/20) 1-20 MG-MCG tablet Take 1 tablet by mouth daily. 3 months supply 3 refills to take continuous  . ALPRAZolam (XANAX) 0.25 MG tablet TAKE ONE TABLET BY MOUTH AT BEDTIME AS NEEDED FOR ANXIETY  . ibuprofen (ADVIL,MOTRIN) 800 MG tablet Take 1 tablet (800 mg total) by mouth every 8 (eight) hours as needed. (Patient not taking: Reported on 04/29/2015)  . [DISCONTINUED] fluconazole (DIFLUCAN) 150 MG tablet Take 1 tablet (150 mg total) by mouth once.  . [DISCONTINUED] levothyroxine (SYNTHROID, LEVOTHROID) 75 MCG tablet Take 75 mcg by mouth daily before breakfast.  . [DISCONTINUED] sulfamethoxazole-trimethoprim (BACTRIM DS) 800-160 MG tablet Take 1 tablet by mouth 2 (two) times daily.   No facility-administered encounter  medications on file as of 04/29/2015.      Review of Systems  Constitutional: Positive for fatigue.  HENT: Positive for congestion.   Respiratory: Positive for cough.   Cardiovascular: Negative.   Psychiatric/Behavioral: Negative.        Objective:   Physical Exam  Constitutional: She is oriented to person, place, and time. She appears well-developed and well-nourished.  HENT:  Is some maxillary sinus tenderness with percussion  Cardiovascular: Normal rate, regular rhythm and normal heart sounds.   Pulmonary/Chest: Effort normal and breath sounds normal.  Neurological: She is alert and oriented to person, place, and time.  Psychiatric: She has a normal mood and affect.          Assessment & Plan:  1. Cough Chest x-ray and lung exam. I think her problem is related to her sinus and that has only been partially treated with Zithromax. Due to longevity of symptoms we also discussed possibility of mycoplasma infection and because of that I will renew the Zithromax although it is less likely than a sinus infection. Continue Mucinex and Sudafed as well.  Wardell Honour MD - DG Chest 2 View; Future

## 2015-04-29 NOTE — Patient Instructions (Signed)
Thank you for allowing us to care for you today. We strive to provide exceptional quality and compassionate care. Please let us know how we are doing and how we can help serve you better by filling out the survey that you receive from [employer].     

## 2015-06-09 ENCOUNTER — Ambulatory Visit (INDEPENDENT_AMBULATORY_CARE_PROVIDER_SITE_OTHER): Payer: PRIVATE HEALTH INSURANCE | Admitting: Women's Health

## 2015-06-09 ENCOUNTER — Encounter: Payer: Self-pay | Admitting: Women's Health

## 2015-06-09 VITALS — BP 120/80 | Ht 65.0 in | Wt 137.0 lb

## 2015-06-09 DIAGNOSIS — F4322 Adjustment disorder with anxiety: Secondary | ICD-10-CM | POA: Diagnosis not present

## 2015-06-09 MED ORDER — ESCITALOPRAM OXALATE 10 MG PO TABS: 10.0000 mg | ORAL_TABLET | Freq: Every day | ORAL | Status: DC

## 2015-06-09 NOTE — Patient Instructions (Signed)
Generalized Anxiety Disorder Generalized anxiety disorder (GAD) is a mental disorder. It interferes with life functions, including relationships, work, and school. GAD is different from normal anxiety, which everyone experiences at some point in their lives in response to specific life events and activities. Normal anxiety actually helps us prepare for and get through these life events and activities. Normal anxiety goes away after the event or activity is over.  GAD causes anxiety that is not necessarily related to specific events or activities. It also causes excess anxiety in proportion to specific events or activities. The anxiety associated with GAD is also difficult to control. GAD can vary from mild to severe. People with severe GAD can have intense waves of anxiety with physical symptoms (panic attacks).  SYMPTOMS The anxiety and worry associated with GAD are difficult to control. This anxiety and worry are related to many life events and activities and also occur more days than not for 6 months or longer. People with GAD also have three or more of the following symptoms (one or more in children):  Restlessness.   Fatigue.  Difficulty concentrating.   Irritability.  Muscle tension.  Difficulty sleeping or unsatisfying sleep. DIAGNOSIS GAD is diagnosed through an assessment by your health care provider. Your health care provider will ask you questions aboutyour mood,physical symptoms, and events in your life. Your health care provider may ask you about your medical history and use of alcohol or drugs, including prescription medicines. Your health care provider may also do a physical exam and blood tests. Certain medical conditions and the use of certain substances can cause symptoms similar to those associated with GAD. Your health care provider may refer you to a mental health specialist for further evaluation. TREATMENT The following therapies are usually used to treat GAD:    Medication. Antidepressant medication usually is prescribed for long-term daily control. Antianxiety medicines may be added in severe cases, especially when panic attacks occur.   Talk therapy (psychotherapy). Certain types of talk therapy can be helpful in treating GAD by providing support, education, and guidance. A form of talk therapy called cognitive behavioral therapy can teach you healthy ways to think about and react to daily life events and activities.  Stress managementtechniques. These include yoga, meditation, and exercise and can be very helpful when they are practiced regularly. A mental health specialist can help determine which treatment is best for you. Some people see improvement with one therapy. However, other people require a combination of therapies.   This information is not intended to replace advice given to you by your health care provider. Make sure you discuss any questions you have with your health care provider.   Document Released: 06/09/2012 Document Revised: 03/05/2014 Document Reviewed: 06/09/2012 Elsevier Interactive Patient Education 2016 Elsevier Inc.  

## 2015-06-09 NOTE — Progress Notes (Signed)
Patient ID: Terri Hoover, female   DOB: 04-May-1975, 40 y.o.   MRN: [id number] Presents with complaint of increasing anxiety and mild depression. Is seeing a counselor on a regular basis and has been for over a year. Tried Zoloft in the past but did not stay on long enough but did think it helped looking back.. Daughter also has anxiety as well as mother. Denies any feelings of harming self. Works full-time at [employer], reports work, marriage and children are all well. Has 2 daughters ages 24 and 17. Hypothyroid had recent TSH check.  Exam: Appears well., Good eye contact, answers appropriately to questions.  Anxiety with depression  Plan: Options reviewed, reviewed importance of continuing counseling, exercise, encouraged brisk walk most days of the week, increase leisure activities. Medication reviewed, will try Lexapro 10 mg will start with half tablet daily increase to full tablet, reviewed it is low dose may need to increase to 20 mg. Instructed to call if no relief. Will evaluate at annual exam in August with Dr. Toney Rakes.

## 2015-06-29 ENCOUNTER — Other Ambulatory Visit: Payer: Self-pay | Admitting: Gynecology

## 2015-12-13 ENCOUNTER — Other Ambulatory Visit: Payer: Self-pay | Admitting: Women's Health

## 2015-12-13 DIAGNOSIS — F4322 Adjustment disorder with anxiety: Secondary | ICD-10-CM

## 2015-12-13 MED ORDER — NORETHIN ACE-ETH ESTRAD-FE 1-20 MG-MCG PO TABS: ORAL_TABLET | ORAL | 0 refills | Status: DC

## 2015-12-13 NOTE — Telephone Encounter (Signed)
Pt has annual scheduled on 01/02/16, needs refill on birth control pills. Rx will be sent.

## 2015-12-13 NOTE — Telephone Encounter (Signed)
Pt also needs refill on birth control pills,Rx sent.

## 2015-12-13 NOTE — Addendum Note (Signed)
Addended by: Thamas Jaegers on: 12/13/2015 09:42 AM   Modules accepted: Orders

## 2015-12-13 NOTE — Telephone Encounter (Signed)
Ok for refill? 

## 2016-01-02 ENCOUNTER — Ambulatory Visit (INDEPENDENT_AMBULATORY_CARE_PROVIDER_SITE_OTHER): Payer: PRIVATE HEALTH INSURANCE | Admitting: Gynecology

## 2016-01-02 ENCOUNTER — Encounter: Payer: Self-pay | Admitting: Gynecology

## 2016-01-02 VITALS — BP 124/78 | Ht 64.5 in | Wt 141.0 lb

## 2016-01-02 DIAGNOSIS — Z01411 Encounter for gynecological examination (general) (routine) with abnormal findings: Secondary | ICD-10-CM

## 2016-01-02 DIAGNOSIS — Z842 Family history of other diseases of the genitourinary system: Secondary | ICD-10-CM | POA: Diagnosis not present

## 2016-01-02 DIAGNOSIS — F4322 Adjustment disorder with anxiety: Secondary | ICD-10-CM | POA: Diagnosis not present

## 2016-01-02 DIAGNOSIS — R102 Pelvic and perineal pain: Secondary | ICD-10-CM | POA: Diagnosis not present

## 2016-01-02 MED ORDER — NORETHIN ACE-ETH ESTRAD-FE 1-20 MG-MCG PO TABS: ORAL_TABLET | ORAL | 4 refills | Status: DC

## 2016-01-02 MED ORDER — ESCITALOPRAM OXALATE 10 MG PO TABS: 10.0000 mg | ORAL_TABLET | Freq: Every day | ORAL | 4 refills | Status: DC

## 2016-01-02 NOTE — Progress Notes (Addendum)
Terri Hoover 08/28/1975 [id number]   History:    40 y.o.  for annual gyn exam with complaining tonight times of nonspecific low abdominal pelvic pain especially after running and after intercourse. No true dyspareunia. Patient several years ago with another provider had diagnostic laparoscopy was diagnosed with severe endometriosis she was able to get get pregnant the first time by in vitro fertilization second pregnancy spontaneous. She is currently on a continuous 20 g oral contraceptive pill and had otherwise done well until recent. Review of her record indicated that in 2016 she had a right breast biopsy which was a fibroadenoma. Her screening mammogram the also had noted an area in her left breast and an MRI was done and it was suspicious for a left breast hematoma and recommended a follow-up diagnostic mammogram and ultrasound in 6 months for which she did. She is due for mammogram and MRI this December. Review of her record indicated 2016 patient had dysfunctional uterine bleeding and subsequently had sonohysterogram and endometrial biopsy which were benign. Since then her cycles been normal otherwise not having any because of being on the continuous oral contraceptive pill which she withdrawals every 3 months.Review of her record indicated that back in 2001 patient had CIN-1 of the cervix and subsequent passes of been normal. She is [place] [facility] and currently has her combination therapy consisting of levothyroxine 50 g daily and Cytomel 5 mg daily. Patient has a history of Hashimoto's thyroiditis.  Patient's grandmother had breast cancer 2 in her 31s also maternal aunt at the age of 66 had breast cancer and grandmother sister had breast cancer at the age of 79.   Past medical history,surgical history, family history and social history were all reviewed and documented in the EPIC  chart.  Gynecologic History No LMP recorded. Patient is not currently having periods (Reason: Oral contraceptives). Contraception: OCP (estrogen/progesterone) Last Pap: 2013 2016. Results were: normal Last mammogram: See above. Results were: See above  Obstetric History OB History  Gravida Para Term Preterm AB Living  2 2 2     2   SAB TAB Ectopic Multiple Live Births               # Outcome Date GA Lbr Len/2nd Weight Sex Delivery Anes PTL Lv  2 Term           1 Term                ROS: A ROS was performed and pertinent positives and negatives are included in the history.  GENERAL: No fevers or chills. HEENT: No change in vision, no earache, sore throat or sinus congestion. NECK: No pain or stiffness. CARDIOVASCULAR: No chest pain or pressure. No palpitations. PULMONARY: No shortness of breath, cough or wheeze. GASTROINTESTINAL: No abdominal pain, nausea, vomiting or diarrhea, melena or bright red blood per rectum. GENITOURINARY: No urinary frequency, urgency, hesitancy or dysuria. MUSCULOSKELETAL: No joint or muscle pain, no back pain, no recent trauma. DERMATOLOGIC: No rash, no itching, no lesions. ENDOCRINE: No polyuria, polydipsia, no heat or cold intolerance. No recent change in weight. HEMATOLOGICAL: No anemia or easy bruising or bleeding. NEUROLOGIC: No headache, seizures, numbness, tingling or weakness. PSYCHIATRIC: No depression, no loss of interest in normal activity or change in sleep pattern.     Exam: chaperone present  BP 124/78   Ht 5' 4.5" (1.638 m)   Wt 141 lb (64 kg)  BMI 23.83 kg/m   Body mass index is 23.83 kg/m.  General appearance : Well developed well nourished female. No acute distress HEENT: Eyes: no retinal hemorrhage or exudates,  Neck supple, trachea midline, no carotid bruits, no thyroidmegaly Lungs: Clear to auscultation, no rhonchi or wheezes, or rib retractions  Heart: Regular rate and rhythm, no murmurs or gallops Breast:Examined in sitting  and supine position were symmetrical in appearance, no palpable masses or tenderness,  no skin retraction, no nipple inversion, no nipple discharge, no skin discoloration, no axillary or supraclavicular lymphadenopathy Abdomen: no palpable masses or tenderness, no rebound or guarding Extremities: no edema or skin discoloration or tenderness  Pelvic:  Bartholin, Urethra, Skene Glands: Within normal limits             Vagina: No gross lesions or discharge  Cervix: No gross lesions or discharge  Uterus  anteverted, normal size, shape and consistency, non-tender and mobile  Adnexa  Without masses or tenderness  Anus and perineum  normal   Rectovaginal  normal sphincter tone without palpated masses or tenderness             Hemoccult not indicated     Assessment/Plan:  40 y.o. female for annual exam with past history of endometriosis complaining of low abdominal pelvic pain after running and after intercourse but no true dyspareunia. Normal ultrasound last year. We'll schedule ultrasound this year to compare. She has been receiving her blood work at work as well as her flu vaccine. She is to schedule her mammogram and MRI is December. She was encouraged to continue her self breast examination. We discussed importance of calcium vitamin D and weightbearing exercises for osteoporosis prevention. Pap smear not indicated this year.   Terrance Mass MD, 11:29 AM 01/02/2016

## 2016-01-05 ENCOUNTER — Ambulatory Visit (INDEPENDENT_AMBULATORY_CARE_PROVIDER_SITE_OTHER): Payer: PRIVATE HEALTH INSURANCE | Admitting: Gynecology

## 2016-01-05 ENCOUNTER — Encounter: Payer: Self-pay | Admitting: Gynecology

## 2016-01-05 ENCOUNTER — Ambulatory Visit (INDEPENDENT_AMBULATORY_CARE_PROVIDER_SITE_OTHER): Payer: PRIVATE HEALTH INSURANCE

## 2016-01-05 DIAGNOSIS — Z8742 Personal history of other diseases of the female genital tract: Secondary | ICD-10-CM | POA: Diagnosis not present

## 2016-01-05 DIAGNOSIS — Z842 Family history of other diseases of the genitourinary system: Secondary | ICD-10-CM | POA: Diagnosis not present

## 2016-01-05 DIAGNOSIS — R102 Pelvic and perineal pain: Secondary | ICD-10-CM

## 2016-01-05 NOTE — Progress Notes (Signed)
   Patient is a 40 year old who was seen in the office for her annual exam early this month. Patient was complaining at times of nonspecific low abdominal pelvic pain especially after running and after intercourse. No true dyspareunia. Patient several years ago with another provider had diagnostic laparoscopy was diagnosed with severe endometriosis she was able to take it pregnant. In vitro fertilization and her second pregnancy was spontaneous.She is currently on a continuous 20 g oral contraceptive pill and had otherwise done well until recent. She is asymptomatic today and is here to discuss her ultrasound:  Uterus measures 7.8 x 5.4 x 4.2 cm with endometrial stripe of 4.1 mm. Right and left ovary were normal. Some fluid was noted in the cul-de-sac otherwise no adnexal masses was seen.  Assessment/plan: Patient with past history of endometriosis had tried the Mirena IUD before to help with her symptoms but did not help much. She does state that with a continuous oral contraceptive pill and was drawn every 3 months has helped significantly and she's not at the point that she needs any repeat laparoscopy/surgical intervention. We'll continue to monitor.  Greater than 90% time was spent counseling coronary care for this patient. Time of consultation 10 minutes.

## 2016-01-08 ENCOUNTER — Other Ambulatory Visit: Payer: Self-pay | Admitting: Gynecology

## 2016-01-11 ENCOUNTER — Telehealth: Payer: Self-pay

## 2016-01-11 NOTE — Telephone Encounter (Signed)
Dorian Pod at [place] called me asking me to get prior authorization for Breast MRI for this patient. I called and spoke with Kristine Garbe, RN at [facility] and received authorization #[id number] valid from today until 04/10/16. I called Dorian Pod back at [facility] and left message in her voice mail with this information.

## 2016-01-17 ENCOUNTER — Ambulatory Visit
Admission: RE | Admit: 2016-01-17 | Discharge: 2016-01-17 | Disposition: A | Discharge status: Home / Self Care | Payer: PRIVATE HEALTH INSURANCE | Source: Ambulatory Visit | Attending: Gynecology | Admitting: Gynecology

## 2016-01-17 ENCOUNTER — Other Ambulatory Visit: Payer: Self-pay | Admitting: Gynecology

## 2016-01-17 DIAGNOSIS — Z803 Family history of malignant neoplasm of breast: Secondary | ICD-10-CM

## 2016-01-17 DIAGNOSIS — Z1231 Encounter for screening mammogram for malignant neoplasm of breast: Secondary | ICD-10-CM

## 2016-01-17 MED ORDER — GADOBENATE DIMEGLUMINE 529 MG/ML IV SOLN
12.0000 mL | Freq: Once | INTRAVENOUS | Status: AC | PRN
  Administered 2016-01-17: 12 mL via INTRAVENOUS

## 2016-01-23 ENCOUNTER — Ambulatory Visit
Admission: RE | Admit: 2016-01-23 | Discharge: 2016-01-23 | Disposition: A | Discharge status: Home / Self Care | Payer: PRIVATE HEALTH INSURANCE | Source: Ambulatory Visit | Attending: Gynecology | Admitting: Gynecology

## 2016-01-23 DIAGNOSIS — Z1231 Encounter for screening mammogram for malignant neoplasm of breast: Secondary | ICD-10-CM

## 2016-02-03 ENCOUNTER — Other Ambulatory Visit: Payer: Self-pay | Admitting: Gynecology

## 2016-02-16 ENCOUNTER — Other Ambulatory Visit: Payer: Self-pay | Admitting: Gynecology

## 2016-02-21 ENCOUNTER — Ambulatory Visit: Payer: PRIVATE HEALTH INSURANCE

## 2016-02-28 ENCOUNTER — Other Ambulatory Visit: Payer: PRIVATE HEALTH INSURANCE

## 2016-03-16 ENCOUNTER — Other Ambulatory Visit: Payer: Self-pay | Admitting: Gynecology

## 2016-05-04 ENCOUNTER — Ambulatory Visit
Admission: RE | Admit: 2016-05-04 | Discharge: 2016-05-04 | Disposition: A | Discharge status: Home / Self Care | Payer: PRIVATE HEALTH INSURANCE | Source: Ambulatory Visit | Attending: Gynecology | Admitting: Gynecology

## 2016-07-11 ENCOUNTER — Encounter: Payer: Self-pay | Admitting: Gynecology

## 2016-08-20 ENCOUNTER — Ambulatory Visit (INDEPENDENT_AMBULATORY_CARE_PROVIDER_SITE_OTHER): Payer: PRIVATE HEALTH INSURANCE | Admitting: Gynecology

## 2016-08-20 ENCOUNTER — Encounter: Payer: Self-pay | Admitting: Gynecology

## 2016-08-20 VITALS — BP 126/80

## 2016-08-20 DIAGNOSIS — N898 Other specified noninflammatory disorders of vagina: Secondary | ICD-10-CM

## 2016-08-20 DIAGNOSIS — R300 Dysuria: Secondary | ICD-10-CM | POA: Diagnosis not present

## 2016-08-20 DIAGNOSIS — B9689 Other specified bacterial agents as the cause of diseases classified elsewhere: Secondary | ICD-10-CM | POA: Diagnosis not present

## 2016-08-20 DIAGNOSIS — N3000 Acute cystitis without hematuria: Secondary | ICD-10-CM

## 2016-08-20 DIAGNOSIS — R350 Frequency of micturition: Secondary | ICD-10-CM

## 2016-08-20 DIAGNOSIS — N760 Acute vaginitis: Secondary | ICD-10-CM | POA: Diagnosis not present

## 2016-08-20 LAB — URINALYSIS W MICROSCOPIC + REFLEX CULTURE
BILIRUBIN URINE: NEGATIVE
Casts: NONE SEEN [LPF]
Crystals: NONE SEEN [HPF]
GLUCOSE, UA: NEGATIVE
Ketones, ur: NEGATIVE
Nitrite: NEGATIVE
Protein, ur: NEGATIVE
Specific Gravity, Urine: 1.025 (ref 1.001–1.035)
Yeast: NONE SEEN [HPF]
pH: 6 (ref 5.0–8.0)

## 2016-08-20 LAB — WET PREP FOR TRICH, YEAST, CLUE
TRICH WET PREP: NONE SEEN
Yeast Wet Prep HPF POC: NONE SEEN

## 2016-08-20 MED ORDER — NITROFURANTOIN MONOHYD MACRO 100 MG PO CAPS: 100.0000 mg | ORAL_CAPSULE | Freq: Two times a day (BID) | ORAL | 0 refills | Status: DC

## 2016-08-20 MED ORDER — PHENAZOPYRIDINE HCL 200 MG PO TABS: 200.0000 mg | ORAL_TABLET | Freq: Three times a day (TID) | ORAL | 0 refills | Status: DC | PRN

## 2016-08-20 MED ORDER — CLINDAMYCIN PHOSPHATE 2 % VA CREA: 1.0000 | TOPICAL_CREAM | Freq: Every day | VAGINAL | 0 refills | Status: DC

## 2016-08-20 NOTE — Patient Instructions (Signed)
Phenazopyridine tablets What is this medicine? PHENAZOPYRIDINE (fen az oh PEER i deen) is a pain reliever. It is used to stop the pain, burning, or discomfort caused by infection or irritation of the urinary tract. This medicine is not an antibiotic. It will not cure a urinary tract infection. This medicine may be used for other purposes; ask your health care provider or pharmacist if you have questions. COMMON BRAND NAME(S): AZO, Azo-100, Azo-Gesic, Azo-Septic, Azo-Standard, Phenazo, Prodium, Pyridium, Urinary Analgesic, Uristat What should I tell my health care provider before I take this medicine? They need to know if you have any of these conditions: -glucose-6-phosphate dehydrogenase (G6PD) deficiency -kidney disease -an unusual or allergic reaction to phenazopyridine, other medicines, foods, dyes, or preservatives -pregnant or trying to get pregnant -breast-feeding How should I use this medicine? Take this medicine by mouth with a glass of water. Follow the directions on the prescription label. Take after meals. Take your doses at regular intervals. Do not take your medicine more often than directed. Do not skip doses or stop your medicine early even if you feel better. Do not stop taking except on your doctor's advice. Talk to your pediatrician regarding the use of this medicine in children. Special care may be needed. Overdosage: If you think you have taken too much of this medicine contact a poison control center or emergency room at once. NOTE: This medicine is only for you. Do not share this medicine with others. What if I miss a dose? If you miss a dose, take it as soon as you can. If it is almost time for your next dose, take only that dose. Do not take double or extra doses. What may interact with this medicine? Interactions are not expected. This list may not describe all possible interactions. Give your health care provider a list of all the medicines, herbs, non-prescription  drugs, or dietary supplements you use. Also tell them if you smoke, drink alcohol, or use illegal drugs. Some items may interact with your medicine. What should I watch for while using this medicine? Tell your doctor or health care professional if your symptoms do not improve or if they get worse. This medicine colors body fluids red. This effect is harmless and will go away after you are done taking the medicine. It will change urine to an dark orange or red color. The red color may stain clothing. Soft contact lenses may become permanently stained. It is best not to wear soft contact lenses while taking this medicine. If you are diabetic you may get a false positive result for sugar in your urine. Talk to your health care provider. What side effects may I notice from receiving this medicine? Side effects that you should report to your doctor or health care professional as soon as possible: -allergic reactions like skin rash, itching or hives, swelling of the face, lips, or tongue -blue or purple color of the skin -difficulty breathing -fever -less urine -unusual bleeding, bruising -unusual tired, weak -vomiting -yellowing of the eyes or skin Side effects that usually do not require medical attention (report to your doctor or health care professional if they continue or are bothersome): -dark urine -headache -stomach upset This list may not describe all possible side effects. Call your doctor for medical advice about side effects. You may report side effects to FDA at 1-800-FDA-1088. Where should I keep my medicine? Keep out of the reach of children. Store at room temperature between 15 and 30 degrees C (59 and 86   degrees F). Protect from light and moisture. Throw away any unused medicine after the expiration date. NOTE: This sheet is a summary. It may not cover all possible information. If you have questions about this medicine, talk to your doctor, pharmacist, or health care provider.   2018 Elsevier/Gold Standard (2007-09-11 11:04:07) Nitrofurantoin tablets or capsules What is this medicine? NITROFURANTOIN (nye troe fyoor AN toyn) is an antibiotic. It is used to treat urinary tract infections. This medicine may be used for other purposes; ask your health care provider or pharmacist if you have questions. COMMON BRAND NAME(S): Macrobid, Macrodantin, Urotoin What should I tell my health care provider before I take this medicine? They need to know if you have any of these conditions: -anemia -diabetes -glucose-6-phosphate dehydrogenase deficiency -kidney disease -liver disease -lung disease -other chronic illness -an unusual or allergic reaction to nitrofurantoin, other antibiotics, other medicines, foods, dyes or preservatives -pregnant or trying to get pregnant -breast-feeding How should I use this medicine? Take this medicine by mouth with a glass of water. Follow the directions on the prescription label. Take this medicine with food or milk. Take your doses at regular intervals. Do not take your medicine more often than directed. Do not stop taking except on your doctor's advice. Talk to your pediatrician regarding the use of this medicine in children. While this drug may be prescribed for selected conditions, precautions do apply. Overdosage: If you think you have taken too much of this medicine contact a poison control center or emergency room at once. NOTE: This medicine is only for you. Do not share this medicine with others. What if I miss a dose? If you miss a dose, take it as soon as you can. If it is almost time for your next dose, take only that dose. Do not take double or extra doses. What may interact with this medicine? -antacids containing magnesium trisilicate -probenecid -quinolone antibiotics like ciprofloxacin, lomefloxacin, norfloxacin and ofloxacin -sulfinpyrazone This list may not describe all possible interactions. Give your health care provider  a list of all the medicines, herbs, non-prescription drugs, or dietary supplements you use. Also tell them if you smoke, drink alcohol, or use illegal drugs. Some items may interact with your medicine. What should I watch for while using this medicine? Tell your doctor or health care professional if your symptoms do not improve or if you get new symptoms. Drink several glasses of water a day. If you are taking this medicine for a long time, visit your doctor for regular checks on your progress. If you are diabetic, you may get a false positive result for sugar in your urine with certain brands of urine tests. Check with your doctor. What side effects may I notice from receiving this medicine? Side effects that you should report to your doctor or health care professional as soon as possible: -allergic reactions like skin rash or hives, swelling of the face, lips, or tongue -chest pain -cough -difficulty breathing -dizziness, drowsiness -fever or infection -joint aches or pains -pale or blue-tinted skin -redness, blistering, peeling or loosening of the skin, including inside the mouth -tingling, burning, pain, or numbness in hands or feet -unusual bleeding or bruising -unusually weak or tired -yellowing of eyes or skin Side effects that usually do not require medical attention (report to your doctor or health care professional if they continue or are bothersome): -dark urine -diarrhea -headache -loss of appetite -nausea or vomiting -temporary hair loss This list may not describe all possible side effects. Call  your doctor for medical advice about side effects. You may report side effects to FDA at 1-800-FDA-1088. Where should I keep my medicine? Keep out of the reach of children. Store at room temperature between 15 and 30 degrees C (59 and 86 degrees F). Protect from light. Throw away any unused medicine after the expiration date. NOTE: This sheet is a summary. It may not cover all  possible information. If you have questions about this medicine, talk to your doctor, pharmacist, or health care provider.  2018 Elsevier/Gold Standard (2007-09-03 15:56:47) Clindamycin vaginal cream What is this medicine? CLINDAMYCIN (Manassa sin) is a lincosamide antibiotic. It is used to treat vaginal infections caused by certain bacteria. This medicine may be used for other purposes; ask your health care provider or pharmacist if you have questions. COMMON BRAND NAME(S): Cleocin, ClindaMax, Clindesse What should I tell my health care provider before I take this medicine? They need to know if you have any of these conditions: -diarrhea -inflammatory bowel disease -kidney or liver disease -stomach problems like colitis -an unusual or allergic reaction to clindamycin, lincomycin, other medicines, foods, dyes or preservatives -pregnant or trying to get pregnant -breast-feeding How should I use this medicine? This medicine is only for use in the vagina. Place in the vagina using the special applicator supplied with the cream. Wash hands before and after use. Fill the applicator with cream. Lie on your back, part and bend your knees. Insert the applicator into the vagina and push the plunger to expel the cream into the vagina. Wash the applicator with warm soapy water and rinse well. Keep this medicine out of the eyes. If you do get any in your eyes rinse out with plenty of cool tap water. Take your medicine at regular intervals. Do not take your medicine more often than directed. Use this medicine for the full course prescribed by your doctor or health care professional, even if you think your condition is better. Do not stop using except on your the advice of your doctor or health care professional. Talk to your pediatrician regarding the use of this medicine in children. Special care may be needed. Overdosage: If you think you have taken too much of this medicine contact a poison control  center or emergency room at once. NOTE: This medicine is only for you. Do not share this medicine with others. What if I miss a dose? If you miss a dose, use it as soon as you can. If it is almost time for your next dose, use only that dose. Do not use double or extra doses. What may interact with this medicine? Interactions are not expected. Do not use any other vaginal products without telling your doctor or health care professional. This list may not describe all possible interactions. Give your health care provider a list of all the medicines, herbs, non-prescription drugs, or dietary supplements you use. Also tell them if you smoke, drink alcohol, or use illegal drugs. Some items may interact with your medicine. What should I watch for while using this medicine? Tell your doctor or health care professional if your symptoms do not start to get better in a few days. Do not use tampons or douches while using this medicine. Do not have sex until you have finished your treatment. Having sex can make the treatment less effective. After you finish treatment, do not use latex condoms for three days. There may still be some medicine in the vagina. This can damage the latex and  make the condom less effective at preventing pregnancy. Your clothing may get soiled. To help prevent reinfection, wear freshly washed cotton, not synthetic, underwear. What side effects may I notice from receiving this medicine? Side effects that you should report to your doctor or health care professional as soon as possible: -allergic reactions like skin rash, itching or hives, swelling of the face, lips, or tongue -diarrhea that is watery or severe -fever or chills, sore throat -increased thirst -itching of the vaginal or genital area -pain during sexual intercourse -stomach pain or cramps -thick white vaginal discharge -unusual bleeding or bruising Side effects that usually do not require medical attention (report to  your doctor or health care professional if they continue or are bothersome): -nausea, vomiting This list may not describe all possible side effects. Call your doctor for medical advice about side effects. You may report side effects to FDA at 1-800-FDA-1088. Where should I keep my medicine? Keep out of the reach of children. Store at room temperature between 20 and 25 degrees C (68 and 77 degrees F). Do not freeze. Throw away any unused medicine after the expiration date. NOTE: This sheet is a summary. It may not cover all possible information. If you have questions about this medicine, talk to your doctor, pharmacist, or health care provider.  2018 Elsevier/Gold Standard (2012-09-18 16:18:30) Bacterial Vaginosis Bacterial vaginosis is a vaginal infection that occurs when the normal balance of bacteria in the vagina is disrupted. It results from an overgrowth of certain bacteria. This is the most common vaginal infection among women ages 75-44. Because bacterial vaginosis increases your risk for STIs (sexually transmitted infections), getting treated can help reduce your risk for chlamydia, gonorrhea, herpes, and HIV (human immunodeficiency virus). Treatment is also important for preventing complications in pregnant women, because this condition can cause an early (premature) delivery. What are the causes? This condition is caused by an increase in harmful bacteria that are normally present in small amounts in the vagina. However, the reason that the condition develops is not fully understood. What increases the risk? The following factors may make you more likely to develop this condition:  Having a new sexual partner or multiple sexual partners.  Having unprotected sex.  Douching.  Having an intrauterine device (IUD).  Smoking.  Drug and alcohol abuse.  Taking certain antibiotic medicines.  Being pregnant.  You cannot get bacterial vaginosis from toilet seats, bedding, swimming  pools, or contact with objects around you. What are the signs or symptoms? Symptoms of this condition include:  Grey or white vaginal discharge. The discharge can also be watery or foamy.  A fish-like odor with discharge, especially after sexual intercourse or during menstruation.  Itching in and around the vagina.  Burning or pain with urination.  Some women with bacterial vaginosis have no signs or symptoms. How is this diagnosed? This condition is diagnosed based on:  Your medical history.  A physical exam of the vagina.  Testing a sample of vaginal fluid under a microscope to look for a large amount of bad bacteria or abnormal cells. Your health care provider may use a cotton swab or a small wooden spatula to collect the sample.  How is this treated? This condition is treated with antibiotics. These may be given as a pill, a vaginal cream, or a medicine that is put into the vagina (suppository). If the condition comes back after treatment, a second round of antibiotics may be needed. Follow these instructions at home: Medicines  Take  over-the-counter and prescription medicines only as told by your health care provider.  Take or use your antibiotic as told by your health care provider. Do not stop taking or using the antibiotic even if you start to feel better. General instructions  If you have a female sexual partner, tell her that you have a vaginal infection. She should see her health care provider and be treated if she has symptoms. If you have a female sexual partner, he does not need treatment.  During treatment: ? Avoid sexual activity until you finish treatment. ? Do not douche. ? Avoid alcohol as directed by your health care provider. ? Avoid breastfeeding as directed by your health care provider.  Drink enough water and fluids to keep your urine clear or pale yellow.  Keep the area around your vagina and rectum clean. ? Wash the area daily with warm  water. ? Wipe yourself from front to back after using the toilet.  Keep all follow-up visits as told by your health care provider. This is important. How is this prevented?  Do not douche.  Wash the outside of your vagina with warm water only.  Use protection when having sex. This includes latex condoms and dental dams.  Limit how many sexual partners you have. To help prevent bacterial vaginosis, it is best to have sex with just one partner (monogamous).  Make sure you and your sexual partner are tested for STIs.  Wear cotton or cotton-lined underwear.  Avoid wearing tight pants and pantyhose, especially during summer.  Limit the amount of alcohol that you drink.  Do not use any products that contain nicotine or tobacco, such as cigarettes and e-cigarettes. If you need help quitting, ask your health care provider.  Do not use illegal drugs. Where to find more information:  Centers for Disease Control and Prevention: AppraiserFraud.fi  American Sexual Health Association (ASHA): www.ashastd.org  [place] Department of Health and Financial controller, Office on Women's Health: DustingSprays.pl or SecuritiesCard.it Contact a health care provider if:  Your symptoms do not improve, even after treatment.  You have more discharge or pain when urinating.  You have a fever.  You have pain in your abdomen.  You have pain during sex.  You have vaginal bleeding between periods. Summary  Bacterial vaginosis is a vaginal infection that occurs when the normal balance of bacteria in the vagina is disrupted.  Because bacterial vaginosis increases your risk for STIs (sexually transmitted infections), getting treated can help reduce your risk for chlamydia, gonorrhea, herpes, and HIV (human immunodeficiency virus). Treatment is also important for preventing complications in pregnant women, because the condition can cause an early (premature)  delivery.  This condition is treated with antibiotic medicines. These may be given as a pill, a vaginal cream, or a medicine that is put into the vagina (suppository). This information is not intended to replace advice given to you by your health care provider. Make sure you discuss any questions you have with your health care provider. Document Released: 02/12/2005 Document Revised: 10/29/2015 Document Reviewed: 10/29/2015 Elsevier Interactive Patient Education  2017 Else[employer] Inc. Urinary Tract Infection, Adult A urinary tract infection (UTI) is an infection of any part of the urinary tract. The urinary tract includes the:  Kidneys.  Ureters.  Bladder.  Urethra.  These organs make, store, and get rid of pee (urine) in the body. Follow these instructions at home:  Take over-the-counter and prescription medicines only as told by your doctor.  If you were prescribed an antibiotic  medicine, take it as told by your doctor. Do not stop taking the antibiotic even if you start to feel better.  Avoid the following drinks: ? Alcohol. ? Caffeine. ? Tea. ? Carbonated drinks.  Drink enough fluid to keep your pee clear or pale yellow.  Keep all follow-up visits as told by your doctor. This is important.  Make sure to: ? Empty your bladder often and completely. Do not to hold pee for long periods of time. ? Empty your bladder before and after sex. ? Wipe from front to back after a bowel movement if you are female. Use each tissue one time when you wipe. Contact a doctor if:  You have back pain.  You have a fever.  You feel sick to your stomach (nauseous).  You throw up (vomit).  Your symptoms do not get better after 3 days.  Your symptoms go away and then come back. Get help right away if:  You have very bad back pain.  You have very bad lower belly (abdominal) pain.  You are throwing up and cannot keep down any medicines or water. This information is not intended to  replace advice given to you by your health care provider. Make sure you discuss any questions you have with your health care provider. Document Released: 08/01/2007 Document Revised: 07/21/2015 Document Reviewed: 01/03/2015 Elsevier Interactive Patient Education  [employer].

## 2016-08-20 NOTE — Progress Notes (Signed)
   Patient is a 41 year old that presented to the office today complaining over the past several weeks low suprapubic pressure and now for the past 3-4 days complaint dysuria and frequency and low back discomfort. She denied any fever, chills, nausea or vomiting. She has had history in the past of interstitial cystitis where she has not seen the urologist in quite some time since her last cystoscopy. She is on the oral contraceptive pill for contraception. She was also complaining of a slight vaginal discharge with no odor and no pruritus.  Exam: Back: No CVA tenderness Abdomen: Soft nontender no rebound or guarding, suprapubic tenderness Pelvic: Bartholin urethra Skene was within normal limits Vagina: Clear discharge no odor Cervix: No lesions or discharge Bimanual exam uterus anteverted normal size shape and consistency. Her suprapubic Adnexa: No palpable masses or tenderness Rectal exam: Not done  Wet prep moderate clue cells, too numerous to count white blood cells, too numerous to count bacteria  Urinalysis many bacteria clue cells were present 10-20 WBC, 0-2 RBC  Assessment/plan: Patient with clinical evidence of bacterial vaginosis and possibly underlying cystitis based on urinalysis. She will be started on Cleocin vaginal cream to apply daily at bedtime for one week. This will help with the bacterial vaginosis component. For urinary tract infection she'll be started on Macrobid one by mouth twice a day for 7 days and Pyridium 200 mg 3 times a day for 3 days for her bladder spasm.

## 2016-08-22 LAB — URINE CULTURE

## 2016-12-07 ENCOUNTER — Other Ambulatory Visit: Payer: Self-pay

## 2016-12-07 MED ORDER — NORETHIN ACE-ETH ESTRAD-FE 1-20 MG-MCG PO TABS: ORAL_TABLET | ORAL | 0 refills | Status: DC

## 2017-02-26 HISTORY — PX: BREAST BIOPSY: SHX20

## 2017-05-28 ENCOUNTER — Ambulatory Visit: Payer: PRIVATE HEALTH INSURANCE | Admitting: Family Medicine

## 2017-05-28 ENCOUNTER — Encounter: Payer: Self-pay | Admitting: Family Medicine

## 2017-05-28 VITALS — BP 126/84 | HR 70 | Temp 97.2°F | Ht 64.5 in | Wt 141.0 lb

## 2017-05-28 DIAGNOSIS — R0981 Nasal congestion: Secondary | ICD-10-CM | POA: Diagnosis not present

## 2017-05-28 MED ORDER — PREDNISONE 20 MG PO TABS: ORAL_TABLET | ORAL | 0 refills | Status: DC

## 2017-05-28 NOTE — Progress Notes (Signed)
BP 126/84   Pulse 70   Temp (!) 97.2 F (36.2 C) (Oral)   Ht 5' 4.5" (1.638 m)   Wt 141 lb (64 kg)   BMI 23.83 kg/m    Subjective:    Patient ID: Terri Hoover, female    DOB: [birth year], 42 y.o.   MRN: [id number]  HPI: Terri Hoover is a 42 y.o. female presenting on 05/28/2017 for Sinus congestion, bilateral ear pain, pain in right side of  (spoke with "tele-doc" last week and started Augmentin 875 bid last Thursday, still no better; has been taking several OTC meds inluding allergy meds and Flonase)   HPI Ear pain and sinus congestion Patient comes in complaining of ear pain and sinus congestion and pressure.  She says is been going on for 3-1/2 weeks and she started to develop right ear pain more recently over the past week and sinus pressure on the right side and sore throat on the right side.  She denies any fevers or chills.  She did call a tele-doctor 5 days ago and got a prescription for Augmentin and has been taking it for 4 days now.  She does not feel like it is improving.  She is taking Xyzal and Flonase and Mucinex DM along with it which is not seeming to kick this.  She denies any shortness of breath or wheezing.  She feels like it may have even gotten worse over the past few days and she is started develop a cough overnight.  Relevant past medical, surgical, family and social history reviewed and updated as indicated. Interim medical history since our last visit reviewed. Allergies and medications reviewed and updated.  Review of Systems  Constitutional: Negative for chills and fever.  HENT: Positive for congestion, postnasal drip, rhinorrhea, sinus pressure, sneezing and sore throat. Negative for ear discharge and ear pain.   Eyes: Negative for pain, redness and visual disturbance.  Respiratory: Positive for cough. Negative for chest tightness and shortness of breath.   Cardiovascular: Negative for chest pain and leg swelling.  Genitourinary: Negative for difficulty  urinating and dysuria.  Musculoskeletal: Negative for back pain and gait problem.  Skin: Negative for rash.  Neurological: Negative for light-headedness and headaches.  Psychiatric/Behavioral: Negative for agitation and behavioral problems.  All other systems reviewed and are negative.   Per HPI unless specifically indicated above   Allergies as of 05/28/2017   No Known Allergies     Medication List        Accurate as of 05/28/17 11:02 AM. Always use your most recent med list.          AUGMENTIN 875-125 MG tablet Generic drug:  amoxicillin-clavulanate Take 1 tablet by mouth 2 (two) times daily.   FLUoxetine 20 MG tablet Commonly known as:  PROZAC 1 1/2 tab po qd   levothyroxine 50 MCG tablet Commonly known as:  SYNTHROID, LEVOTHROID Take 1 tablet by mouth daily.   multivitamin tablet Take 1 tablet by mouth daily.   norethindrone-ethinyl estradiol 1-20 MG-MCG tablet Commonly known as:  MICROGESTIN FE 1/20 TAKE ONE TABLET BY MOUTH ONCE DAILY IN THE EVENING   predniSONE 20 MG tablet Commonly known as:  DELTASONE Take 3 tabs daily for 1 week, then 2 tabs daily for week 2, then 1 tab daily for week 3.          Objective:    BP 126/84   Pulse 70   Temp (!) 97.2 F (36.2 C) (Oral)  Ht 5' 4.5" (1.638 m)   Wt 141 lb (64 kg)   BMI 23.83 kg/m   Wt Readings from Last 3 Encounters:  05/28/17 141 lb (64 kg)  01/02/16 141 lb (64 kg)  06/09/15 137 lb (62.1 kg)    Physical Exam  Constitutional: She is oriented to person, place, and time. She appears well-developed and well-nourished. No distress.  HENT:  Right Ear: Tympanic membrane, external ear and ear canal normal.  Left Ear: Tympanic membrane, external ear and ear canal normal.  Nose: Mucosal edema and rhinorrhea present. No epistaxis. Right sinus exhibits maxillary sinus tenderness. Right sinus exhibits no frontal sinus tenderness. Left sinus exhibits maxillary sinus tenderness. Left sinus exhibits no frontal  sinus tenderness.  Mouth/Throat: Uvula is midline and mucous membranes are normal. Posterior oropharyngeal edema present. No oropharyngeal exudate, posterior oropharyngeal erythema or tonsillar abscesses.  Eyes: Conjunctivae are normal.  Cardiovascular: Normal rate, regular rhythm, normal heart sounds and intact distal pulses.  No murmur heard. Pulmonary/Chest: Effort normal and breath sounds normal. No respiratory distress. She has no wheezes. She has no rales.  Neurological: She is alert and oriented to person, place, and time. Coordination normal.  Skin: Skin is warm and dry. No rash noted. She is not diaphoretic.  Psychiatric: She has a normal mood and affect. Her behavior is normal.  Vitals reviewed.       Assessment & Plan:   Problem List Items Addressed This Visit    None    Visit Diagnoses    Sinus congestion    -  Primary   Already taking an antibiotic, will add prednisone, already taking Xyzal and Flonase and Mucinex   Relevant Medications   predniSONE (DELTASONE) 20 MG tablet       Follow up plan: Return if symptoms worsen or fail to improve.  Counseling provided for all of the vaccine components No orders of the defined types were placed in this encounter.   Caryl Pina, MD [facility] 05/28/2017, 11:02 AM

## 2017-06-03 ENCOUNTER — Other Ambulatory Visit: Payer: Self-pay | Admitting: Gynecology

## 2017-06-03 DIAGNOSIS — Z1231 Encounter for screening mammogram for malignant neoplasm of breast: Secondary | ICD-10-CM

## 2017-06-06 ENCOUNTER — Other Ambulatory Visit: Payer: Self-pay | Admitting: Gynecology

## 2017-06-06 ENCOUNTER — Ambulatory Visit
Admission: RE | Admit: 2017-06-06 | Discharge: 2017-06-06 | Disposition: A | Discharge status: Home / Self Care | Payer: PRIVATE HEALTH INSURANCE | Source: Ambulatory Visit | Attending: Gynecology | Admitting: Gynecology

## 2017-06-06 DIAGNOSIS — R928 Other abnormal and inconclusive findings on diagnostic imaging of breast: Secondary | ICD-10-CM

## 2017-06-06 DIAGNOSIS — Z1231 Encounter for screening mammogram for malignant neoplasm of breast: Secondary | ICD-10-CM

## 2017-06-06 DIAGNOSIS — N6320 Unspecified lump in the left breast, unspecified quadrant: Secondary | ICD-10-CM

## 2017-06-07 ENCOUNTER — Telehealth: Payer: Self-pay | Admitting: *Deleted

## 2017-06-07 NOTE — Telephone Encounter (Signed)
Pt called had mammogram yesterday, pt said Dr.Fernandez would order breast MRI yearly due to family history of breast cancer. She has annual with you scheduled on 07/31/17. Patient asked do wish to do same? Prefer to wait until annual to discuss? Please advise

## 2017-06-10 NOTE — Telephone Encounter (Signed)
Whenever I am doing MRI studies and mammograms for increased risk of breast cancer I usually alternate them every 6 months so that it gives [place] a look at the breasts every 78-months.  If she is okay with this then we can talk about it when I see her in June.

## 2017-06-10 NOTE — Telephone Encounter (Signed)
Left message for pt to call.

## 2017-06-12 ENCOUNTER — Encounter: Payer: Self-pay | Admitting: *Deleted

## 2017-06-12 NOTE — Telephone Encounter (Signed)
Sent mychart message

## 2017-06-19 ENCOUNTER — Telehealth: Payer: Self-pay | Admitting: *Deleted

## 2017-06-19 MED ORDER — NORETHIN ACE-ETH ESTRAD-FE 1-20 MG-MCG PO TABS: ORAL_TABLET | ORAL | 0 refills | Status: DC

## 2017-06-19 NOTE — Telephone Encounter (Signed)
Patient called requesting refill on Junel 1/20 mg tablet, annual scheduled on 07/31/17, Rx sent. I read my chart message as well from 06/12/17.

## 2017-07-31 ENCOUNTER — Encounter: Payer: PRIVATE HEALTH INSURANCE | Admitting: Gynecology

## 2017-08-27 ENCOUNTER — Other Ambulatory Visit: Payer: Self-pay | Admitting: Gynecology

## 2017-08-27 MED ORDER — NORETHIN ACE-ETH ESTRAD-FE 1-20 MG-MCG PO TABS: ORAL_TABLET | ORAL | 0 refills | Status: DC

## 2017-08-27 NOTE — Addendum Note (Signed)
Addended by: Ramond Craver on: 08/27/2017 10:07 AM   Modules accepted: Orders

## 2017-08-27 NOTE — Telephone Encounter (Signed)
Dr. Phineas Real sent staff message that he only wanted to refill her bcp Rx for one pack to take her until her CE at end of mos. SHe must keep that appointment for addt'l refills. I spoke with pharmacist and corrected the refill for 3 mos that he sent in error. She changed it to one mos only.

## 2017-08-27 NOTE — Telephone Encounter (Signed)
Overdue for CE. Last CE 01/02/2016. Anderson Malta refilled x 3 mos in April when she scheduled appointment for June.  She rescheduled the June appointment and is now scheduled for 09/23/17 and requesting 3 mos supply again.

## 2017-09-18 ENCOUNTER — Other Ambulatory Visit: Payer: Self-pay | Admitting: Gynecology

## 2017-09-23 ENCOUNTER — Ambulatory Visit (INDEPENDENT_AMBULATORY_CARE_PROVIDER_SITE_OTHER): Payer: PRIVATE HEALTH INSURANCE | Admitting: Gynecology

## 2017-09-23 ENCOUNTER — Encounter: Payer: Self-pay | Admitting: Gynecology

## 2017-09-23 VITALS — BP 130/80 | Ht 64.0 in | Wt 147.0 lb

## 2017-09-23 DIAGNOSIS — Z01419 Encounter for gynecological examination (general) (routine) without abnormal findings: Secondary | ICD-10-CM

## 2017-09-23 DIAGNOSIS — Z13220 Encounter for screening for lipoid disorders: Secondary | ICD-10-CM | POA: Diagnosis not present

## 2017-09-23 MED ORDER — NORETHIN ACE-ETH ESTRAD-FE 1-20 MG-MCG PO TABS: ORAL_TABLET | ORAL | 4 refills | Status: DC

## 2017-09-23 NOTE — Progress Notes (Signed)
    TAIESHA BOVARD Sep 21, 1975 [id number]        42 y.o.  [id number] for annual gynecologic exam.  Former patient of Dr. Toney Rakes.  Doing well without gynecologic complaints.  Several issues noted below.  Past medical history,surgical history, problem list, medications, allergies, family history and social history were all reviewed and documented as reviewed in the EPIC chart.  ROS:  Performed with pertinent positives and negatives included in the history, assessment and plan.   Additional significant findings : None   Exam: Caryn Bee assistant Vitals:   09/23/17 1357  BP: 130/80  Weight: 147 lb (66.7 kg)  Height: 5\' 4"  (1.626 m)   Body mass index is 25.23 kg/m.  General appearance:  Normal affect, orientation and appearance. Skin: Grossly normal HEENT: Without gross lesions.  No cervical or supraclavicular adenopathy. Thyroid normal.  Lungs:  Clear without wheezing, rales or rhonchi Cardiac: RR, without RMG Abdominal:  Soft, nontender, without masses, guarding, rebound, organomegaly or hernia Breasts:  Examined lying and sitting without masses, retractions, discharge or axillary adenopathy. Pelvic:  Ext, BUS, Vagina: Normal  Cervix: Normal  Uterus: Retroverted, normal size, shape and contour, midline and mobile nontender   Adnexa: Without masses or tenderness    Anus and perineum: Normal   Rectovaginal: Normal sphincter tone without palpated masses or tenderness.    Assessment/Plan:  42 y.o. G59P2002 female for annual gynecologic exam without menses, continuous oral contraceptives.   1. Continuous oral contraceptives.  Patient using Loestrin 1/20 equivalent continuously for endometriosis suppression and doing well with this.  Patient wants to continue.  Reviewed risks to include thrombosis.  Patient understands accepts and refill x1 year provided. 2. Family history of breast cancer.  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  Maternal  grandmother had breast cancer as well as maternal aunt who was in her 73s.  Discussed possible genetic linkage.  Patient agrees to appointment with genetic counselor to assess for genetic screening.  Will arrange and she will follow-up for this.  Otherwise she will continue with her annual mammography and MRIs.  Breast exam normal today. 3. Pap smear/HPV 06/2014.  No Pap smear done today.  History of LGSIL 2001 by Dr. Sandrea Hughs note.  Plan repeat Pap smear/HPV at 5-year interval per current screening guidelines. 4. Health maintenance.  Requests baseline labs.  Will return for fasting CBC, CMP and lipid profile.  Being followed for thyroid dysfunction elsewhere.  Follow-up in 1 year for annual exam, sooner as needed.   Anastasio Auerbach MD, 2:23 PM 09/23/2017

## 2017-09-23 NOTE — Patient Instructions (Signed)
Follow-up for fasting blood work as arranged  Dietitian will arrange appointment to discuss your breast cancer risk and offer genetic testing if appropriate  Follow-up in 1 year for annual exam

## 2017-09-27 ENCOUNTER — Telehealth: Payer: Self-pay | Admitting: *Deleted

## 2017-09-27 DIAGNOSIS — Z803 Family history of malignant neoplasm of breast: Secondary | ICD-10-CM

## 2017-09-27 NOTE — Telephone Encounter (Signed)
1.Mri order placed at [place] told patient she can call to schedule.   2. Referral placed at [facility] cancer they will patient to schedule.

## 2017-09-27 NOTE — Telephone Encounter (Signed)
-----   Message from Anastasio Auerbach, MD sent at 09/23/2017  2:23 PM EDT ----- 1.  Needs order for breast MRI due to dense breasts and family history of breast cancer.  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  Appointment with oncology genetic counselor reference family history of breast cancer assess for genetic screening

## 2017-10-01 ENCOUNTER — Other Ambulatory Visit: Payer: PRIVATE HEALTH INSURANCE

## 2017-10-10 NOTE — Telephone Encounter (Signed)
1. scheduled on 10/21/17 @ 7:10pm

## 2017-10-14 ENCOUNTER — Telehealth: Payer: Self-pay

## 2017-10-14 NOTE — Telephone Encounter (Signed)
Spoke with rep at [facility] and received Auth # [id number] valid 8/26--01/19/18.  Added to appointment notes.

## 2017-10-17 ENCOUNTER — Encounter: Payer: Self-pay | Admitting: Gynecology

## 2017-10-21 ENCOUNTER — Telehealth: Payer: Self-pay

## 2017-10-21 ENCOUNTER — Ambulatory Visit
Admission: RE | Admit: 2017-10-21 | Discharge: 2017-10-21 | Disposition: A | Discharge status: Home / Self Care | Payer: PRIVATE HEALTH INSURANCE | Source: Ambulatory Visit | Attending: Gynecology | Admitting: Gynecology

## 2017-10-21 ENCOUNTER — Other Ambulatory Visit: Payer: Self-pay | Admitting: Gynecology

## 2017-10-21 DIAGNOSIS — R9389 Abnormal findings on diagnostic imaging of other specified body structures: Secondary | ICD-10-CM

## 2017-10-21 DIAGNOSIS — Z803 Family history of malignant neoplasm of breast: Secondary | ICD-10-CM

## 2017-10-21 MED ORDER — GADOBENATE DIMEGLUMINE 529 MG/ML IV SOLN
13.0000 mL | Freq: Once | INTRAVENOUS | Status: AC | PRN
  Administered 2017-10-21: 13 mL via INTRAVENOUS

## 2017-10-21 NOTE — Telephone Encounter (Signed)
I have never been asked to call the patient about an abnormal breast study with recommendation for biopsy before.  Normally the breast center calls the patient's directly with the results and schedules the biopsy.  If this is the way they want me to handle it then I will call the patient and then the breast center will have to call her to schedule the biopsy and explained the findings if she asks technical questions.  Let me know how they want me to handle this.

## 2017-10-21 NOTE — Telephone Encounter (Signed)
Juliann Pulse, from [facility], called to let me know that patient had MRI today and was recommended to have MRI guided biopsy.  She said that because Dr. Loetta Rough ordered he will need to be the one to notify patient of the need for biopsy.  Report is in the chart.

## 2017-10-22 NOTE — Telephone Encounter (Signed)
I spoke with Juliann Pulse at the [facility]. This is their protocol with screening MRI (treating this like CT scan results- Dr. Always contacts patients with CT result.).  The ordering physician to notify patient of findings.  In this case the patient already has been informed by Dr. 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 She is scheduled for Biopsy tomorrow. Orders have been sent and you just need to co-sign.

## 2017-10-22 NOTE — Telephone Encounter (Signed)
Left message for Terri Hoover. At [facility].

## 2017-10-23 ENCOUNTER — Ambulatory Visit
Admission: RE | Admit: 2017-10-23 | Discharge: 2017-10-23 | Disposition: A | Discharge status: Home / Self Care | Payer: PRIVATE HEALTH INSURANCE | Source: Ambulatory Visit | Attending: Gynecology | Admitting: Gynecology

## 2017-10-23 DIAGNOSIS — R9389 Abnormal findings on diagnostic imaging of other specified body structures: Secondary | ICD-10-CM

## 2017-10-23 MED ORDER — GADOBENATE DIMEGLUMINE 529 MG/ML IV SOLN
13.0000 mL | Freq: Once | INTRAVENOUS | Status: AC | PRN
  Administered 2017-10-23: 13 mL via INTRAVENOUS

## 2017-11-18 ENCOUNTER — Ambulatory Visit: Payer: PRIVATE HEALTH INSURANCE | Admitting: Gynecology

## 2017-11-18 DIAGNOSIS — Z0289 Encounter for other administrative examinations: Secondary | ICD-10-CM

## 2018-02-04 ENCOUNTER — Ambulatory Visit: Payer: PRIVATE HEALTH INSURANCE | Admitting: Gynecology

## 2018-02-12 ENCOUNTER — Encounter: Payer: Self-pay | Admitting: Women's Health

## 2018-02-12 ENCOUNTER — Ambulatory Visit: Payer: PRIVATE HEALTH INSURANCE | Admitting: Women's Health

## 2018-02-12 VITALS — BP 116/72

## 2018-02-12 DIAGNOSIS — N898 Other specified noninflammatory disorders of vagina: Secondary | ICD-10-CM

## 2018-02-12 DIAGNOSIS — R350 Frequency of micturition: Secondary | ICD-10-CM | POA: Diagnosis not present

## 2018-02-12 LAB — WET PREP FOR TRICH, YEAST, CLUE

## 2018-02-12 MED ORDER — METRONIDAZOLE 0.75 % VA GEL: VAGINAL | 1 refills | Status: DC

## 2018-02-12 NOTE — Progress Notes (Signed)
42 y.o. MWF G2P2 presents with vaginal discharge with itching, odor, lower abdominal cramping and frequent urination for 3 months. Negative for CVAT, back pain, fever, nausea, vomiting, or urinary burning. Goes to th gym, wears spandex 4 times a week, wears thongs multiple times a week. Has been drinking moderate amount of water, has used OTC yeast infection suppositories and boric acid suppositories, bought over the internet, symptoms resolve for a few days but recur. Light bleeding after intercourse. Married, same partner for years. History of interstitial cystitis.  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  Exam: Appears well, external genitalia within normal limits, speculum exam  Moderate white adherent discharge with odor noted, cervix erytematous with ectopy. vaginal walls with mild erythema.  Minimal tenderness with exam.  Cervix friable. Wet Prep: Positive Clue cells, Bacteria: TNTC, Urinalysis: Trace blood, negative nitrates, +3 leukocytes, 10-20 WBC, 3-10 RBC, many bacteria, moderate clue cells.  Bacterial Vaginosis Possible UTI  Plan:  Urine Culture pending. MetroGel vaginal cream applicator at bedtime x 5 and then weekly for several weeks. Alcohol avoid guidelines reviewed.  Instructed to call if no relief.  After symptoms resolve completely over-the-counter boric acid gelcaps twice weekly per vagina.  Reviewed need to avoid spicy foods, bladder irritant foods/drinks and empty bladder on a regular basis.

## 2018-02-12 NOTE — Patient Instructions (Signed)
Bacterial Vaginosis  Bacterial vaginosis is a vaginal infection that occurs when the normal balance of bacteria in the vagina is disrupted. It results from an overgrowth of certain bacteria. This is the most common vaginal infection among women ages 55-44. Because bacterial vaginosis increases your risk for STIs (sexually transmitted infections), getting treated can help reduce your risk for chlamydia, gonorrhea, herpes, and HIV (human immunodeficiency virus). Treatment is also important for preventing complications in pregnant women, because this condition can cause an early (premature) delivery. What are the causes? This condition is caused by an increase in harmful bacteria that are normally present in small amounts in the vagina. However, the reason that the condition develops is not fully understood. What increases the risk? The following factors may make you more likely to develop this condition:  Having a new sexual partner or multiple sexual partners.  Having unprotected sex.  Douching.  Having an intrauterine device (IUD).  Smoking.  Drug and alcohol abuse.  Taking certain antibiotic medicines.  Being pregnant. You cannot get bacterial vaginosis from toilet seats, bedding, swimming pools, or contact with objects around you. What are the signs or symptoms? Symptoms of this condition include:  Grey or white vaginal discharge. The discharge can also be watery or foamy.  A fish-like odor with discharge, especially after sexual intercourse or during menstruation.  Itching in and around the vagina.  Burning or pain with urination. Some women with bacterial vaginosis have no signs or symptoms. How is this diagnosed? This condition is diagnosed based on:  Your medical history.  A physical exam of the vagina.  Testing a sample of vaginal fluid under a microscope to look for a large amount of bad bacteria or abnormal cells. Your health care provider may use a cotton swab or  a small wooden spatula to collect the sample. How is this treated? This condition is treated with antibiotics. These may be given as a pill, a vaginal cream, or a medicine that is put into the vagina (suppository). If the condition comes back after treatment, a second round of antibiotics may be needed. Follow these instructions at home: Medicines  Take over-the-counter and prescription medicines only as told by your health care provider.  Take or use your antibiotic as told by your health care provider. Do not stop taking or using the antibiotic even if you start to feel better. General instructions  If you have a female sexual partner, tell her that you have a vaginal infection. She should see her health care provider and be treated if she has symptoms. If you have a female sexual partner, he does not need treatment.  During treatment: ? Avoid sexual activity until you finish treatment. ? Do not douche. ? Avoid alcohol as directed by your health care provider. ? Avoid breastfeeding as directed by your health care provider.  Drink enough water and fluids to keep your urine clear or pale yellow.  Keep the area around your vagina and rectum clean. ? Wash the area daily with warm water. ? Wipe yourself from front to back after using the toilet.  Keep all follow-up visits as told by your health care provider. This is important. How is this prevented?  Do not douche.  Wash the outside of your vagina with warm water only.  Use protection when having sex. This includes latex condoms and dental dams.  Limit how many sexual partners you have. To help prevent bacterial vaginosis, it is best to have sex with just one partner (  douche.  · Wash the outside of your vagina with warm water only.  · Use protection when having sex. This includes latex condoms and dental dams.  · Limit how many sexual partners you have. To help prevent bacterial vaginosis, it is best to have sex with just one partner (monogamous).  · Make sure you and your sexual partner are tested for STIs.  · Wear cotton or cotton-lined underwear.  · Avoid wearing tight pants and pantyhose, especially during summer.  · Limit the amount of alcohol that you drink.  · Do not use any products that contain  nicotine or tobacco, such as cigarettes and e-cigarettes. If you need help quitting, ask your health care provider.  · Do not use illegal drugs.  Where to find more information  · Centers for Disease Control and Prevention: www.cdc.gov/std  · American Sexual Health Association (ASHA): www.ashastd.org  · U.S. Department of Health and Human Services, Office on Women's Health: www.womenshealth.gov/ or https://www.womenshealth.gov/a-z-topics/bacterial-vaginosis  Contact a health care provider if:  · Your symptoms do not improve, even after treatment.  · You have more discharge or pain when urinating.  · You have a fever.  · You have pain in your abdomen.  · You have pain during sex.  · You have vaginal bleeding between periods.  Summary  · Bacterial vaginosis is a vaginal infection that occurs when the normal balance of bacteria in the vagina is disrupted.  · Because bacterial vaginosis increases your risk for STIs (sexually transmitted infections), getting treated can help reduce your risk for chlamydia, gonorrhea, herpes, and HIV (human immunodeficiency virus). Treatment is also important for preventing complications in pregnant women, because the condition can cause an early (premature) delivery.  · This condition is treated with antibiotic medicines. These may be given as a pill, a vaginal cream, or a medicine that is put into the vagina (suppository).  This information is not intended to replace advice given to you by your health care provider. Make sure you discuss any questions you have with your health care provider.  Document Released: 02/12/2005 Document Revised: 06/18/2016 Document Reviewed: 10/29/2015  Elsevier Interactive Patient Education © 2019 Elsevier Inc.

## 2018-02-14 ENCOUNTER — Ambulatory Visit: Payer: PRIVATE HEALTH INSURANCE | Admitting: Gynecology

## 2018-02-14 ENCOUNTER — Encounter: Payer: Self-pay | Admitting: *Deleted

## 2018-02-14 LAB — URINALYSIS, COMPLETE W/RFL CULTURE
Bilirubin Urine: NEGATIVE
Glucose, UA: NEGATIVE
Hyaline Cast: NONE SEEN /LPF
Ketones, ur: NEGATIVE
NITRITES URINE, INITIAL: NEGATIVE
PH: 7 (ref 5.0–8.0)
Protein, ur: NEGATIVE
Specific Gravity, Urine: 1.015 (ref 1.001–1.03)

## 2018-02-14 LAB — URINE CULTURE
MICRO NUMBER:: 91520188
Result:: NO GROWTH
SPECIMEN QUALITY:: ADEQUATE

## 2018-02-14 LAB — CULTURE INDICATED

## 2018-09-18 ENCOUNTER — Other Ambulatory Visit: Payer: Self-pay | Admitting: Gynecology

## 2018-09-27 DIAGNOSIS — R87610 Atypical squamous cells of undetermined significance on cytologic smear of cervix (ASC-US): Secondary | ICD-10-CM

## 2018-09-27 HISTORY — DX: Atypical squamous cells of undetermined significance on cytologic smear of cervix (ASC-US): R87.610

## 2018-10-14 ENCOUNTER — Encounter: Payer: Self-pay | Admitting: Gynecology

## 2018-10-14 ENCOUNTER — Other Ambulatory Visit: Payer: Self-pay

## 2018-10-14 ENCOUNTER — Ambulatory Visit (INDEPENDENT_AMBULATORY_CARE_PROVIDER_SITE_OTHER): Payer: PRIVATE HEALTH INSURANCE | Admitting: Gynecology

## 2018-10-14 VITALS — BP 118/76 | Ht 65.0 in | Wt 146.0 lb

## 2018-10-14 DIAGNOSIS — Z13220 Encounter for screening for lipoid disorders: Secondary | ICD-10-CM

## 2018-10-14 DIAGNOSIS — Z01419 Encounter for gynecological examination (general) (routine) without abnormal findings: Secondary | ICD-10-CM | POA: Diagnosis not present

## 2018-10-14 DIAGNOSIS — Z1151 Encounter for screening for human papillomavirus (HPV): Secondary | ICD-10-CM

## 2018-10-14 DIAGNOSIS — Z803 Family history of malignant neoplasm of breast: Secondary | ICD-10-CM | POA: Diagnosis not present

## 2018-10-14 LAB — LIPID PANEL
Cholesterol: 245 mg/dL — ABNORMAL HIGH (ref <200)
HDL: 50 mg/dL (ref >=50)
LDL Cholesterol (Calc): 173 mg/dL (calc) — ABNORMAL HIGH
Non-HDL Cholesterol (Calc): 195 mg/dL (calc) — ABNORMAL HIGH (ref <130)
Total CHOL/HDL Ratio: 4.9 (calc) (ref <5.0)
Triglycerides: 100 mg/dL (ref <150)

## 2018-10-14 LAB — COMPREHENSIVE METABOLIC PANEL
AG Ratio: 1.5 (calc) (ref 1.0–2.5)
ALT: 15 U/L (ref 6–29)
AST: 17 U/L (ref 10–30)
Albumin: 4 g/dL (ref 3.6–5.1)
Alkaline phosphatase (APISO): 45 U/L (ref 31–125)
BUN: 18 mg/dL (ref 7–25)
CO2: 24 mmol/L (ref 20–32)
Calcium: 9.4 mg/dL (ref 8.6–10.2)
Chloride: 104 mmol/L (ref 98–110)
Creat: 0.81 mg/dL (ref 0.50–1.10)
Globulin: 2.6 g/dL (calc) (ref 1.9–3.7)
Glucose, Bld: 87 mg/dL (ref 65–99)
Potassium: 4.7 mmol/L (ref 3.5–5.3)
Sodium: 137 mmol/L (ref 135–146)
Total Bilirubin: 0.5 mg/dL (ref 0.2–1.2)
Total Protein: 6.6 g/dL (ref 6.1–8.1)

## 2018-10-14 LAB — CBC WITH DIFFERENTIAL/PLATELET
Absolute Monocytes: 401 cells/uL (ref 200–950)
Basophils Absolute: 61 cells/uL (ref 0–200)
Basophils Relative: 0.9 %
Eosinophils Absolute: 150 cells/uL (ref 15–500)
Eosinophils Relative: 2.2 %
HCT: 42.4 % (ref 35.0–45.0)
Hemoglobin: 14.3 g/dL (ref 11.7–15.5)
Lymphs Abs: 1319 cells/uL (ref 850–3900)
MCH: 30.1 pg (ref 27.0–33.0)
MCHC: 33.7 g/dL (ref 32.0–36.0)
MCV: 89.3 fL (ref 80.0–100.0)
MPV: 11.8 fL (ref 7.5–12.5)
Monocytes Relative: 5.9 %
Neutro Abs: 4869 cells/uL (ref 1500–7800)
Neutrophils Relative %: 71.6 %
Platelets: 229 10*3/uL (ref 140–400)
RBC: 4.75 10*6/uL (ref 3.80–5.10)
RDW: 11.8 % (ref 11.0–15.0)
Total Lymphocyte: 19.4 %
WBC: 6.8 10*3/uL (ref 3.8–10.8)

## 2018-10-14 MED ORDER — NORETHIN ACE-ETH ESTRAD-FE 1-20 MG-MCG PO TABS: ORAL_TABLET | ORAL | 4 refills | Status: DC

## 2018-10-14 NOTE — Addendum Note (Signed)
Addended by: Nelva Nay on: 10/14/2018 10:12 AM   Modules accepted: Orders

## 2018-10-14 NOTE — Patient Instructions (Signed)
Schedule your mammogram and MRI.  Call if you want an appointment with the genetic counselor in reference to genetic testing due to breast cancer history  Follow-up in 1 year for annual exam

## 2018-10-14 NOTE — Progress Notes (Signed)
    Terri Hoover 01-Nov-1975 [id number]        43 y.o.  [id number] for annual gynecologic exam.  Without gynecologic complaints.  Past medical history,surgical history, problem list, medications, allergies, family history and social history were all reviewed and documented as reviewed in the EPIC chart.  ROS:  Performed with pertinent positives and negatives included in the history, assessment and plan.   Additional significant findings : None   Exam: Caryn Bee assistant Vitals:   10/14/18 0936  BP: 118/76  Weight: 146 lb (66.2 kg)  Height: 5\' 5"  (1.651 m)   Body mass index is 24.3 kg/m.  General appearance:  Normal affect, orientation and appearance. Skin: Grossly normal HEENT: Without gross lesions.  No cervical or supraclavicular adenopathy. Thyroid normal.  Lungs:  Clear without wheezing, rales or rhonchi Cardiac: RR, without RMG Abdominal:  Soft, nontender, without masses, guarding, rebound, organomegaly or hernia Breasts:  Examined lying and sitting without masses, retractions, discharge or axillary adenopathy. Pelvic:  Ext, BUS, Vagina: Normal  Cervix: Normal.  Pap smear/HPV  Uterus: Retroverted, normal size, shape and contour, midline and mobile nontender   Adnexa: Without masses or tenderness    Anus and perineum: Normal   Rectovaginal: Normal sphincter tone without palpated masses or tenderness.    Assessment/Plan:  43 y.o. G55P2002 female for annual gynecologic exam.  Without menses, continuous oral contraceptives  1. Continuous oral contraceptives.  Continues on Loestrin 1/20 equivalent doing well.  Wants to continue.  History of endometriosis.  Reviewed risks to include thrombosis.  Patient comfortable continuing and I refilled her x1 year. 2. Family history of breast cancer.  Discussed genetic possibilities and options for genetic counseling as I have in the past.  Patient has declined in the past and again declines now.  She is doing annual mammography and  annual MRI screening.  Is due for this now and is in the process of arranging.  Had biopsy last year which was benign.  She understands possible genetic linkage increases risk of ovarian cancer and again declines.  Will call me if she changes her mind. 3. Pap smear/HPV 06/2014.  Pap smear/HPV today.  History of LGSIL 2001 per Dr. Toney Rakes note. 4. Health maintenance.  Failed to follow-up for blood work last year.  CBC, CMP and lipid profile ordered today.  Actively followed for thyroid elsewhere.  Follow-up in 1 year, sooner as needed.   Anastasio Auerbach MD, 10:04 AM 10/14/2018

## 2018-10-15 ENCOUNTER — Other Ambulatory Visit: Payer: Self-pay | Admitting: Gynecology

## 2018-10-15 ENCOUNTER — Encounter: Payer: Self-pay | Admitting: Gynecology

## 2018-10-15 DIAGNOSIS — E7800 Pure hypercholesterolemia, unspecified: Secondary | ICD-10-CM

## 2018-10-15 LAB — PAP IG AND HPV HIGH-RISK: HPV DNA High Risk: NOT DETECTED

## 2018-10-16 ENCOUNTER — Encounter: Payer: Self-pay | Admitting: Gynecology

## 2018-10-28 ENCOUNTER — Ambulatory Visit (INDEPENDENT_AMBULATORY_CARE_PROVIDER_SITE_OTHER): Payer: PRIVATE HEALTH INSURANCE

## 2018-10-28 ENCOUNTER — Ambulatory Visit: Payer: PRIVATE HEALTH INSURANCE | Admitting: Orthopaedic Surgery

## 2018-10-28 ENCOUNTER — Other Ambulatory Visit: Payer: Self-pay

## 2018-10-28 ENCOUNTER — Encounter: Payer: Self-pay | Admitting: Orthopaedic Surgery

## 2018-10-28 VITALS — BP 138/79 | HR 74 | Ht 65.0 in | Wt 146.0 lb

## 2018-10-28 DIAGNOSIS — M545 Low back pain, unspecified: Secondary | ICD-10-CM

## 2018-10-28 MED ORDER — METHYLPREDNISOLONE 4 MG PO TBPK: ORAL_TABLET | ORAL | 0 refills | Status: DC

## 2018-10-28 NOTE — Progress Notes (Signed)
Office Visit Note   Patient: Terri Hoover           Date of Birth: [birth year]           MRN: [id number] Visit Date: 10/28/2018              Requested by: No referring provider defined for this encounter. PCP: Patient, No Pcp Per   Assessment & Plan: Visit Diagnoses:  1. Acute midline low back pain without sciatica   2. Acute bilateral low back pain without sciatica     Plan: Acute onset of low back pain without obvious change on films.  No sciatic symptoms.  I have discussed activity modifications and will try a Medrol Dosepak.  If no improvement will will return over the next several weeks.  Consider MRI scan  Follow-Up Instructions: Return if symptoms worsen or fail to improve.   Orders:  Orders Placed This Encounter  Procedures  . XR Lumbar Spine 2-3 Views   Meds ordered this encounter  Medications  . methylPREDNISolone (MEDROL DOSEPAK) 4 MG TBPK tablet    Sig: Take as directed    Dispense:  21 tablet    Refill:  0      Procedures: No procedures performed   Clinical Data: No additional findings.   Subjective: Chief Complaint  Patient presents with  . Lower Back - Pain  Patient presents today for lower back pain X4 weeks. She was at the gym doing dead lifts and felt something "tug" in her lower back. She has tried OTC pain meds and a heating pad. No leg pain, and no numbness or tingling in her lower extremities.  Denies any abdominal complaints.  No GU issues.  Has had some pain in the past that responded quickly to Medrol  HPI  Review of Systems  Constitutional: Negative for fatigue.  HENT: Negative for ear pain.   Eyes: Negative for pain.  Respiratory: Negative for shortness of breath.   Cardiovascular: Negative for leg swelling.  Gastrointestinal: Negative for constipation and diarrhea.  Endocrine: Negative for cold intolerance and heat intolerance.  Genitourinary: Negative for difficulty urinating.  Musculoskeletal: Negative for joint swelling.   Skin: Negative for rash.  Allergic/Immunologic: Negative for food allergies.  Neurological: Negative for weakness.  Hematological: Does not bruise/bleed easily.  Psychiatric/Behavioral: Negative for sleep disturbance.     Objective: Vital Signs: BP 138/79   Pulse 74   Ht 5\' 5"  (1.651 m)   Wt 146 lb (66.2 kg)   BMI 24.30 kg/m   Physical Exam Constitutional:      Appearance: She is well-developed.  Eyes:     Pupils: Pupils are equal, round, and reactive to light.  Pulmonary:     Effort: Pulmonary effort is normal.  Skin:    General: Skin is warm and dry.  Neurological:     Mental Status: She is alert and oriented to person, place, and time.  Psychiatric:        Behavior: Behavior normal.     Ortho Exam awake alert and oriented x3.  Comfortable sitting.  Straight leg raise negative bilaterally.  Minimal hamstring tightness.  Could touch the tips of her fingers to the tips of her toes.  Some pain localized at L5-S1 with back flexion extension.  No percussible tenderness.  Pelvis level.  No sacroiliac pain.  No buttock thigh or leg pain.  Painless range of motion of both hips.  Ordered sensory exam intact.  No flank pain  Specialty Comments:  No specialty comments available.  Imaging: Xr Lumbar Spine 2-3 Views  Result Date: 10/28/2018 Films of the lumbar spine were obtained in 2 projections.  There is no evidence of a scoliosis.  No evidence of a listhesis.  No acute changes.  Some cyst formation in the pars at L4 but no evidence of a fracture.  Facet joints appear to be clear.  Sacroiliac joints also appear to be clear    PMFS History: Patient Active Problem List   Diagnosis Date Noted  . Low back pain 10/28/2018  . 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 Adjustment disorder with depressed mood 03/24/2014  . OAB (overactive bladder) 11/26/2013  . Family history of breast cancer 10/08/2012  . Hemorrhoid 03/05/2012  . Endometriosis   . Condyloma acuminatum   .  Hashimoto's disease   . Dysplasia of cervix, low grade (CIN 1) 06/26/2009   Past Medical History:  Diagnosis Date  . Anxiety   . ASCUS of cervix with negative high risk HPV 09/2018  . Frequency of urination   . Hashimoto's disease   . History of TMJ disorder   . Hypothyroidism     Family History  Problem Relation Age of Onset  . Heart disease Father   . Breast cancer Maternal Aunt 35  . Breast cancer Maternal Grandmother        Age 33    Past Surgical History:  Procedure Laterality Date  . BREAST BIOPSY Right 2016   benign  . COLPOSCOPY  2000  . CYSTO WITH HYDRODISTENSION N/A 05/06/2013   Procedure: CYSTOSCOPY/HYDRODISTENSION WITH TRIGGER POINT INJECTION ;  Surgeon: Alexis Frock, MD;  Location: [facility];  Service: Urology;  Laterality: N/A;  . DILATION AND CURETTAGE OF UTERUS  09-04-2002   W/  SUCTION FOR POSTPARTUM RETAINED PRODUCTS  . DX LAPAROSCOPY /  LASER ABLATION ENDOMETRIOSIS  2002  . TEMPOROMANDIBULAR JOINT SURGERY  2000   Social History   Occupational History  . Not on file  Tobacco Use  . Smoking status: Never Smoker  . Smokeless tobacco: Never Used  Substance and Sexual Activity  . Alcohol use: Yes    Alcohol/week: 0.0 standard drinks    Comment: OCCASIONALLY  . Drug use: No  . Sexual activity: Yes    Birth control/protection: Pill    Comment: intercourse age 31, less than 5 sexual partners

## 2018-10-30 ENCOUNTER — Telehealth: Payer: Self-pay

## 2018-10-30 NOTE — Telephone Encounter (Signed)
I called patient and read her My Chart message from 8.20.20 that was returned unread.  "Dr. Phineas Real commented on your Pap smear result. "Tell patient that her Pap smear showed minimal atypia with a negative HPV. By protocol I would recommend repeating the Pap smear in 1 year."   Atypia is a slight variation in a few of the cells and usually clears itself so nothing to do, just repeat the Pap smear as Dr. Phineas Real recommended."

## 2018-10-31 ENCOUNTER — Telehealth: Payer: Self-pay | Admitting: *Deleted

## 2018-10-31 ENCOUNTER — Other Ambulatory Visit: Payer: Self-pay | Admitting: *Deleted

## 2018-10-31 DIAGNOSIS — M545 Low back pain, unspecified: Secondary | ICD-10-CM

## 2018-10-31 NOTE — Telephone Encounter (Signed)
Patient called, MRI denied. Can Dr. Durward Fortes do a peer-to-peer? Thank you.

## 2018-10-31 NOTE — Telephone Encounter (Signed)
FYI - Patient called, back pain has worsened. MRI ordered.

## 2018-10-31 NOTE — Telephone Encounter (Signed)
thanks

## 2018-11-01 ENCOUNTER — Other Ambulatory Visit: Payer: PRIVATE HEALTH INSURANCE

## 2018-11-04 NOTE — Telephone Encounter (Signed)
Please call.

## 2018-11-12 NOTE — Telephone Encounter (Signed)
Please get me the phone number and the Peer-to-Peer review case number Thanks

## 2018-11-13 NOTE — Telephone Encounter (Signed)
[phone] 

## 2018-11-13 NOTE — Telephone Encounter (Signed)
What case number do I use??

## 2018-11-14 NOTE — Telephone Encounter (Signed)
No case has been set up yet. A case will have to be set up.

## 2018-11-18 ENCOUNTER — Encounter: Payer: Self-pay | Admitting: Gynecology

## 2018-11-24 NOTE — Telephone Encounter (Signed)
Please call her and see if she still has LBP and/or leg pain.  Any numbess or tingling in legs or buttox.

## 2018-11-28 NOTE — Telephone Encounter (Signed)
LMOM for patient

## 2018-12-28 HISTORY — PX: BREAST EXCISIONAL BIOPSY: SUR124

## 2019-01-13 ENCOUNTER — Other Ambulatory Visit: Payer: Self-pay | Admitting: Gynecology

## 2019-01-13 DIAGNOSIS — Z1231 Encounter for screening mammogram for malignant neoplasm of breast: Secondary | ICD-10-CM

## 2019-01-14 ENCOUNTER — Ambulatory Visit
Admission: RE | Admit: 2019-01-14 | Discharge: 2019-01-14 | Disposition: A | Discharge status: Home / Self Care | Payer: PRIVATE HEALTH INSURANCE | Source: Ambulatory Visit | Attending: Gynecology | Admitting: Gynecology

## 2019-01-14 ENCOUNTER — Other Ambulatory Visit: Payer: Self-pay

## 2019-01-14 DIAGNOSIS — Z1231 Encounter for screening mammogram for malignant neoplasm of breast: Secondary | ICD-10-CM

## 2019-01-15 ENCOUNTER — Other Ambulatory Visit: Payer: Self-pay | Admitting: Gynecology

## 2019-01-15 ENCOUNTER — Encounter: Payer: Self-pay | Admitting: Adult Health

## 2019-01-15 ENCOUNTER — Ambulatory Visit
Admission: RE | Admit: 2019-01-15 | Discharge: 2019-01-15 | Disposition: A | Discharge status: Home / Self Care | Payer: PRIVATE HEALTH INSURANCE | Source: Ambulatory Visit | Attending: Gynecology | Admitting: Gynecology

## 2019-01-15 ENCOUNTER — Other Ambulatory Visit: Payer: Self-pay | Admitting: Diagnostic Radiology

## 2019-01-15 ENCOUNTER — Ambulatory Visit (INDEPENDENT_AMBULATORY_CARE_PROVIDER_SITE_OTHER): Payer: PRIVATE HEALTH INSURANCE | Admitting: Adult Health

## 2019-01-15 DIAGNOSIS — N6489 Other specified disorders of breast: Secondary | ICD-10-CM

## 2019-01-15 DIAGNOSIS — F331 Major depressive disorder, recurrent, moderate: Secondary | ICD-10-CM

## 2019-01-15 DIAGNOSIS — F411 Generalized anxiety disorder: Secondary | ICD-10-CM | POA: Diagnosis not present

## 2019-01-15 DIAGNOSIS — R928 Other abnormal and inconclusive findings on diagnostic imaging of breast: Secondary | ICD-10-CM

## 2019-01-15 MED ORDER — FLUOXETINE HCL 40 MG PO CAPS: 40.0000 mg | ORAL_CAPSULE | Freq: Every day | ORAL | 3 refills | Status: DC

## 2019-01-15 MED ORDER — ALPRAZOLAM 0.25 MG PO TABS: 0.2500 mg | ORAL_TABLET | Freq: Every evening | ORAL | 2 refills | Status: DC | PRN

## 2019-01-15 NOTE — Progress Notes (Signed)
Terri Hoover [id number] [birth year] 43 y.o.   Virtual Visit via Telephone Note  I connected with pt on 01/15/19 at  3:40 PM EDT by telephone and verified that I am speaking with the correct person using two identifiers.   I discussed the limitations, risks, security and privacy concerns of performing an evaluation and management service by telephone and the availability of in person appointments. I also discussed with the patient that there may be a patient responsible charge related to this service. The patient expressed understanding and agreed to proceed.   I discussed the assessment and treatment plan with the patient. The patient was provided an opportunity to ask questions and all were answered. The patient agreed with the plan and demonstrated an understanding of the instructions.   The patient was advised to call back or seek an in-person evaluation if the symptoms worsen or if the condition fails to improve as anticipated.  I provided 20 minutes of non-face-to-face time during this encounter.  The patient was located at home.  The provider was located at [name].  Subjective:   Patient ID:  Terri Hoover is a 43 y.o. (DOB July 29, 1975) female.  Chief Complaint: No chief complaint on file.   HPI Terri Hoover presents to the office today for follow-up of anxiety and depression.  Describes mood today as "alright". Pleasant. Mood symptoms - denies depression, anxiety, and irritability. Stating "everything is going pretty good". Looking forward to the holidays. Stable interest and motivation. Taking medications as prescribed.  Energy levels stable. Active, does not have a regular exercise routine. Works full-time - Physicist, medical Enjoys some usual interests and activities. Spending time with family - husband and 2 daughters.  Appetite adequate. Weight stable. Sleeps well most nights. Averages 7 to 8 hours. Focus and concentration stable. Completing tasks.  Managing aspects of household. Work going well.  Denies SI or HI. Denies AH or VH.  Review of Systems:  Review of Systems  Musculoskeletal: Negative for gait problem.  Neurological: Negative for tremors.  Psychiatric/Behavioral:       Please refer to HPI    Medications: I have reviewed the patient's current medications.  Current Outpatient Medications  Medication Sig Dispense Refill  . ALPRAZolam (XANAX) 0.25 MG tablet Take 1 tablet (0.25 mg total) by mouth at bedtime as needed for anxiety. 30 tablet 2  . FLUoxetine (PROZAC) 20 MG tablet 1 1/2 tab po qd    . FLUoxetine (PROZAC) 40 MG capsule Take 1 capsule (40 mg total) by mouth daily. 90 capsule 3  . levothyroxine (SYNTHROID) 75 MCG tablet Take 75 mcg by mouth daily before breakfast.    . liothyronine (CYTOMEL) 5 MCG tablet Take 5 mcg by mouth daily.    . methylPREDNISolone (MEDROL DOSEPAK) 4 MG TBPK tablet Take as directed 21 tablet 0  . Multiple Vitamin (MULTIVITAMIN) tablet Take 1 tablet by mouth daily.    . norethindrone-ethinyl estradiol (JUNEL FE 1/20) 1-20 MG-MCG tablet Take 1 tablet by mouth once daily 84 tablet 4   No current facility-administered medications for this visit.     Medication Side Effects: None  Allergies: No Known Allergies  Past Medical History:  Diagnosis Date  . Anxiety   . ASCUS of cervix with negative high risk HPV 09/2018  . Frequency of urination   . Hashimoto's disease   . History of TMJ disorder   . Hypothyroidism     Family History  Problem Relation Age of Onset  . Heart disease  Father   . Breast cancer Maternal Aunt 35  . Breast cancer Maternal Grandmother        Age 16    Social History   Socioeconomic History  . Marital status: Married    Spouse name: Not on file  . Number of children: Not on file  . Years of education: Not on file  . Highest education level: Not on file  Occupational History  . Not on file  Social Needs  . Financial resource strain: Not on file  .  Food insecurity    Worry: Not on file    Inability: Not on file  . Transportation needs    Medical: Not on file    Non-medical: Not on file  Tobacco Use  . Smoking status: Never Smoker  . Smokeless tobacco: Never Used  Substance and Sexual Activity  . Alcohol use: Yes    Alcohol/week: 0.0 standard drinks    Comment: OCCASIONALLY  . Drug use: No  . Sexual activity: Yes    Birth control/protection: Pill    Comment: intercourse age 33, less than 51 sexual partners  Lifestyle  . Physical activity    Days per week: Not on file    Minutes per session: Not on file  . Stress: Not on file  Relationships  . Social Herbalist on phone: Not on file    Gets together: Not on file    Attends religious service: Not on file    Active member of club or organization: Not on file    Attends meetings of clubs or organizations: Not on file    Relationship status: Not on file  . Intimate partner violence    Fear of current or ex partner: Not on file    Emotionally abused: Not on file    Physically abused: Not on file    Forced sexual activity: Not on file  Other Topics Concern  . Not on file  Social History Narrative  . Not on file    Past Medical History, Surgical history, Social history, and Family history were reviewed and updated as appropriate.   Please see review of systems for further details on the patient's review from today.   Objective:   Physical Exam:  There were no vitals taken for this visit.  Physical Exam Constitutional:      General: She is not in acute distress.    Appearance: She is well-developed.  Musculoskeletal:        General: No deformity.  Neurological:     Mental Status: She is alert and oriented to person, place, and time.     Coordination: Coordination normal.  Psychiatric:        Attention and Perception: Attention and perception normal. She does not perceive auditory or visual hallucinations.        Mood and Affect: Mood normal. Mood is  not anxious or depressed. Affect is not labile, blunt, angry or inappropriate.        Speech: Speech normal.        Behavior: Behavior normal.        Thought Content: Thought content normal. Thought content is not paranoid or delusional. Thought content does not include homicidal or suicidal ideation. Thought content does not include homicidal or suicidal plan.        Cognition and Memory: Cognition and memory normal.        Judgment: Judgment normal.     Comments: Insight intact     Lab  Review:     Component Value Date/Time   NA 137 10/14/2018 1016   K 4.7 10/14/2018 1016   CL 104 10/14/2018 1016   CO2 24 10/14/2018 1016   GLUCOSE 87 10/14/2018 1016   BUN 18 10/14/2018 1016   CREATININE 0.81 10/14/2018 1016   CALCIUM 9.4 10/14/2018 1016   PROT 6.6 10/14/2018 1016   ALBUMIN 3.8 12/17/2014 0949   AST 17 10/14/2018 1016   ALT 15 10/14/2018 1016   ALKPHOS 40 12/17/2014 0949   BILITOT 0.5 10/14/2018 1016       Component Value Date/Time   WBC 6.8 10/14/2018 1016   RBC 4.75 10/14/2018 1016   HGB 14.3 10/14/2018 1016   HCT 42.4 10/14/2018 1016   PLT 229 10/14/2018 1016   MCV 89.3 10/14/2018 1016   MCH 30.1 10/14/2018 1016   MCHC 33.7 10/14/2018 1016   RDW 11.8 10/14/2018 1016   LYMPHSABS 1,319 10/14/2018 1016   MONOABS 0.3 12/17/2014 0949   EOSABS 150 10/14/2018 1016   BASOSABS 61 10/14/2018 1016    No results found for: POCLITH, LITHIUM   No results found for: PHENYTOIN, PHENOBARB, VALPROATE, CBMZ   .res Assessment: Plan:    Plan:  1. Continue Prozac 40 mg daily  2. Continue Xanax 0.25mg  prn - takes "once in a while"  RTC 6 months  Patient advised to contact office with any questions, adverse effects, or acute worsening in signs and symptoms.  Diagnoses and all orders for this visit:  Generalized anxiety disorder -     FLUoxetine (PROZAC) 40 MG capsule; Take 1 capsule (40 mg total) by mouth daily. -     ALPRAZolam (XANAX) 0.25 MG tablet; Take 1 tablet (0.25  mg total) by mouth at bedtime as needed for anxiety.  Major depressive disorder, recurrent episode, moderate (HCC) -     FLUoxetine (PROZAC) 40 MG capsule; Take 1 capsule (40 mg total) by mouth daily.     Please see After Visit Summary for patient specific instructions.  No future appointments.  No orders of the defined types were placed in this encounter.   -------------------------------

## 2019-01-19 ENCOUNTER — Ambulatory Visit: Payer: PRIVATE HEALTH INSURANCE | Admitting: Adult Health

## 2019-01-20 ENCOUNTER — Other Ambulatory Visit: Payer: Self-pay | Admitting: General Surgery

## 2019-01-20 DIAGNOSIS — R928 Other abnormal and inconclusive findings on diagnostic imaging of breast: Secondary | ICD-10-CM

## 2019-01-27 DIAGNOSIS — C50919 Malignant neoplasm of unspecified site of unspecified female breast: Secondary | ICD-10-CM

## 2019-01-27 HISTORY — DX: Malignant neoplasm of unspecified site of unspecified female breast: C50.919

## 2019-01-27 HISTORY — PX: BREAST LUMPECTOMY: SHX2

## 2019-02-03 ENCOUNTER — Other Ambulatory Visit: Payer: Self-pay

## 2019-02-03 ENCOUNTER — Encounter (HOSPITAL_BASED_OUTPATIENT_CLINIC_OR_DEPARTMENT_OTHER): Payer: Self-pay | Admitting: *Deleted

## 2019-02-04 ENCOUNTER — Encounter (HOSPITAL_BASED_OUTPATIENT_CLINIC_OR_DEPARTMENT_OTHER)
Admission: RE | Admit: 2019-02-04 | Discharge: 2019-02-04 | Disposition: A | Discharge status: Home / Self Care | Payer: PRIVATE HEALTH INSURANCE | Source: Ambulatory Visit | Attending: General Surgery | Admitting: General Surgery

## 2019-02-04 DIAGNOSIS — Z01812 Encounter for preprocedural laboratory examination: Secondary | ICD-10-CM | POA: Diagnosis not present

## 2019-02-04 LAB — POCT PREGNANCY, URINE: Preg Test, Ur: NEGATIVE

## 2019-02-04 NOTE — Progress Notes (Signed)
      Enhanced Recovery after Surgery for Orthopedics Enhanced Recovery after Surgery is a protocol used to improve the stress on your body and your recovery after surgery.  Patient Instructions  Drink DOS at 0430.  Marland Kitchen The night before surgery:  o No food after midnight. ONLY clear liquids after midnight  . The day of surgery (if you do NOT have diabetes):  o Drink ONE (1) Pre-Surgery Clear Ensure as directed.   o This drink was given to you during your hospital  pre-op appointment visit. o The pre-op nurse will instruct you on the time to drink the  Pre-Surgery Ensure depending on your surgery time. o Finish the drink at the designated time by the pre-op nurse.  o Nothing else to drink after completing the  Pre-Surgery Clear Ensure.  . The day of surgery (if you have diabetes): o Drink ONE (1) Gatorade 2 (G2) as directed. o This drink was given to you during your hospital  pre-op appointment visit.  o The pre-op nurse will instruct you on the time to drink the   Gatorade 2 (G2) depending on your surgery time. o Color of the Gatorade may vary. Red is not allowed. o Nothing else to drink after completing the  Gatorade 2 (G2).         If you have questions, please contact your surgeon's office.

## 2019-02-06 ENCOUNTER — Other Ambulatory Visit (HOSPITAL_COMMUNITY)
Admission: RE | Admit: 2019-02-06 | Discharge: 2019-02-06 | Disposition: A | Discharge status: Home / Self Care | Payer: PRIVATE HEALTH INSURANCE | Source: Ambulatory Visit | Attending: General Surgery | Admitting: General Surgery

## 2019-02-06 ENCOUNTER — Other Ambulatory Visit: Payer: Self-pay

## 2019-02-06 ENCOUNTER — Other Ambulatory Visit (HOSPITAL_COMMUNITY): Payer: PRIVATE HEALTH INSURANCE

## 2019-02-06 DIAGNOSIS — Z01812 Encounter for preprocedural laboratory examination: Secondary | ICD-10-CM | POA: Diagnosis not present

## 2019-02-06 DIAGNOSIS — Z20828 Contact with and (suspected) exposure to other viral communicable diseases: Secondary | ICD-10-CM | POA: Insufficient documentation

## 2019-02-06 LAB — SARS CORONAVIRUS 2 (TAT 6-24 HRS): SARS Coronavirus 2: NEGATIVE

## 2019-02-09 ENCOUNTER — Ambulatory Visit
Admission: RE | Admit: 2019-02-09 | Discharge: 2019-02-09 | Disposition: A | Discharge status: Home / Self Care | Payer: PRIVATE HEALTH INSURANCE | Source: Ambulatory Visit | Attending: General Surgery | Admitting: General Surgery

## 2019-02-09 ENCOUNTER — Other Ambulatory Visit: Payer: Self-pay

## 2019-02-09 DIAGNOSIS — R928 Other abnormal and inconclusive findings on diagnostic imaging of breast: Secondary | ICD-10-CM

## 2019-02-10 ENCOUNTER — Ambulatory Visit
Admission: RE | Admit: 2019-02-10 | Discharge: 2019-02-10 | Disposition: A | Discharge status: Home / Self Care | Payer: PRIVATE HEALTH INSURANCE | Source: Ambulatory Visit | Attending: General Surgery | Admitting: General Surgery

## 2019-02-10 ENCOUNTER — Ambulatory Visit (HOSPITAL_BASED_OUTPATIENT_CLINIC_OR_DEPARTMENT_OTHER): Payer: PRIVATE HEALTH INSURANCE | Admitting: Anesthesiology

## 2019-02-10 ENCOUNTER — Other Ambulatory Visit: Payer: Self-pay

## 2019-02-10 ENCOUNTER — Encounter (HOSPITAL_BASED_OUTPATIENT_CLINIC_OR_DEPARTMENT_OTHER)
Admission: RE | Disposition: A | Discharge status: Home / Self Care | Payer: Self-pay | Source: Home / Self Care | Attending: General Surgery

## 2019-02-10 ENCOUNTER — Ambulatory Visit (HOSPITAL_BASED_OUTPATIENT_CLINIC_OR_DEPARTMENT_OTHER)
Admission: RE | Admit: 2019-02-10 | Discharge: 2019-02-10 | Disposition: A | Discharge status: Home / Self Care | Payer: PRIVATE HEALTH INSURANCE | Attending: General Surgery | Admitting: General Surgery

## 2019-02-10 ENCOUNTER — Encounter (HOSPITAL_BASED_OUTPATIENT_CLINIC_OR_DEPARTMENT_OTHER): Payer: Self-pay | Admitting: General Surgery

## 2019-02-10 DIAGNOSIS — C50911 Malignant neoplasm of unspecified site of right female breast: Secondary | ICD-10-CM | POA: Insufficient documentation

## 2019-02-10 DIAGNOSIS — N6489 Other specified disorders of breast: Secondary | ICD-10-CM | POA: Insufficient documentation

## 2019-02-10 DIAGNOSIS — F419 Anxiety disorder, unspecified: Secondary | ICD-10-CM | POA: Insufficient documentation

## 2019-02-10 DIAGNOSIS — Z793 Long term (current) use of hormonal contraceptives: Secondary | ICD-10-CM | POA: Diagnosis not present

## 2019-02-10 DIAGNOSIS — Z79899 Other long term (current) drug therapy: Secondary | ICD-10-CM | POA: Diagnosis not present

## 2019-02-10 DIAGNOSIS — Z79890 Hormone replacement therapy (postmenopausal): Secondary | ICD-10-CM | POA: Insufficient documentation

## 2019-02-10 DIAGNOSIS — F329 Major depressive disorder, single episode, unspecified: Secondary | ICD-10-CM | POA: Insufficient documentation

## 2019-02-10 DIAGNOSIS — E039 Hypothyroidism, unspecified: Secondary | ICD-10-CM | POA: Insufficient documentation

## 2019-02-10 DIAGNOSIS — N6011 Diffuse cystic mastopathy of right breast: Secondary | ICD-10-CM | POA: Diagnosis not present

## 2019-02-10 DIAGNOSIS — R928 Other abnormal and inconclusive findings on diagnostic imaging of breast: Secondary | ICD-10-CM

## 2019-02-10 HISTORY — PX: RADIOACTIVE SEED GUIDED EXCISIONAL BREAST BIOPSY: SHX6490

## 2019-02-10 SURGERY — RADIOACTIVE SEED GUIDED BREAST BIOPSY
Anesthesia: General | Site: Breast | Laterality: Right

## 2019-02-10 MED ORDER — FENTANYL CITRATE (PF) 100 MCG/2ML IJ SOLN
INTRAMUSCULAR | Status: AC
  Filled 2019-02-10: qty 2

## 2019-02-10 MED ORDER — LIDOCAINE HCL (CARDIAC) PF 100 MG/5ML IV SOSY
PREFILLED_SYRINGE | INTRAVENOUS | Status: DC | PRN
  Administered 2019-02-10: 50 mg via INTRAVENOUS

## 2019-02-10 MED ORDER — CEFAZOLIN SODIUM-DEXTROSE 2-4 GM/100ML-% IV SOLN
2.0000 g | INTRAVENOUS | Status: AC
  Administered 2019-02-10: 2 g via INTRAVENOUS

## 2019-02-10 MED ORDER — PROPOFOL 10 MG/ML IV BOLUS
INTRAVENOUS | Status: AC
  Filled 2019-02-10: qty 20

## 2019-02-10 MED ORDER — LACTATED RINGERS IV SOLN: INTRAVENOUS | Status: DC

## 2019-02-10 MED ORDER — ONDANSETRON HCL 4 MG/2ML IJ SOLN
INTRAMUSCULAR | Status: AC
  Filled 2019-02-10: qty 2

## 2019-02-10 MED ORDER — PROPOFOL 10 MG/ML IV BOLUS
INTRAVENOUS | Status: DC | PRN
  Administered 2019-02-10: 200 mg via INTRAVENOUS

## 2019-02-10 MED ORDER — TRAMADOL HCL 50 MG PO TABS: 50.0000 mg | ORAL_TABLET | Freq: Four times a day (QID) | ORAL | 0 refills | Status: DC | PRN

## 2019-02-10 MED ORDER — FENTANYL CITRATE (PF) 100 MCG/2ML IJ SOLN
INTRAMUSCULAR | Status: DC | PRN
  Administered 2019-02-10 (×2): 50 ug via INTRAVENOUS

## 2019-02-10 MED ORDER — MIDAZOLAM HCL 2 MG/2ML IJ SOLN
INTRAMUSCULAR | Status: AC
  Filled 2019-02-10: qty 2

## 2019-02-10 MED ORDER — LIDOCAINE HCL (PF) 1 % IJ SOLN
INTRAMUSCULAR | Status: AC
  Filled 2019-02-10: qty 30

## 2019-02-10 MED ORDER — HEPARIN SOD (PORK) LOCK FLUSH 100 UNIT/ML IV SOLN
INTRAVENOUS | Status: AC
  Filled 2019-02-10: qty 5

## 2019-02-10 MED ORDER — MIDAZOLAM HCL 5 MG/5ML IJ SOLN
INTRAMUSCULAR | Status: DC | PRN
  Administered 2019-02-10: 2 mg via INTRAVENOUS

## 2019-02-10 MED ORDER — SODIUM CHLORIDE (PF) 0.9 % IJ SOLN
INTRAMUSCULAR | Status: AC
  Filled 2019-02-10: qty 10

## 2019-02-10 MED ORDER — KETOROLAC TROMETHAMINE 15 MG/ML IJ SOLN
15.0000 mg | INTRAMUSCULAR | Status: AC
  Administered 2019-02-10: 15 mg via INTRAVENOUS

## 2019-02-10 MED ORDER — HEPARIN (PORCINE) IN NACL 1000-0.9 UT/500ML-% IV SOLN
INTRAVENOUS | Status: AC
  Filled 2019-02-10: qty 500

## 2019-02-10 MED ORDER — LIDOCAINE 2% (20 MG/ML) 5 ML SYRINGE
INTRAMUSCULAR | Status: AC
  Filled 2019-02-10: qty 20

## 2019-02-10 MED ORDER — BUPIVACAINE HCL (PF) 0.25 % IJ SOLN
INTRAMUSCULAR | Status: AC
  Filled 2019-02-10: qty 150

## 2019-02-10 MED ORDER — ACETAMINOPHEN 500 MG PO TABS
ORAL_TABLET | ORAL | Status: AC
  Filled 2019-02-10: qty 2

## 2019-02-10 MED ORDER — OXYMETAZOLINE HCL 0.05 % NA SOLN
NASAL | Status: AC
  Filled 2019-02-10: qty 30

## 2019-02-10 MED ORDER — METHYLENE BLUE 0.5 % INJ SOLN
INTRAVENOUS | Status: AC
  Filled 2019-02-10: qty 10

## 2019-02-10 MED ORDER — BACITRACIN ZINC 500 UNIT/GM EX OINT
TOPICAL_OINTMENT | CUTANEOUS | Status: AC
  Filled 2019-02-10: qty 28.35

## 2019-02-10 MED ORDER — LIDOCAINE 2% (20 MG/ML) 5 ML SYRINGE
INTRAMUSCULAR | Status: AC
  Filled 2019-02-10: qty 5

## 2019-02-10 MED ORDER — KETOROLAC TROMETHAMINE 15 MG/ML IJ SOLN
INTRAMUSCULAR | Status: AC
  Filled 2019-02-10: qty 1

## 2019-02-10 MED ORDER — BUPIVACAINE HCL (PF) 0.5 % IJ SOLN
INTRAMUSCULAR | Status: AC
  Filled 2019-02-10: qty 30

## 2019-02-10 MED ORDER — ACETAMINOPHEN 500 MG PO TABS
1000.0000 mg | ORAL_TABLET | ORAL | Status: AC
  Administered 2019-02-10: 1000 mg via ORAL

## 2019-02-10 MED ORDER — DEXAMETHASONE SODIUM PHOSPHATE 10 MG/ML IJ SOLN
INTRAMUSCULAR | Status: AC
  Filled 2019-02-10: qty 3

## 2019-02-10 MED ORDER — DEXAMETHASONE SODIUM PHOSPHATE 10 MG/ML IJ SOLN
INTRAMUSCULAR | Status: AC
  Filled 2019-02-10: qty 1

## 2019-02-10 MED ORDER — GABAPENTIN 100 MG PO CAPS
100.0000 mg | ORAL_CAPSULE | ORAL | Status: AC
  Administered 2019-02-10: 100 mg via ORAL

## 2019-02-10 MED ORDER — CEFAZOLIN SODIUM-DEXTROSE 2-4 GM/100ML-% IV SOLN
INTRAVENOUS | Status: AC
  Filled 2019-02-10: qty 100

## 2019-02-10 MED ORDER — BUPIVACAINE HCL (PF) 0.25 % IJ SOLN
INTRAMUSCULAR | Status: DC | PRN
  Administered 2019-02-10: 10 mL

## 2019-02-10 MED ORDER — GABAPENTIN 100 MG PO CAPS
ORAL_CAPSULE | ORAL | Status: AC
  Filled 2019-02-10: qty 1

## 2019-02-10 MED ORDER — LIDOCAINE-EPINEPHRINE 1 %-1:100000 IJ SOLN
INTRAMUSCULAR | Status: AC
  Filled 2019-02-10: qty 1

## 2019-02-10 MED ORDER — ONDANSETRON HCL 4 MG/2ML IJ SOLN
INTRAMUSCULAR | Status: DC | PRN
  Administered 2019-02-10: 4 mg via INTRAVENOUS

## 2019-02-10 MED ORDER — BUPIVACAINE HCL (PF) 0.25 % IJ SOLN
INTRAMUSCULAR | Status: AC
  Filled 2019-02-10: qty 30

## 2019-02-10 MED ORDER — DEXAMETHASONE SODIUM PHOSPHATE 4 MG/ML IJ SOLN
INTRAMUSCULAR | Status: DC | PRN
  Administered 2019-02-10: 4 mg via INTRAVENOUS

## 2019-02-10 SURGICAL SUPPLY — 46 items
ADH SKN CLS APL DERMABOND .7 (GAUZE/BANDAGES/DRESSINGS) ×1
APL PRP STRL LF DISP 70% ISPRP (MISCELLANEOUS) ×1
BINDER BREAST LRG (GAUZE/BANDAGES/DRESSINGS) ×1 IMPLANT
BINDER BREAST MEDIUM (GAUZE/BANDAGES/DRESSINGS) IMPLANT
BLADE SURG 15 STRL LF DISP TIS (BLADE) ×1 IMPLANT
BLADE SURG 15 STRL SS (BLADE) ×2
CHLORAPREP W/TINT 26 (MISCELLANEOUS) ×2 IMPLANT
CLIP VESOCCLUDE SM WIDE 6/CT (CLIP) ×1 IMPLANT
COVER BACK TABLE REUSABLE LG (DRAPES) ×2 IMPLANT
COVER MAYO STAND REUSABLE (DRAPES) ×2 IMPLANT
COVER PROBE W GEL 5X96 (DRAPES) ×2 IMPLANT
DERMABOND ADVANCED (GAUZE/BANDAGES/DRESSINGS) ×1
DERMABOND ADVANCED .7 DNX12 (GAUZE/BANDAGES/DRESSINGS) ×1 IMPLANT
DRAPE LAPAROSCOPIC ABDOMINAL (DRAPES) ×2 IMPLANT
DRAPE UTILITY XL STRL (DRAPES) ×2 IMPLANT
ELECT COATED BLADE 2.86 ST (ELECTRODE) ×2 IMPLANT
ELECT REM PT RETURN 9FT ADLT (ELECTROSURGICAL) ×2
ELECTRODE REM PT RTRN 9FT ADLT (ELECTROSURGICAL) ×1 IMPLANT
GLOVE BIO SURGEON STRL SZ 6.5 (GLOVE) ×1 IMPLANT
GLOVE BIO SURGEON STRL SZ7 (GLOVE) ×4 IMPLANT
GLOVE BIOGEL PI IND STRL 7.0 (GLOVE) IMPLANT
GLOVE BIOGEL PI IND STRL 7.5 (GLOVE) ×1 IMPLANT
GLOVE BIOGEL PI INDICATOR 7.0 (GLOVE) ×2
GLOVE BIOGEL PI INDICATOR 7.5 (GLOVE) ×1
GOWN STRL REUS W/ TWL LRG LVL3 (GOWN DISPOSABLE) ×2 IMPLANT
GOWN STRL REUS W/TWL LRG LVL3 (GOWN DISPOSABLE) ×4
HEMOSTAT ARISTA ABSORB 3G PWDR (HEMOSTASIS) IMPLANT
KIT MARKER MARGIN INK (KITS) ×2 IMPLANT
NDL HYPO 25X1 1.5 SAFETY (NEEDLE) ×1 IMPLANT
NEEDLE HYPO 25X1 1.5 SAFETY (NEEDLE) ×2 IMPLANT
NS IRRIG 1000ML POUR BTL (IV SOLUTION) ×1 IMPLANT
PACK BASIN DAY SURGERY FS (CUSTOM PROCEDURE TRAY) ×2 IMPLANT
PENCIL SMOKE EVACUATOR (MISCELLANEOUS) ×2 IMPLANT
RETRACTOR ONETRAX LX 90X20 (MISCELLANEOUS) ×1 IMPLANT
SLEEVE SCD COMPRESS KNEE MED (MISCELLANEOUS) ×2 IMPLANT
SPONGE LAP 4X18 RFD (DISPOSABLE) ×2 IMPLANT
STRIP CLOSURE SKIN 1/2X4 (GAUZE/BANDAGES/DRESSINGS) ×2 IMPLANT
SUT MON AB 5-0 PS2 18 (SUTURE) ×1 IMPLANT
SUT SILK 2 0 SH (SUTURE) ×1 IMPLANT
SUT VIC AB 2-0 SH 27 (SUTURE) ×2
SUT VIC AB 2-0 SH 27XBRD (SUTURE) ×1 IMPLANT
SUT VIC AB 3-0 SH 27 (SUTURE) ×2
SUT VIC AB 3-0 SH 27X BRD (SUTURE) ×1 IMPLANT
SYR CONTROL 10ML LL (SYRINGE) ×2 IMPLANT
TOWEL GREEN STERILE FF (TOWEL DISPOSABLE) ×2 IMPLANT
TRAY FAXITRON CT DISP (TRAY / TRAY PROCEDURE) ×2 IMPLANT

## 2019-02-10 NOTE — Anesthesia Preprocedure Evaluation (Addendum)
Anesthesia Evaluation  Patient identified by MRN, date of birth, ID band Patient awake    Reviewed: Allergy & Precautions, H&P , NPO status , Patient's Chart, lab work & pertinent test results  Airway Mallampati: II  TM Distance: >3 FB Neck ROM: Full   Comment: TMJ Dental no notable dental hx.    Pulmonary neg pulmonary ROS,    Pulmonary exam normal breath sounds clear to auscultation       Cardiovascular Exercise Tolerance: Good negative cardio ROS Normal cardiovascular exam Rhythm:Regular Rate:Normal     Neuro/Psych Anxiety Depression negative neurological ROS  negative psych ROS   GI/Hepatic Neg liver ROS, GERD  ,  Endo/Other  Hypothyroidism   Renal/GU negative Renal ROS  negative genitourinary   Musculoskeletal negative musculoskeletal ROS (+)   Abdominal   Peds negative pediatric ROS (+)  Hematology negative hematology ROS (+)   Anesthesia Other Findings   Reproductive/Obstetrics negative OB ROS                            Anesthesia Physical  Anesthesia Plan  ASA: II  Anesthesia Plan: General   Post-op Pain Management:    Induction: Intravenous  PONV Risk Score and Plan: 3 and Ondansetron, Dexamethasone, Midazolam and Treatment may vary due to age or medical condition  Airway Management Planned: LMA  Additional Equipment:   Intra-op Plan:   Post-operative Plan: Extubation in OR  Informed Consent: I have reviewed the patients History and Physical, chart, labs and discussed the procedure including the risks, benefits and alternatives for the proposed anesthesia with the patient or authorized representative who has indicated his/her understanding and acceptance.     Dental advisory given  Plan Discussed with: CRNA  Anesthesia Plan Comments:         Anesthesia Quick Evaluation

## 2019-02-10 NOTE — Anesthesia Procedure Notes (Signed)
Procedure Name: LMA Insertion Date/Time: 02/10/2019 7:36 AM Performed by: Oletta Lamas, CRNA Pre-anesthesia Checklist: Patient identified, Emergency Drugs available, Suction available and Patient being monitored Patient Re-evaluated:Patient Re-evaluated prior to induction Oxygen Delivery Method: Circle System Utilized Preoxygenation: Pre-oxygenation with 100% oxygen Induction Type: IV induction Ventilation: Mask ventilation without difficulty LMA: LMA inserted LMA Size: 4.0 Number of attempts: 1 Placement Confirmation: positive ETCO2 Tube secured with: Tape Dental Injury: Teeth and Oropharynx as per pre-operative assessment

## 2019-02-10 NOTE — Anesthesia Postprocedure Evaluation (Signed)
Anesthesia Post Note  Patient: Terri Hoover  Procedure(s) Performed: RADIOACTIVE SEED GUIDED EXCISIONAL RIGHT BREAST BIOPSY (Right Breast)     Patient location during evaluation: PACU Anesthesia Type: General Level of consciousness: awake and alert Pain management: pain level controlled Vital Signs Assessment: post-procedure vital signs reviewed and stable Respiratory status: spontaneous breathing, nonlabored ventilation and respiratory function stable Cardiovascular status: blood pressure returned to baseline and stable Postop Assessment: no apparent nausea or vomiting Anesthetic complications: no    Last Vitals:  Vitals:   02/10/19 0845 02/10/19 0903  BP: 114/71 129/61  Pulse: 81 74  Resp: 16 18  Temp:  36.5 C  SpO2: 98% 100%    Last Pain:  Vitals:   02/10/19 0903  TempSrc:   PainSc: 0-No pain                 Lynda Rainwater

## 2019-02-10 NOTE — Transfer of Care (Signed)
Immediate Anesthesia Transfer of Care Note  Patient: Terri Hoover  Procedure(s) Performed: RADIOACTIVE SEED GUIDED EXCISIONAL RIGHT BREAST BIOPSY (Right Breast)  Patient Location: PACU  Anesthesia Type:General  Level of Consciousness: drowsy and patient cooperative  Airway & Oxygen Therapy: Patient Spontanous Breathing and Patient connected to face mask oxygen  Post-op Assessment: Report given to RN and Post -op Vital signs reviewed and stable  Post vital signs: Reviewed and stable  Last Vitals:  Vitals Value Taken Time  BP 101/61 02/10/19 0826  Temp    Pulse 60 02/10/19 0826  Resp 11 02/10/19 0826  SpO2 98 % 02/10/19 0826  Vitals shown include unvalidated device data.  Last Pain:  Vitals:   02/10/19 0627  TempSrc: Oral  PainSc: 0-No pain      Patients Stated Pain Goal: 7 ([id number])  Complications: No apparent anesthesia complications

## 2019-02-10 NOTE — Discharge Instructions (Signed)
Pt took tylenol at 0630 am/ no tylenol products till after 1030 am    [place] Office Phone Number [phone]  BREAST BIOPSY/ PARTIAL MASTECTOMY: POST OP INSTRUCTIONS Take 400 mg of ibuprofen every 8 hours or 650 mg tylenol every 6 hours for next 72 hours then as needed. Use ice several times daily also. Always review your discharge instruction sheet given to you by the facility where your surgery was performed.  IF YOU HAVE DISABILITY OR FAMILY LEAVE FORMS, YOU MUST BRING THEM TO THE OFFICE FOR PROCESSING.  DO NOT GIVE THEM TO YOUR DOCTOR.  1. A prescription for pain medication may be given to you upon discharge.  Take your pain medication as prescribed, if needed.  If narcotic pain medicine is not needed, then you may take acetaminophen (Tylenol), naprosyn (Alleve) or ibuprofen (Advil) as needed. 2. Take your usually prescribed medications unless otherwise directed 3. If you need a refill on your pain medication, please contact your pharmacy.  They will contact our office to request authorization.  Prescriptions will not be filled after 5pm or on week-ends. 4. You should eat very light the first 24 hours after surgery, such as soup, crackers, pudding, etc.  Resume your normal diet the day after surgery. 5. Most patients will experience some swelling and bruising in the breast.  Ice packs and a good support bra will help.  Wear the breast binder provided or a sports bra for 72 hours day and night.  After that wear a sports bra during the day until you return to the office. Swelling and bruising can take several days to resolve.  6. It is common to experience some constipation if taking pain medication after surgery.  Increasing fluid intake and taking a stool softener will usually help or prevent this problem from occurring.  A mild laxative (Milk of Magnesia or Miralax) should be taken according to package directions if there are no bowel movements after 48 hours. 7. Unless  discharge instructions indicate otherwise, you may remove your bandages 48 hours after surgery and you may shower at that time.  You may have steri-strips (small skin tapes) in place directly over the incision.  These strips should be left on the skin for 7-10 days and will come off on their own.  If your surgeon used skin glue on the incision, you may shower in 24 hours.  The glue will flake off over the next 2-3 weeks.  Any sutures or staples will be removed at the office during your follow-up visit. 8. ACTIVITIES:  You may resume regular daily activities (gradually increasing) beginning the next day.  Wearing a good support bra or sports bra minimizes pain and swelling.  You may have sexual intercourse when it is comfortable. a. You may drive when you no longer are taking prescription pain medication, you can comfortably wear a seatbelt, and you can safely maneuver your car and apply brakes. b. RETURN TO WORK:  ______________________________________________________________________________________ 9. You should see your doctor in the office for a follow-up appointment approximately two weeks after your surgery.  Your doctor's nurse will typically make your follow-up appointment when she calls you with your pathology report.  Expect your pathology report 3-4 business days after your surgery.  You may call to check if you do not hear from [place] after three days. 10. OTHER INSTRUCTIONS: _______________________________________________________________________________________________ _____________________________________________________________________________________________________________________________________ _____________________________________________________________________________________________________________________________________ _____________________________________________________________________________________________________________________________________  WHEN TO CALL DR  WA[place]: 1. Fever over 101.0 2. Nausea and/or vomiting. 3. Extreme swelling or bruising. 4.  Continued bleeding from incision. 5. Increased pain, redness, or drainage from the incision.  The clinic staff is available to answer your questions during regular business hours.  Please don't hesitate to call and ask to speak to one of the nurses for clinical concerns.  If you have a medical emergency, go to the nearest emergency room or call 911.  A surgeon from [facility] is always on call at the hospital.  For further questions, please visit [email or web address]    Post Anesthesia Home Care Instructions  Activity: Get plenty of rest for the remainder of the day. A responsible individual must stay with you for 24 hours following the procedure.  For the next 24 hours, DO NOT: -Drive a car -Paediatric nurse -Drink alcoholic beverages -Take any medication unless instructed by your physician -Make any legal decisions or sign important papers.  Meals: Start with liquid foods such as gelatin or soup. Progress to regular foods as tolerated. Avoid greasy, spicy, heavy foods. If nausea and/or vomiting occur, drink only clear liquids until the nausea and/or vomiting subsides. Call your physician if vomiting continues.  Special Instructions/Symptoms: Your throat may feel dry or sore from the anesthesia or the breathing tube placed in your throat during surgery. If this causes discomfort, gargle with warm salt water. The discomfort should disappear within 24 hours.  If you had a scopolamine patch placed behind your ear for the management of post- operative nausea and/or vomiting:  1. The medication in the patch is effective for 72 hours, after which it should be removed.  Wrap patch in a tissue and discard in the trash. Wash hands thoroughly with soap and water. 2. You may remove the patch earlier than 72 hours if you experience unpleasant side effects which may include dry  mouth, dizziness or visual disturbances. 3. Avoid touching the patch. Wash your hands with soap and water after contact with the patch.     Call your surgeon if you experience:   1.  Fever over 101.0. 2.  Inability to urinate. 3.  Nausea and/or vomiting. 4.  Extreme swelling or bruising at the surgical site. 5.  Continued bleeding from the incision. 6.  Increased pain, redness or drainage from the incision. 7.  Problems related to your pain medication. 8.  Any problems and/or concerns

## 2019-02-10 NOTE — H&P (Signed)
43 yof referred by Dr Shelly Bombard for right breast distortion. she has a family history in maternal aunt with breast cancer in 90s, maternal gm with bca at age 43 and mat great aunt with bca in 3s. she has prior benign radiologic biopsies. she has c density breasts. she had no mass or dc. she underwent screening mm that shows a right breast distortion. spot mammograms confirmed this. US shows no correlate. biopsy was done and is focal pash. she had four cores taken. this is discordant and she is referred for discussion of excision   Past Surgical History (April Staton, CMA; 01/20/2019 8:30 AM) Breast Biopsy  Bilateral. Oral Surgery   Diagnostic Studies History (April Staton, CMA; 01/20/2019 8:30 AM) Colonoscopy  never Mammogram  within last year Pap Smear  1-5 years ago  Allergies (April Staton, CMA; 01/20/2019 8:30 AM) No Known Drug Allergies [01/20/2019]:  Medication History (April Staton, CMA; 01/20/2019 8:31 AM) Lenda Kelp FE 1/20 (1-20MG -MCG Tablet, Oral) Active. FLUoxetine HCl (40MG  Capsule, Oral) Active. Levothyroxine Sodium (75MCG Tablet, Oral) Active. Liothyronine Sodium (5MCG Tablet, Oral) Active. Magnesium (Oral) Specific strength unknown - Active. Multi-Vitamin (Oral) Active. Medications Reconciled  Social History (April Staton, CMA; 01/20/2019 8:30 AM) Caffeine use  Coffee. No alcohol use  No drug use  Tobacco use  Never smoker.  Family History (April Staton, [place]; 01/20/2019 8:30 AM) Heart Disease  Father. Heart disease in female family member before age 21  Hypertension  Father.  Pregnancy / Birth History (April Staton, [place]; 01/20/2019 8:30 AM) Age at menarche  28 years. Contraceptive History  Oral contraceptives. Gravida  2 Irregular periods  Length (months) of breastfeeding  3-6 Maternal age  66-25 Para  2  Other Problems (April Staton, CMA; 01/20/2019 8:30 AM) Anxiety Disorder  Bladder Problems  Thyroid Disease     Review  of Systems (April Staton CMA; 01/20/2019 8:30 AM) General Not Present- Appetite Loss, Chills, Fatigue, Fever, Night Sweats, Weight Gain and Weight Loss. Skin Not Present- Change in Wart/Mole, Dryness, Hives, Jaundice, New Lesions, Non-Healing Wounds, Rash and Ulcer. HEENT Not Present- Earache, Hearing Loss, Hoarseness, Nose Bleed, Oral Ulcers, Ringing in the Ears, Seasonal Allergies, Sinus Pain, Sore Throat, Visual Disturbances, Wears glasses/contact lenses and Yellow Eyes. Respiratory Not Present- Bloody sputum, Chronic Cough, Difficulty Breathing, Snoring and Wheezing. Breast Present- Breast Mass. Not Present- Breast Pain, Nipple Discharge and Skin Changes. Cardiovascular Not Present- Chest Pain, Difficulty Breathing Lying Down, Leg Cramps, Palpitations, Rapid Heart Rate, Shortness of Breath and Swelling of Extremities. Gastrointestinal Not Present- Abdominal Pain, Bloating, Bloody Stool, Change in Bowel Habits, Chronic diarrhea, Constipation, Difficulty Swallowing, Excessive gas, Gets full quickly at meals, Hemorrhoids, Indigestion, Nausea, Rectal Pain and Vomiting. Female Genitourinary Present- Frequency. Not Present- Nocturia, Painful Urination, Pelvic Pain and Urgency. Musculoskeletal Not Present- Back Pain, Joint Pain, Joint Stiffness, Muscle Pain, Muscle Weakness and Swelling of Extremities. Neurological Not Present- Decreased Memory, Fainting, Headaches, Numbness, Seizures, Tingling, Tremor, Trouble walking and Weakness. Psychiatric Not Present- Anxiety, Bipolar, Change in Sleep Pattern, Depression, Fearful and Frequent crying. Endocrine Not Present- Cold Intolerance, Excessive Hunger, Hair Changes, Heat Intolerance, Hot flashes and New Diabetes. Hematology Not Present- Blood Thinners, Easy Bruising, Excessive bleeding, Gland problems, HIV and Persistent Infections.  Vitals (April Staton CMA; 01/20/2019 8:32 AM) 01/20/2019 8:31 AM Weight: 155.5 lb Height: 65in Body Surface Area: 1.78  m Body Mass Index: 25.88 kg/m  Temp.: 97.14F(Oral)  BP: 128/80 (Sitting, Left Arm, Standard)       Physical Exam Rolm Bookbinder MD; 01/20/2019 9:04 AM)  General Mental Status-Alert. Orientation-Oriented X3.  Head and Neck Trachea-midline.  Eye Sclera/Conjunctiva - Bilateral-No scleral icterus.  Chest and Lung Exam Chest and lung exam reveals -quiet, even and easy respiratory effort with no use of accessory muscles.  Breast Nipples-No Discharge. Breast Lump-No Palpable Breast Mass.  Neurologic Neurologic evaluation reveals -alert and oriented x 3 with no impairment of recent or remote memory.  Lymphatic Head & Neck  General Head & Neck Lymphatics: Bilateral - Description - Normal. Axillary  General Axillary Region: Bilateral - Description - Normal. Note: no [place] adenopathy     Assessment & Plan Rolm Bookbinder MD; 01/20/2019 9:07 AM) ABNORMAL MAMMOGRAM OF RIGHT BREAST (R92.8) Story: Right breast seed guided excisional biopsy discussed observation vs excision. she is at elevated risk for breast cancer. we will plan on seed guided excision. process, risk and recovery all discussed with her and will schedule

## 2019-02-10 NOTE — Interval H&P Note (Signed)
History and Physical Interval Note:  02/10/2019 7:25 AM  Terri Hoover  has presented today for surgery, with the diagnosis of ABNORMAL RIGHT BREAST MAMMOGRAM.  The various methods of treatment have been discussed with the patient and family. After consideration of risks, benefits and other options for treatment, the patient has consented to  Procedure(s): RADIOACTIVE SEED GUIDED EXCISIONAL RIGHT BREAST BIOPSY (Right) as a surgical intervention.  The patient's history has been reviewed, patient examined, no change in status, stable for surgery.  I have reviewed the patient's chart and labs.  Questions were answered to the patient's satisfaction.     Rolm Bookbinder

## 2019-02-10 NOTE — Op Note (Signed)
Preoperative diagnosis: Right breast mammographic distortion with core biopsy showing pash and is discordant Postoperative diagnosis: Same as above Procedure: Right breast radioactive seed guided excisional biopsy Surgeon: Dr. Serita Grammes Anesthesia: General Estimated blood loss: Minimal Complications: None Drains: None Specimens: Right breast tissue marked with paint, additional inferior margin marked short superior long lateral double deep Sponge needle count was correct at completion Disposition to recovery stable condition  Indications: 66 yof  underwent screening mm that shows a right breast distortion. spot mammograms confirmed this. US shows no correlate. biopsy was done and is focal pash. she had four cores taken. this is discordant and she is referred for discussion of excision. I discussed seed placement with Dr Enriqueta Shutter prior to surgery.  Procedure: After informed consent was obtained the patient was given antibiotics.  SCDs were placed.  She was placed under general anesthesia without complication.  Her right breast was prepped and draped in the standard sterile surgical fashion.  A surgical timeout was then performed.  Identified the radioactive seed.  I infiltrated Marcaine along the ineferior  border of the areola.  I then made a periareolar incision in order to hide the scar later.  I used the neoprobe to guide the removal of the seed and the surrounding tissue.  Mammogram was taken confirming removal of the seed and the clips.  due to concern for cancer and seed was close to inferior margin I did remove some more inferior margin. Hemostasis was observed.  I closed this with 2-0 Vicryl, 3-0 Vicryl and, 5-0 Monocryl.  Glue was placed.  She tolerated this well was extubated transferred recovery stable

## 2019-02-11 ENCOUNTER — Encounter: Payer: Self-pay | Admitting: *Deleted

## 2019-02-12 ENCOUNTER — Other Ambulatory Visit: Payer: Self-pay

## 2019-02-13 ENCOUNTER — Other Ambulatory Visit: Payer: Self-pay | Admitting: General Surgery

## 2019-02-13 DIAGNOSIS — C50211 Malignant neoplasm of upper-inner quadrant of right female breast: Secondary | ICD-10-CM

## 2019-02-16 ENCOUNTER — Ambulatory Visit
Admission: RE | Admit: 2019-02-16 | Discharge: 2019-02-16 | Disposition: A | Discharge status: Home / Self Care | Payer: PRIVATE HEALTH INSURANCE | Source: Ambulatory Visit | Attending: General Surgery | Admitting: General Surgery

## 2019-02-16 ENCOUNTER — Other Ambulatory Visit: Payer: Self-pay

## 2019-02-16 ENCOUNTER — Telehealth: Payer: Self-pay | Admitting: Hematology and Oncology

## 2019-02-16 DIAGNOSIS — C50211 Malignant neoplasm of upper-inner quadrant of right female breast: Secondary | ICD-10-CM

## 2019-02-16 MED ORDER — GADOBUTROL 1 MMOL/ML IV SOLN
7.0000 mL | Freq: Once | INTRAVENOUS | Status: AC | PRN
  Administered 2019-02-16: 7 mL via INTRAVENOUS

## 2019-02-16 NOTE — Telephone Encounter (Signed)
Received a new patient referral from Dr. [name] for a new dx of breast cancer. Ms. Terri Hoover has been cld and scheduled to see Dr. Lindi Adie on 12/24 at 1pm. Pt aware to arrive 15 minutes early.

## 2019-02-17 LAB — SURGICAL PATHOLOGY

## 2019-02-18 NOTE — Progress Notes (Signed)
[facility] CONSULT NOTE  Patient Care Team: Patient, No Pcp Per as PCP - General (General Practice)  CHIEF COMPLAINTS/PURPOSE OF CONSULTATION:  Newly diagnosed breast cancer  HISTORY OF PRESENTING ILLNESS:  Terri Hoover 43 y.o. female is here because of recent diagnosis of invasive ductal carcinoma of the right breast. The cancer was detected on a routine screening mammogram on 01/14/19. Diagnostic mammogram on 01/15/19 showed an area of distortion in the lower central right breast with a possible associated mass. 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 She underwent a lumpectomy with Dr. [name] on 02/10/19 for which pathology showed invasive ductal carcinoma, grade 1, 0.8cm, involved lateral margin, HER-2 equivocal (2+) by IHC, negative by FISH, ER+ 50%, PR+ 50%, Ki67 10%. She has a family history of breast cancer in a maternal aunt in her 73s, maternal grandmother at 21yo, and a maternal great aunt in her 63s. She presents to the clinic today for initial evaluation and discussion of treatment options.  Patient works in [employer]  I reviewed her records extensively and collaborated the history with the patient.  SUMMARY OF ONCOLOGIC HISTORY: Oncology History  Malignant neoplasm of lower-outer quadrant of right breast of female, estrogen receptor positive ([place])  01/15/2019 Initial Diagnosis   screening mammogram on 01/14/19. Diagnostic mammogram on 01/15/19 showed an area of distortion in the lower central right breast with a possible associated mass. 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 She underwent a lumpectomy with Dr. [name] on 02/10/19 for which pathology showed invasive ductal carcinoma, grade 1, 0.8cm, involved lateral margin, HER-2 equivocal (2+) by IHC, negative by FISH, ER+ 50%, PR+ 50%, Ki67 10%     MEDICAL HISTORY:  Past Medical History:  Diagnosis Date  . Anxiety   . ASCUS of  cervix with negative high risk HPV 09/2018  . Frequency of urination   . Hashimoto's disease   . History of TMJ disorder   . Hypothyroidism     SURGICAL HISTORY: Past Surgical History:  Procedure Laterality Date  . BREAST BIOPSY Right 2016   benign  . BREAST BIOPSY Right 2019  . COLPOSCOPY  2000  . CYSTO WITH HYDRODISTENSION N/A 05/06/2013   Procedure: CYSTOSCOPY/HYDRODISTENSION WITH TRIGGER POINT INJECTION ;  Surgeon: Alexis Frock, MD;  Location: [facility];  Service: Urology;  Laterality: N/A;  . DILATION AND CURETTAGE OF UTERUS  09-04-2002   W/  SUCTION FOR POSTPARTUM RETAINED PRODUCTS  . DX LAPAROSCOPY /  LASER ABLATION ENDOMETRIOSIS  2002  . RADIOACTIVE SEED GUIDED EXCISIONAL BREAST BIOPSY Right 02/10/2019   Procedure: RADIOACTIVE SEED GUIDED EXCISIONAL RIGHT BREAST BIOPSY;  Surgeon: Rolm Bookbinder, MD;  Location: [facility];  Service: General;  Laterality: Right;  . TEMPOROMANDIBULAR JOINT SURGERY  2000    SOCIAL HISTORY: Social History   Socioeconomic History  . Marital status: Married    Spouse name: Not on file  . Number of children: Not on file  . Years of education: Not on file  . Highest education level: Not on file  Occupational History  . Not on file  Tobacco Use  . Smoking status: Never Smoker  . Smokeless tobacco: Never Used  Substance and Sexual Activity  . Alcohol use: Yes    Alcohol/week: 0.0 standard drinks    Comment: OCCASIONALLY  . Drug use: No  . Sexual activity: Yes    Birth control/protection: Pill  Other Topics Concern  . Not on file  Social History Narrative  . Not on file   Social Determinants of Health   Financial Resource Strain:   . Difficulty of Paying Living Expenses: Not on file  Food Insecurity:   . Worried About Charity fundraiser in the Last Year: Not on file  . Ran Out of Food in the Last Year: Not on file  Transportation Needs:   . Lack of Transportation (Medical): Not on file  .  Lack of Transportation (Non-Medical): Not on file  Physical Activity:   . Days of Exercise per Week: Not on file  . Minutes of Exercise per Session: Not on file  Stress:   . Feeling of Stress : Not on file  Social Connections:   . Frequency of Communication with Friends and Family: Not on file  . Frequency of Social Gatherings with Friends and Family: Not on file  . Attends Religious Services: Not on file  . Active Member of Clubs or Organizations: Not on file  . Attends Archivist Meetings: Not on file  . Marital Status: Not on file  Intimate Partner Violence:   . Fear of Current or Ex-Partner: Not on file  . Emotionally Abused: Not on file  . Physically Abused: Not on file  . Sexually Abused: Not on file    FAMILY HISTORY: Family History  Problem Relation Age of Onset  . Heart disease Father   . Breast cancer Maternal Aunt 35  . Breast cancer Maternal Grandmother        Age 74    ALLERGIES:  has No Known Allergies.  MEDICATIONS:  Current Outpatient Medications  Medication Sig Dispense Refill  . ALPRAZolam (XANAX) 0.25 MG tablet Take 1 tablet (0.25 mg total) by mouth at bedtime as needed for anxiety. 30 tablet 2  . FLUoxetine (PROZAC) 40 MG capsule Take 1 capsule (40 mg total) by mouth daily. 90 capsule 3  . levothyroxine (SYNTHROID) 75 MCG tablet Take 75 mcg by mouth daily before breakfast.    . liothyronine (CYTOMEL) 5 MCG tablet Take 5 mcg by mouth daily.    . Multiple Vitamin (MULTIVITAMIN) tablet Take 1 tablet by mouth daily.    . norethindrone-ethinyl estradiol (JUNEL FE 1/20) 1-20 MG-MCG tablet Take 1 tablet by mouth once daily 84 tablet 4  . traMADol (ULTRAM) 50 MG tablet Take 1 tablet (50 mg total) by mouth every 6 (six) hours as needed. 10 tablet 0   No current facility-administered medications for this visit.    REVIEW OF SYSTEMS:   Constitutional: Denies fevers, chills or abnormal night sweats Eyes: Denies blurriness of vision, double vision or  watery eyes Ears, nose, mouth, throat, and face: Denies mucositis or sore throat Respiratory: Denies cough, dyspnea or wheezes Cardiovascular: Denies palpitation, chest discomfort or lower extremity swelling Gastrointestinal:  Denies nausea, heartburn or change in bowel habits Skin: Denies abnormal skin rashes Lymphatics: Denies new lymphadenopathy or easy bruising Neurological:Denies numbness, tingling or new weaknesses Behavioral/Psych: Mood is stable, no new changes  Breast: S/p right lumpectomy All other systems were reviewed with the patient and are negative.  PHYSICAL EXAMINATION: ECOG PERFORMANCE STATUS: 1 - Symptomatic but completely ambulatory  Vitals:   02/19/19 1303  BP: (!) 157/86  Pulse: 76  Resp: 18  Temp: 98.3 F (36.8 C)  SpO2: 100%   Filed Weights   02/19/19 1303  Weight: 149 lb 8 oz (67.8 kg)    GENERAL:alert, no distress and comfortable SKIN: skin color, texture, turgor are normal, no rashes or  significant lesions EYES: normal, conjunctiva are pink and non-injected, sclera clear OROPHARYNX:no exudate, no erythema and lips, buccal mucosa, and tongue normal  NECK: supple, thyroid normal size, non-tender, without nodularity LYMPH:  no palpable lymphadenopathy in the cervical, axillary or inguinal LUNGS: clear to auscultation and percussion with normal breathing effort HEART: regular rate & rhythm and no murmurs and no lower extremity edema ABDOMEN:abdomen soft, non-tender and normal bowel sounds Musculoskeletal:no cyanosis of digits and no clubbing  PSYCH: alert & oriented x 3 with fluent speech NEURO: no focal motor/sensory deficits  LABORATORY DATA:  I have reviewed the data as listed Lab Results  Component Value Date   WBC 6.8 10/14/2018   HGB 14.3 10/14/2018   HCT 42.4 10/14/2018   MCV 89.3 10/14/2018   PLT 229 10/14/2018   Lab Results  Component Value Date   NA 137 10/14/2018   K 4.7 10/14/2018   CL 104 10/14/2018   CO2 24 10/14/2018     RADIOGRAPHIC STUDIES: I have personally reviewed the radiological reports and agreed with the findings in the report.  ASSESSMENT AND PLAN:  Malignant neoplasm of lower-outer quadrant of right breast of female, estrogen receptor positive ([place]) 01/15/2019:screening mammogram detected area of distortion in the lower central right breast with a possible associated mass. 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 She underwent a lumpectomy with Dr. 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  Pathology and radiology counseling: Discussed with the patient, the details of pathology including the type of breast cancer,the clinical staging, the significance of ER, PR and HER-2/neu receptors and the implications for treatment. After reviewing the pathology in detail, we proceeded to discuss the different treatment options between including radiation, chemotherapy, antiestrogen therapies.  Treatment plan: Based on favorable grade and small tumor size, I do not recommend Oncotype DX testing. Because of involved lateral margin, patient will need reexcision of the margin. 1. Oncotype DX 2. Adjuvant radiation therapy followed by 3. Adjuvant antiestrogen therapy Genetic counseling will be ordered.  Patient is contemplating on doing bilateral mastectomies even if she does not have a BRCA gene because of the multiple biopsies over the years that she had to undergo.  She will discuss this with Dr. [name].  Return to clinic based on Oncotype DX test results  All questions were answered. The patient knows to call the clinic with any problems, questions or concerns.   Rulon Eisenmenger, MD, MPH 02/19/2019    I, Molly Dorshimer, am acting as scribe for Nicholas Lose,  MD.  I have reviewed the above documentation for accuracy and completeness, and I agree with the above.

## 2019-02-19 ENCOUNTER — Other Ambulatory Visit: Payer: Self-pay

## 2019-02-19 ENCOUNTER — Inpatient Hospital Stay: Payer: PRIVATE HEALTH INSURANCE | Attending: Hematology and Oncology | Admitting: Hematology and Oncology

## 2019-02-19 DIAGNOSIS — C50511 Malignant neoplasm of lower-outer quadrant of right female breast: Secondary | ICD-10-CM | POA: Diagnosis present

## 2019-02-19 DIAGNOSIS — Z170 Estrogen receptor positive status [ER+]: Secondary | ICD-10-CM | POA: Diagnosis not present

## 2019-02-19 NOTE — Assessment & Plan Note (Signed)
01/15/2019:screening mammogram detected area of distortion in the lower central right breast with a possible associated mass. 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 She underwent a lumpectomy with Dr. 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 After reviewing the pathology in detail, we proceeded to discuss the different treatment options between including radiation, chemotherapy, antiestrogen therapies.  Treatment plan: Based on favorable grade and small tumor size, I do not recommend Oncotype DX testing. Because of involved lateral margin, patient will need reexcision of the margin. 1.  Adjuvant radiation therapy followed by 2.  Adjuvant antiestrogen therapy  Return to clinic at the end of radiation therapy.

## 2019-02-23 ENCOUNTER — Telehealth: Payer: Self-pay | Admitting: Hematology and Oncology

## 2019-02-23 ENCOUNTER — Encounter: Payer: Self-pay | Admitting: *Deleted

## 2019-02-23 NOTE — Telephone Encounter (Signed)
Scheduled appt per 12/28 sch message - unable to reach pt . Left message with appt date and time

## 2019-02-26 ENCOUNTER — Other Ambulatory Visit: Payer: Self-pay | Admitting: General Surgery

## 2019-02-27 DIAGNOSIS — Z923 Personal history of irradiation: Secondary | ICD-10-CM

## 2019-02-27 DIAGNOSIS — Z9221 Personal history of antineoplastic chemotherapy: Secondary | ICD-10-CM

## 2019-02-27 HISTORY — DX: Personal history of irradiation: Z92.3

## 2019-02-27 HISTORY — DX: Personal history of antineoplastic chemotherapy: Z92.21

## 2019-03-03 ENCOUNTER — Inpatient Hospital Stay: Payer: Managed Care, Other (non HMO)

## 2019-03-03 ENCOUNTER — Telehealth: Payer: Self-pay | Admitting: *Deleted

## 2019-03-03 ENCOUNTER — Other Ambulatory Visit: Payer: Self-pay

## 2019-03-03 ENCOUNTER — Inpatient Hospital Stay: Payer: Managed Care, Other (non HMO) | Attending: Genetic Counselor | Admitting: Genetic Counselor

## 2019-03-03 ENCOUNTER — Encounter: Payer: Self-pay | Admitting: Genetic Counselor

## 2019-03-03 DIAGNOSIS — C50511 Malignant neoplasm of lower-outer quadrant of right female breast: Secondary | ICD-10-CM | POA: Insufficient documentation

## 2019-03-03 DIAGNOSIS — Z8041 Family history of malignant neoplasm of ovary: Secondary | ICD-10-CM | POA: Insufficient documentation

## 2019-03-03 DIAGNOSIS — Z801 Family history of malignant neoplasm of trachea, bronchus and lung: Secondary | ICD-10-CM | POA: Diagnosis not present

## 2019-03-03 DIAGNOSIS — Z800 Family history of malignant neoplasm of digestive organs: Secondary | ICD-10-CM | POA: Insufficient documentation

## 2019-03-03 DIAGNOSIS — Z79899 Other long term (current) drug therapy: Secondary | ICD-10-CM | POA: Insufficient documentation

## 2019-03-03 DIAGNOSIS — Z170 Estrogen receptor positive status [ER+]: Secondary | ICD-10-CM | POA: Insufficient documentation

## 2019-03-03 DIAGNOSIS — Z808 Family history of malignant neoplasm of other organs or systems: Secondary | ICD-10-CM | POA: Insufficient documentation

## 2019-03-03 NOTE — Telephone Encounter (Signed)
Pt called requesting update on oncotype testing. Informed pt that it has been sent out and takes approximately 2wks to return. Received verbal understanding. Denies further needs at this time.

## 2019-03-03 NOTE — Progress Notes (Signed)
REFERRING PROVIDER: Rolm Bookbinder, MD [address],  [place] [address]  PRIMARY PROVIDER:  Patient, No Pcp Per  PRIMARY REASON FOR VISIT:  1. Malignant neoplasm of lower-outer quadrant of right breast of female, estrogen receptor positive ([place])   2. Family history of thyroid cancer   3. Family history of melanoma   4. Family history of esophageal cancer   5. Family history of lung cancer   6. Family history of ovarian cancer      I connected with Terri Hoover on 03/03/2019 at 2:00 pm EDT by MyChart video conference and verified that I am speaking with the correct person using two identifiers.   Patient location: home Provider location: office  HISTORY OF PRESENT ILLNESS:   Terri Hoover, a 44 y.o. female, was seen for a [facility] cancer genetics consultation at the request of Dr. [name] due to a personal and family history of breast cancer.  Terri Hoover presents to clinic today to discuss the possibility of a hereditary predisposition to cancer, genetic testing, and to further clarify her future cancer risks, as well as potential cancer risks for family members.   In 2020, at the age of 56, Terri Hoover was diagnosed with IDC, ER+/PR+/Her2-, of the right breast.   CANCER HISTORY:  Oncology History  Malignant neoplasm of lower-outer quadrant of right breast of female, estrogen receptor positive ([place])  01/15/2019 Initial Diagnosis   screening mammogram on 01/14/19. Diagnostic mammogram on 01/15/19 showed an area of distortion in the lower central right breast with a possible associated mass. 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 She underwent a lumpectomy with Dr. 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  First live birth at age 75.  OCP  use for approximately 13 years.  Ovaries intact: yes.  Hysterectomy: no.  Menopausal status: premenopausal.  HRT use: 0 years. History of fertility treatment when she was 25 (Clomid) Colonoscopy: no. Mammogram within the last year: yes. Number of breast biopsies: 4. Any excessive radiation exposure in the past: no  Past Medical History:  Diagnosis Date  . Anxiety   . ASCUS of cervix with negative high risk HPV 09/2018  . Family history of breast cancer   . Family history of esophageal cancer   . Family history of lung cancer   . Family history of melanoma   . Family history of ovarian cancer   . Family history of thyroid cancer   . Frequency of urination   . Hashimoto's disease   . History of TMJ disorder   . Hypothyroidism     Past Surgical History:  Procedure Laterality Date  . BREAST BIOPSY Right 2016   benign  . BREAST BIOPSY Right 2019  . COLPOSCOPY  2000  . CYSTO WITH HYDRODISTENSION N/A 05/06/2013   Procedure: CYSTOSCOPY/HYDRODISTENSION WITH TRIGGER POINT INJECTION ;  Surgeon: Alexis Frock, MD;  Location: [facility];  Service: Urology;  Laterality: N/A;  . DILATION AND CURETTAGE OF UTERUS  09-04-2002   W/  SUCTION FOR POSTPARTUM RETAINED PRODUCTS  . DX LAPAROSCOPY /  LASER ABLATION ENDOMETRIOSIS  2002  . RADIOACTIVE SEED GUIDED EXCISIONAL BREAST BIOPSY Right 02/10/2019   Procedure: RADIOACTIVE SEED GUIDED EXCISIONAL RIGHT BREAST BIOPSY;  Surgeon: Rolm Bookbinder, MD;  Location: [facility];  Service: General;  Laterality: Right;  . TEMPOROMANDIBULAR JOINT SURGERY  2000    Social History   Socioeconomic History  . Marital status: Married    Spouse name: Not on file  . Number of children: Not on file  . Years of education: Not on file  . Highest education level: Not on file  Occupational History  . Not on file  Tobacco Use  . Smoking status: Never Smoker  . Smokeless tobacco: Never Used  Substance and Sexual Activity  .  Alcohol use: Yes    Alcohol/week: 0.0 standard drinks    Comment: OCCASIONALLY  . Drug use: No  . Sexual activity: Yes    Birth control/protection: Pill  Other Topics Concern  . Not on file  Social History Narrative  . Not on file   Social Determinants of Health   Financial Resource Strain:   . Difficulty of Paying Living Expenses: Not on file  Food Insecurity:   . Worried About Charity fundraiser in the Last Year: Not on file  . Ran Out of Food in the Last Year: Not on file  Transportation Needs:   . Lack of Transportation (Medical): Not on file  . Lack of Transportation (Non-Medical): Not on file  Physical Activity:   . Days of Exercise per Week: Not on file  . Minutes of Exercise per Session: Not on file  Stress:   . Feeling of Stress : Not on file  Social Connections:   . Frequency of Communication with Friends and Family: Not on file  . Frequency of Social Gatherings with Friends and Family: Not on file  . Attends Religious Services: Not on file  . Active Member of Clubs or Organizations: Not on file  . Attends Archivist Meetings: Not on file  . Marital Status: Not on file     FAMILY HISTORY:  We obtained a detailed, 4-generation family history.  Significant diagnoses are listed below: Family History  Problem Relation Age of Onset  . Heart disease Father   . Breast cancer Maternal Aunt 35  . Breast cancer Maternal Grandmother 85       recurred at age 43  . Esophageal cancer Paternal Uncle 48  . Thyroid cancer Maternal Grandfather        dx. in his 80s  . Melanoma Paternal Grandmother        dx. in her 66s  . Lung cancer Paternal Grandfather        dx. in his 24s  . Bladder Cancer Paternal Grandfather 44  . Breast cancer Other 15       maternal grandmother's sister  . Breast cancer Other 50       paternal 4th degree relative  . Liver cancer Other 55  . Ovarian cancer Other 37       paternal 5th degree relative   Terri Hoover has two daughters,  ages 58 and 73. She has one brother who is 49 and has not had cancer.  Terri Hoover mother is currently 67 and does not have a history of cancer. She has one maternal aunt who is currently 67 and was diagnosed with breast cancer at age 27. Terri Hoover does not have any maternal first cousins. Her maternal grandmother was diagnosed with breast cancer at age 69, and then diagnosed with breast cancer again at age 33 before she died at age 44. Terri Hoover has a maternal great-aunt (grandmother's sister) who also had  breast cancer diagnosed when she was 1. Her maternal grandfather died when he was 32 and had a history of thyroid cancer in his 9s. There are no other known diagnoses of cancer on the maternal side of the family.  Terri Hoover father is currently 76 and does not have a history of cancer. She has one paternal uncle who recently died at age 81 of esophageal cancer. She does not have any paternal first cousins. Her paternal grandmother died at age 65 and had a history of melanoma diagnosed in her 60s, and her paternal grandfather had a history of lung cancer diagnosed in his 72s and bladder cancer diagnosed at age 51 before he died at age 37. Of note, her grandfather was a smoker. Terri Hoover paternal grandfather had a niece who had breast cancer at age 48 and liver cancer at age 44, and a great-niece who had ovarian cancer diagnosed at age 82. There are no other known diagnoses of cancer on the paternal side of the family.  Terri Hoover is unaware of previous family history of genetic testing for hereditary cancer risks. Her ancestors are of unknown descent. There is no reported Ashkenazi Jewish ancestry. There is no known consanguinity.  GENETIC COUNSELING ASSESSMENT: Terri Hoover is a 44 y.o. female with a personal and family history of breast cancer which is somewhat suggestive of a hereditary cancer syndrome and predisposition to cancer. We, therefore, discussed and recommended the following at  today's visit.   DISCUSSION: We discussed that 5 - 10% of breast cancer is hereditary, with most cases associated with the BRCA genes.  There are other genes that can be associated with hereditary breast cancer syndromes.  These include ATM, CHEK2, PALB2, etc.  We discussed that testing is beneficial for several reasons including knowing about other cancer risks, identifying potential screening and risk-reduction options that may be appropriate, and to understand if other family members could be at risk for cancer and allow them to undergo genetic testing.   We reviewed the characteristics, features and inheritance patterns of hereditary cancer syndromes. We also discussed genetic testing, including the appropriate family members to test, the process of testing, insurance coverage and turn-around-time for results. We discussed the implications of a negative, positive and/or variant of uncertain significant result. In order to get genetic test results in a timely manner so that Terri Hoover can use these genetic test results for surgical decisions, we recommended Terri Hoover pursue genetic testing for the [facility] Breast Cancer STAT panel. Once complete, we recommend Terri Hoover pursue reflex genetic testing to the Common Hereditary Cancers panle.   The STAT Breast cancer panel offered by [facility] includes sequencing and rearrangement analysis for the following 9 genes:  ATM, BRCA1, BRCA2, CDH1, CHEK2, PALB2, PTEN, STK11 and TP53.     The Common Hereditary Cancers Panel offered by [facility] includes sequencing and/or deletion duplication testing of the following 48 genes: APC, ATM, AXIN2, BARD1, BMPR1A, BRCA1, BRCA2, BRIP1, CDH1, CDK4, CDKN2A (p14ARF), CDKN2A (p16INK4a), CHEK2, CTNNA1, DICER1, EPCAM (Deletion/duplication testing only), GREM1 (promoter region deletion/duplication testing only), KIT, MEN1, MLH1, MSH2, MSH3, MSH6, MUTYH, NBN, NF1, NHTL1, PALB2, PDGFRA, PMS2, POLD1, POLE, PTEN, RAD50, RAD51C, RAD51D,  RNF43, SDHB, SDHC, SDHD, SMAD4, SMARCA4. STK11, TP53, TSC1, TSC2, and VHL.  The following genes are evaluated for sequence changes only: SDHA and HOXB13 c.251G>A variant only.   Based on Terri Hoover's personal and family history of cancer, she meets medical criteria for genetic testing. Despite that she meets criteria, she may  still have an out of pocket cost.   PLAN: After considering the risks, benefits, and limitations, Terri Hoover provided informed consent to pursue genetic testing and the blood sample was sent to [facility] for analysis of the Breast Cancer STAT panel + Common Hereditary Cancers panel. Results should be available within approximately one-two weeks' time, at which point they will be disclosed by telephone to Terri Hoover, as will any additional recommendations warranted by these results. Terri Hoover will receive a summary of her genetic counseling visit and a copy of her results once available. This information will also be available in Epic.   Ms. Mendel questions were answered to her satisfaction today. Our contact information was provided should additional questions or concerns arise. Thank you for the referral and allowing [place] to share in the care of your patient.   Clint Guy, MS, [facility] Certified Genetic Counselor [email or web address] Phone: [phone]  The patient was seen for a total of 40 minutes in face-to-face genetic counseling.  This patient was discussed with Drs. Magrinat, Lindi Adie and/or Burr Medico who agrees with the above.    _______________________________________________________________________ For Office Staff:  Number of people involved in session: 1 Was an Intern/ student involved with case: no

## 2019-03-04 ENCOUNTER — Telehealth: Payer: Self-pay | Admitting: *Deleted

## 2019-03-04 NOTE — Telephone Encounter (Signed)
Received oncotype score of 24. Physician team notified. Called pt with results. Scheduled and confirmed appt with Dr. Lindi Adie on 1/8 at 12pm.

## 2019-03-06 ENCOUNTER — Telehealth: Payer: Self-pay | Admitting: *Deleted

## 2019-03-06 ENCOUNTER — Encounter (HOSPITAL_COMMUNITY): Payer: Self-pay | Admitting: Hematology and Oncology

## 2019-03-06 ENCOUNTER — Inpatient Hospital Stay (HOSPITAL_BASED_OUTPATIENT_CLINIC_OR_DEPARTMENT_OTHER): Payer: Managed Care, Other (non HMO) | Admitting: Hematology and Oncology

## 2019-03-06 ENCOUNTER — Inpatient Hospital Stay: Payer: Managed Care, Other (non HMO) | Admitting: Hematology and Oncology

## 2019-03-06 ENCOUNTER — Other Ambulatory Visit: Payer: Self-pay

## 2019-03-06 ENCOUNTER — Other Ambulatory Visit: Payer: Self-pay | Admitting: General Surgery

## 2019-03-06 DIAGNOSIS — C50211 Malignant neoplasm of upper-inner quadrant of right female breast: Secondary | ICD-10-CM

## 2019-03-06 DIAGNOSIS — C50511 Malignant neoplasm of lower-outer quadrant of right female breast: Secondary | ICD-10-CM

## 2019-03-06 DIAGNOSIS — Z79899 Other long term (current) drug therapy: Secondary | ICD-10-CM | POA: Diagnosis not present

## 2019-03-06 DIAGNOSIS — Z170 Estrogen receptor positive status [ER+]: Secondary | ICD-10-CM

## 2019-03-06 NOTE — Assessment & Plan Note (Signed)
01/15/2019:screening mammogram detected area of distortion in the lower central right breast with a possible associated mass. 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 She underwent a lumpectomy with Dr. 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  Adjuvant chemotherapy with Taxotere and Cytoxan every 3 weeks x4 cycles 2.  Adjuvant radiation therapy followed by 3.  Adjuvant antiestrogen therapy  Chemotherapy Counseling: I discussed the risks and benefits of chemotherapy including the risks of nausea/ vomiting, risk of infection from low WBC count, fatigue due to chemo or anemia, bruising or bleeding due to low platelets, mouth sores, loss/ change in taste and decreased appetite. Liver and kidney function will be monitored through out chemotherapy as abnormalities in liver and kidney function may be a side effect of treatment.  Risk of neuropathy and permanent bone marrow dysfunction and leukemia due to chemo were also discussed.  Plan: Port placement, chemo class  Return to clinic to start chemotherapy in 2 weeks.

## 2019-03-06 NOTE — Telephone Encounter (Signed)
error 

## 2019-03-06 NOTE — Assessment & Plan Note (Deleted)
01/15/2019:screening mammogram detected area of distortion in the lower central right breast with a possible associated mass. 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 She underwent a lumpectomy with Dr. 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 ---------------------------------------------------------------------------------------------------------------------------------------------------------------- Oncotype DX counseling: Given the data from tailor Rx, patients younger than age 56 with Oncotype score greater than 20 are recommended to receive chemotherapy.  Treatment plan: 1.  Taxotere and Cytoxan every 3 weeks x4 cycles 2.  Adjuvant radiation therapy followed by 3.  Adjuvant antiestrogen therapy  Chemotherapy Counseling: I discussed the risks and benefits of chemotherapy including the risks of nausea/ vomiting, risk of infection from low WBC count, fatigue due to chemo or anemia, bruising or bleeding due to low platelets, mouth sores, loss/ change in taste and decreased appetite. Liver and kidney function will be monitored through out chemotherapy as abnormalities in liver and kidney function may be a side effect of treatment.  Neuropathy risk was discussed in detail . Risk of permanent bone marrow dysfunction and leukemia due to chemo were also discussed.  Return to clinic after port placement to start chemo   Patient is contemplating on doing bilateral mastectomies.

## 2019-03-06 NOTE — Progress Notes (Signed)
Patient Care Team: Patient, No Pcp Per as PCP - General (General Practice) Mauro Kaufmann, RN as Oncology Nurse Navigator Rockwell Germany, RN as Oncology Nurse Navigator  DIAGNOSIS:  Encounter Diagnosis  Name Primary?   Malignant neoplasm of lower-outer quadrant of right breast of female, estrogen receptor positive ([place])     SUMMARY OF ONCOLOGIC HISTORY: Oncology History  Malignant neoplasm of lower-outer quadrant of right breast of female, estrogen receptor positive ([place])  01/15/2019 Initial Diagnosis   Screening mammogram detected an area of distortion in the lower central right breast with a possible associated mass. 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 Lumpectomy showed IDC.    02/10/2019 Surgery   Right lumpectomy [name]): IDC, grade 1, 0.8cm, involved lateral margin, HER-2 equivocal (2+) by IHC, negative by FISH, ER+ 50%, PR+ 50%, Ki67 10%   03/04/2019 Oncotype testing   Score of 24     CHIEF COMPLIANT: Follow-up to discuss Oncotype score  INTERVAL HISTORY: Terri Hoover is a 44 year old above-mentioned history of right breast cancer who underwent a right lumpectomy and underwent Oncotype DX testing and she is here today to discuss the results.  Unfortunately the results showed an Oncotype recurrence score 24 which translates into a 10% distant recurrence at 9 years.  Based upon tailor Rx data, she is here today to discuss the benefits of chemotherapy. She is awaiting the results of genetic testing to determine the best surgical option.  Mastectomy versus lumpectomy plus sentinel lymph node surgery   ALLERGIES:  has No Known Allergies.  MEDICATIONS:  Current Outpatient Medications  Medication Sig Dispense Refill   ALPRAZolam (XANAX) 0.25 MG tablet Take 1 tablet (0.25 mg total) by mouth at bedtime as needed for anxiety. 30 tablet 2   FLUoxetine (PROZAC) 40 MG capsule Take 1 capsule (40 mg total) by mouth daily. 90 capsule 3    levothyroxine (SYNTHROID) 75 MCG tablet Take 75 mcg by mouth daily before breakfast.     liothyronine (CYTOMEL) 5 MCG tablet Take 5 mcg by mouth daily.     Multiple Vitamin (MULTIVITAMIN) tablet Take 1 tablet by mouth daily.     norethindrone-ethinyl estradiol (JUNEL FE 1/20) 1-20 MG-MCG tablet Take 1 tablet by mouth once daily 84 tablet 4   traMADol (ULTRAM) 50 MG tablet Take 1 tablet (50 mg total) by mouth every 6 (six) hours as needed. 10 tablet 0   No current facility-administered medications for this visit.    PHYSICAL EXAMINATION: ECOG PERFORMANCE STATUS: 0 - Asymptomatic  Vitals:   03/06/19 1100  BP: 124/78  Pulse: 66  Resp: 20  Temp: 97.8 F (36.6 C)  SpO2: 100%   Filed Weights   03/06/19 1100  Weight: 144 lb 14.4 oz (65.7 kg)      LABORATORY DATA:  I have reviewed the data as listed CMP Latest Ref Rng & Units 10/14/2018 12/17/2014 12/02/2013  Glucose 65 - 99 mg/dL 87 71 76  BUN 7 - 25 mg/dL 18 13 16   Creatinine 0.50 - 1.10 mg/dL 0.81 0.68 0.89  Sodium 135 - 146 mmol/L 137 139 138  Potassium 3.5 - 5.3 mmol/L 4.7 4.4 4.3  Chloride 98 - 110 mmol/L 104 107 105  CO2 20 - 32 mmol/L 24 25 25   Calcium 8.6 - 10.2 mg/dL 9.4 8.9 9.1  Total Protein 6.1 - 8.1 g/dL 6.6 6.4 6.9  Total Bilirubin 0.2 - 1.2 mg/dL 0.5 1.3(H) 0.9  Alkaline Phos 33 - 115 U/L - 40 47  AST 10 - 30 U/L 17 13 16   ALT 6 - 29 U/L 15 9 11     Lab Results  Component Value Date   WBC 6.8 10/14/2018   HGB 14.3 10/14/2018   HCT 42.4 10/14/2018   MCV 89.3 10/14/2018   PLT 229 10/14/2018   NEUTROABS 4,869 10/14/2018    ASSESSMENT & PLAN:  Malignant neoplasm of lower-outer quadrant of right breast of female, estrogen receptor positive ([place]) 01/15/2019:screening mammogram detected area of distortion in the lower central right breast with a possible associated mass. 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 She underwent a lumpectomy with Dr. 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  Adjuvant chemotherapy with Taxotere and Cytoxan every 3 weeks x4 cycles 2.  Adjuvant radiation therapy if she undergoes breast conserving surgery followed by 3.  Adjuvant antiestrogen therapy  Chemotherapy Counseling: I discussed the risks and benefits of chemotherapy including the risks of nausea/ vomiting, risk of infection from low WBC count, fatigue due to chemo or anemia, bruising or bleeding due to low platelets, mouth sores, loss/ change in taste and decreased appetite. Liver and kidney function will be monitored through out chemotherapy as abnormalities in liver and kidney function may be a side effect of treatment.  Risk of neuropathy and permanent bone marrow dysfunction and leukemia due to chemo were also discussed. We discussed that the benefit of chemotherapy would be to reduce the risk of distant recurrence from 10% to 5%.  Plan: Port placement, chemo class  Return to clinic to start chemotherapy 4 weeks after surgery.     No orders of the defined types were placed in this encounter.  The patient has a good understanding of the overall plan. she agrees with it. she will call with any problems that may develop before the next visit here. Total time spent: 30 mins including face to face time and time spent for planning, charting and co-ordination of care   [name], MD 03/06/19

## 2019-03-09 ENCOUNTER — Telehealth: Payer: Self-pay | Admitting: Genetic Counselor

## 2019-03-09 ENCOUNTER — Other Ambulatory Visit: Payer: Self-pay | Admitting: Hematology and Oncology

## 2019-03-09 ENCOUNTER — Encounter: Payer: Self-pay | Admitting: *Deleted

## 2019-03-09 DIAGNOSIS — C50511 Malignant neoplasm of lower-outer quadrant of right female breast: Secondary | ICD-10-CM

## 2019-03-09 MED ORDER — DEXAMETHASONE 4 MG PO TABS: 4.0000 mg | ORAL_TABLET | Freq: Two times a day (BID) | ORAL | 0 refills | Status: DC

## 2019-03-09 MED ORDER — PROCHLORPERAZINE MALEATE 10 MG PO TABS: 10.0000 mg | ORAL_TABLET | Freq: Four times a day (QID) | ORAL | 1 refills | Status: DC | PRN

## 2019-03-09 MED ORDER — LORAZEPAM 0.5 MG PO TABS: 0.5000 mg | ORAL_TABLET | Freq: Every evening | ORAL | 0 refills | Status: DC | PRN

## 2019-03-09 MED ORDER — ONDANSETRON HCL 8 MG PO TABS: 8.0000 mg | ORAL_TABLET | Freq: Two times a day (BID) | ORAL | 1 refills | Status: DC | PRN

## 2019-03-09 MED ORDER — LIDOCAINE-PRILOCAINE 2.5-2.5 % EX CREA: TOPICAL_CREAM | CUTANEOUS | 3 refills | Status: DC

## 2019-03-09 NOTE — Progress Notes (Signed)
START ON PATHWAY REGIMEN - Breast     A cycle is every 21 days:     Docetaxel      Cyclophosphamide   **Always confirm dose/schedule in your pharmacy ordering system**  Patient Characteristics: Postoperative without Neoadjuvant Therapy (Pathologic Staging), Invasive Disease, Adjuvant Therapy, HER2 Negative/Unknown/Equivocal, ER Positive, Node Negative, pT1a-c, pN0/N3m or pT2 or Higher, pN0, Oncotype Intermediate Risk (11 - 25), Chemotherapy  Preferred Therapeutic Status: Postoperative without Neoadjuvant Therapy (Pathologic Staging) AJCC Grade: G1 AJCC N Category: pN0 AJCC M Category: cM0 ER Status: Positive (+) AJCC 8 Stage Grouping: IA HER2 Status: Negative (-) Oncotype Dx Recurrence Score: 24 AJCC T Category: pT1b PR Status: Positive (+) Has this patient completed genomic testing<= Yes - Oncotype DX(R) Treatment Preferred: Chemotherapy Intent of Therapy: Curative Intent, Discussed with Patient

## 2019-03-09 NOTE — Telephone Encounter (Signed)
LVM that her genetic test results are available and requested that she call back to discuss them.  

## 2019-03-10 ENCOUNTER — Encounter (HOSPITAL_BASED_OUTPATIENT_CLINIC_OR_DEPARTMENT_OTHER): Payer: Self-pay | Admitting: General Surgery

## 2019-03-10 ENCOUNTER — Other Ambulatory Visit: Payer: Self-pay | Admitting: *Deleted

## 2019-03-10 ENCOUNTER — Telehealth: Payer: Self-pay | Admitting: Hematology and Oncology

## 2019-03-10 ENCOUNTER — Ambulatory Visit: Payer: Self-pay | Admitting: Genetic Counselor

## 2019-03-10 ENCOUNTER — Encounter: Payer: Self-pay | Admitting: Genetic Counselor

## 2019-03-10 ENCOUNTER — Other Ambulatory Visit: Payer: Self-pay

## 2019-03-10 DIAGNOSIS — C50511 Malignant neoplasm of lower-outer quadrant of right female breast: Secondary | ICD-10-CM

## 2019-03-10 DIAGNOSIS — Z1379 Encounter for other screening for genetic and chromosomal anomalies: Secondary | ICD-10-CM

## 2019-03-10 DIAGNOSIS — Z170 Estrogen receptor positive status [ER+]: Secondary | ICD-10-CM

## 2019-03-10 MED ORDER — DEXAMETHASONE 4 MG PO TABS: 4.0000 mg | ORAL_TABLET | Freq: Two times a day (BID) | ORAL | 0 refills | Status: DC

## 2019-03-10 NOTE — Telephone Encounter (Signed)
Revealed negative genetic testing.  Discussed that we do not know why she has breast cancer or why there is cancer in the family.  It could be due to a different gene that we are not testing, or our current technology may not be able detect something.  It will be important for her to keep in contact with genetics to keep up with whether additional testing may be appropriate in the future.

## 2019-03-10 NOTE — Progress Notes (Signed)
HPI:  Terri Hoover was previously seen in the [facility] due to a personal and family history of breast cancer and concerns regarding a hereditary predisposition to cancer. Please refer to our prior cancer genetics clinic note for more information regarding our discussion, assessment and recommendations, at the time. Terri Hoover recent genetic test results were disclosed to her, as were recommendations warranted by these results. These results and recommendations are discussed in more detail below.  CANCER HISTORY:  Oncology History  Malignant neoplasm of lower-outer quadrant of right breast of female, estrogen receptor positive ([place])  01/15/2019 Initial Diagnosis   Screening mammogram detected an area of distortion in the lower central right breast with a possible associated mass. 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 Lumpectomy showed IDC.    02/10/2019 Surgery   Right lumpectomy [name]): IDC, grade 1, 0.8cm, involved lateral margin, HER-2 equivocal (2+) by IHC, negative by FISH, ER+ 50%, PR+ 50%, Ki67 10%   03/04/2019 Oncotype testing   Score of 24   03/09/2019 Genetic Testing   Negative genetic testing:  No pathogenic variants detected on the [facility] Breast Cancer STAT panel or the Common Hereditary Cancers panel. The report date is 03/09/19.  The Breast Cancer STAT panel offered by [facility] includes sequencing and rearrangement analysis for the following 9 genes:  ATM, BRCA1, BRCA2, CDH1, CHEK2, PALB2, PTEN, STK11 and TP53.  The Common Hereditary Cancers Panel offered by [facility] includes sequencing and/or deletion duplication testing of the following 48 genes: APC, ATM, AXIN2, BARD1, BMPR1A, BRCA1, BRCA2, BRIP1, CDH1, CDK4, CDKN2A (p14ARF), CDKN2A (p16INK4a), CHEK2, CTNNA1, DICER1, EPCAM (Deletion/duplication testing only), GREM1 (promoter region deletion/duplication testing only), KIT, MEN1, MLH1, MSH2, MSH3, MSH6, MUTYH, NBN, NF1, NHTL1, PALB2,  PDGFRA, PMS2, POLD1, POLE, PTEN, RAD50, RAD51C, RAD51D, RNF43, SDHB, SDHC, SDHD, SMAD4, SMARCA4. STK11, TP53, TSC1, TSC2, and VHL.  The following genes were evaluated for sequence changes only: SDHA and HOXB13 c.251G>A variant only.    04/14/2019 -  Chemotherapy   The patient had palonosetron (ALOXI) injection 0.25 mg, 0.25 mg, Intravenous,  Once, 0 of 4 cycles pegfilgrastim-jmdb (FULPHILA) injection 6 mg, 6 mg, Subcutaneous,  Once, 0 of 4 cycles cyclophosphamide (CYTOXAN) 1,040 mg in sodium chloride 0.9 % 250 mL chemo infusion, 600 mg/m2 = 1,040 mg, Intravenous,  Once, 0 of 4 cycles DOCEtaxel (TAXOTERE) 130 mg in sodium chloride 0.9 % 250 mL chemo infusion, 75 mg/m2 = 130 mg, Intravenous,  Once, 0 of 4 cycles  for chemotherapy treatment.      FAMILY HISTORY:  We obtained a detailed, 4-generation family history.  Significant diagnoses are listed below: Family History  Problem Relation Age of Onset  . Heart disease Father   . Breast cancer Maternal Aunt 35  . Breast cancer Maternal Grandmother 85       recurred at age 52  . Esophageal cancer Paternal Uncle 33  . Thyroid cancer Maternal Grandfather        dx. in his 50s  . Melanoma Paternal Grandmother        dx. in her 65s  . Lung cancer Paternal Grandfather        dx. in his 90s  . Bladder Cancer Paternal Grandfather 6  . Breast cancer Other 61       maternal grandmother's sister  . Breast cancer Other 50       paternal 4th degree relative  . Liver cancer Other 55  . Ovarian cancer Other 37  paternal 5th degree relative     Terri Hoover has two daughters, ages 91 and 64. She has one brother who is 45 and has not had cancer.  Terri Hoover mother is currently 36 and does not have a history of cancer. She has one maternal aunt who is currently 27 and was diagnosed with breast cancer at age 73. Terri Hoover does not have any maternal first cousins. Her maternal grandmother was diagnosed with breast cancer at age 80, and then  diagnosed with breast cancer again at age 25 before she died at age 6. Terri Hoover has a maternal great-aunt (grandmother's sister) who also had breast cancer diagnosed when she was 54. Her maternal grandfather died when he was 40 and had a history of thyroid cancer in his 79s. There are no other known diagnoses of cancer on the maternal side of the family.  Terri Hoover father is currently 62 and does not have a history of cancer. She has one paternal uncle who recently died at age 60 of esophageal cancer. She does not have any paternal first cousins. Her paternal grandmother died at age 37 and had a history of melanoma diagnosed in her 57s, and her paternal grandfather had a history of lung cancer diagnosed in his 101s and bladder cancer diagnosed at age 100 before he died at age 58. Of note, her grandfather was a smoker. Ms. Kilian paternal grandfather had a niece who had breast cancer at age 2 and liver cancer at age 15, and a great-niece who had ovarian cancer diagnosed at age 61. There are no other known diagnoses of cancer on the paternal side of the family.  Terri Hoover is unaware of previous family history of genetic testing for hereditary cancer risks. Her ancestors are of unknown descent. There is no reported Ashkenazi Jewish ancestry. There is no known consanguinity.  GENETIC TEST RESULTS: Genetic testing reported out on 03/09/19 through the [facility] Breast Cancer STAT and Common Hereditary Cancer panels. No pathogenic variants were detected.   The STAT Breast cancer panel offered by [facility] includes sequencing and rearrangement analysis for the following 9 genes:  ATM, BRCA1, BRCA2, CDH1, CHEK2, PALB2, PTEN, STK11 and TP53.   . The test report will be scanned into EPIC and located under the Molecular Pathology section of the Results Review tab.  A portion of the result report is included below for reference.     We discussed with Terri Hoover that because current genetic testing is not  perfect, it is possible there may be a gene mutation in one of these genes that current testing cannot detect, but that chance is small.  We also discussed, that there could be another gene that has not yet been discovered, or that we have not yet tested, that is responsible for the cancer diagnoses in the family. It is also possible there is a hereditary cause for the cancer in the family that Terri Hoover did not inherit and therefore was not identified in her testing.  Therefore, it is important to remain in touch with cancer genetics in the future so that we can continue to offer Terri Hoover the most up to date genetic testing.   CANCER SCREENING RECOMMENDATIONS: Terri Hoover test result is considered negative (normal).  This means that we have not identified a hereditary cause for her personal and family history of cancer at this time.   Given Terri Hoover's personal and family histories, we must interpret these negative results with some caution.  Families  with features suggestive of hereditary risk for cancer tend to have multiple family members with cancer, diagnoses in multiple generations and diagnoses before the age of 59. Terri Hoover family exhibits some of these features. Thus, this result may simply reflect our current inability to detect all mutations within these genes or there may be a different gene that has not yet been discovered or tested.   An individual's cancer risk and medical management are not determined by genetic test results alone. Overall cancer risk assessment incorporates additional factors, including personal medical history, family history, and any available genetic information that may result in a personalized plan for cancer prevention and surveillance.  RECOMMENDATIONS FOR FAMILY MEMBERS:  Individuals in this family might be at some increased risk of developing cancer, over the general population risk, simply due to the family history of cancer.  We recommended women in  this family have a yearly mammogram beginning at age 81, or 74 years younger than the earliest onset of cancer, an annual clinical breast exam, and perform monthly breast self-exams. Women in this family should also have a gynecological exam as recommended by their primary provider. All family members should have a colonoscopy by age 62.  It is also possible there is a hereditary cause for the cancer in Terri Hoover's family that she did not inherit and therefore was not identified in her.  Based on Terri Hoover's family history, we recommended her maternal aunt, who was diagnosed with breast cancer at age 22, have genetic counseling and testing. Terri Hoover will let us know if we can be of any assistance in coordinating genetic counseling and/or testing for this family member.   FOLLOW-UP: Lastly, we discussed with Terri Hoover that cancer genetics is a rapidly advancing field and it is possible that new genetic tests will be appropriate for her and/or her family members in the future. We encouraged her to remain in contact with cancer genetics on an annual basis so we can update her personal and family histories and let her know of advances in cancer genetics that may benefit this family.   Our contact number was provided. Ms. Castagnola questions were answered to her satisfaction, and she knows she is welcome to call us at anytime with additional questions or concerns.   Clint Guy, MS, [facility] Certified Genetic Counselor [email or web address] Phone: [phone]

## 2019-03-10 NOTE — Telephone Encounter (Signed)
Scheduled appt per 1/11 sch message - unable to reach pt . Left message with appt date and time of chemo edu and first chemo

## 2019-03-12 ENCOUNTER — Encounter: Payer: Self-pay | Admitting: *Deleted

## 2019-03-13 ENCOUNTER — Other Ambulatory Visit (HOSPITAL_COMMUNITY)
Admission: RE | Admit: 2019-03-13 | Discharge: 2019-03-13 | Disposition: A | Discharge status: Home / Self Care | Payer: Managed Care, Other (non HMO) | Source: Ambulatory Visit | Attending: General Surgery | Admitting: General Surgery

## 2019-03-13 ENCOUNTER — Other Ambulatory Visit: Payer: Self-pay

## 2019-03-13 ENCOUNTER — Other Ambulatory Visit (HOSPITAL_COMMUNITY): Payer: Managed Care, Other (non HMO)

## 2019-03-13 DIAGNOSIS — Z20822 Contact with and (suspected) exposure to covid-19: Secondary | ICD-10-CM | POA: Diagnosis not present

## 2019-03-13 DIAGNOSIS — Z01812 Encounter for preprocedural laboratory examination: Secondary | ICD-10-CM | POA: Diagnosis not present

## 2019-03-13 LAB — SARS CORONAVIRUS 2 (TAT 6-24 HRS): SARS Coronavirus 2: NEGATIVE

## 2019-03-16 MED ORDER — ENSURE PRE-SURGERY PO LIQD: 296.0000 mL | Freq: Once | ORAL | Status: DC

## 2019-03-16 NOTE — Progress Notes (Signed)

## 2019-03-17 ENCOUNTER — Ambulatory Visit (HOSPITAL_COMMUNITY): Payer: Managed Care, Other (non HMO)

## 2019-03-17 ENCOUNTER — Encounter (HOSPITAL_BASED_OUTPATIENT_CLINIC_OR_DEPARTMENT_OTHER)
Admission: RE | Disposition: A | Discharge status: Home / Self Care | Payer: Self-pay | Source: Home / Self Care | Attending: General Surgery

## 2019-03-17 ENCOUNTER — Encounter (HOSPITAL_BASED_OUTPATIENT_CLINIC_OR_DEPARTMENT_OTHER): Payer: Self-pay | Admitting: General Surgery

## 2019-03-17 ENCOUNTER — Other Ambulatory Visit: Payer: Self-pay

## 2019-03-17 ENCOUNTER — Ambulatory Visit (HOSPITAL_BASED_OUTPATIENT_CLINIC_OR_DEPARTMENT_OTHER)
Admission: RE | Admit: 2019-03-17 | Discharge: 2019-03-17 | Disposition: A | Discharge status: Home / Self Care | Payer: Managed Care, Other (non HMO) | Attending: General Surgery | Admitting: General Surgery

## 2019-03-17 ENCOUNTER — Ambulatory Visit (HOSPITAL_BASED_OUTPATIENT_CLINIC_OR_DEPARTMENT_OTHER): Payer: Managed Care, Other (non HMO) | Admitting: Anesthesiology

## 2019-03-17 ENCOUNTER — Ambulatory Visit (HOSPITAL_COMMUNITY)
Admission: RE | Admit: 2019-03-17 | Discharge: 2019-03-17 | Disposition: A | Discharge status: Home / Self Care | Payer: Managed Care, Other (non HMO) | Source: Ambulatory Visit | Attending: General Surgery | Admitting: General Surgery

## 2019-03-17 DIAGNOSIS — N6489 Other specified disorders of breast: Secondary | ICD-10-CM | POA: Insufficient documentation

## 2019-03-17 DIAGNOSIS — Z452 Encounter for adjustment and management of vascular access device: Secondary | ICD-10-CM

## 2019-03-17 DIAGNOSIS — E063 Autoimmune thyroiditis: Secondary | ICD-10-CM | POA: Diagnosis not present

## 2019-03-17 DIAGNOSIS — F419 Anxiety disorder, unspecified: Secondary | ICD-10-CM | POA: Insufficient documentation

## 2019-03-17 DIAGNOSIS — Z79890 Hormone replacement therapy (postmenopausal): Secondary | ICD-10-CM | POA: Diagnosis not present

## 2019-03-17 DIAGNOSIS — E039 Hypothyroidism, unspecified: Secondary | ICD-10-CM | POA: Diagnosis not present

## 2019-03-17 DIAGNOSIS — Z79899 Other long term (current) drug therapy: Secondary | ICD-10-CM | POA: Insufficient documentation

## 2019-03-17 DIAGNOSIS — N6081 Other benign mammary dysplasias of right breast: Secondary | ICD-10-CM | POA: Diagnosis not present

## 2019-03-17 DIAGNOSIS — C50111 Malignant neoplasm of central portion of right female breast: Secondary | ICD-10-CM | POA: Diagnosis not present

## 2019-03-17 DIAGNOSIS — C50211 Malignant neoplasm of upper-inner quadrant of right female breast: Secondary | ICD-10-CM

## 2019-03-17 DIAGNOSIS — Z95828 Presence of other vascular implants and grafts: Secondary | ICD-10-CM

## 2019-03-17 HISTORY — PX: PORTACATH PLACEMENT: SHX2246

## 2019-03-17 HISTORY — PX: BREAST LUMPECTOMY WITH SENTINEL LYMPH NODE BIOPSY: SHX5597

## 2019-03-17 LAB — POCT PREGNANCY, URINE: Preg Test, Ur: NEGATIVE

## 2019-03-17 SURGERY — BREAST LUMPECTOMY WITH SENTINEL LYMPH NODE BX
Anesthesia: General | Site: Chest | Laterality: Right

## 2019-03-17 MED ORDER — BUPIVACAINE HCL (PF) 0.25 % IJ SOLN
INTRAMUSCULAR | Status: DC | PRN
  Administered 2019-03-17: 12 mL

## 2019-03-17 MED ORDER — BUPIVACAINE LIPOSOME 1.3 % IJ SUSP
INTRAMUSCULAR | Status: DC | PRN
  Administered 2019-03-17: 10 mg via PERINEURAL

## 2019-03-17 MED ORDER — GABAPENTIN 100 MG PO CAPS
100.0000 mg | ORAL_CAPSULE | ORAL | Status: AC
  Administered 2019-03-17: 100 mg via ORAL

## 2019-03-17 MED ORDER — KETOROLAC TROMETHAMINE 15 MG/ML IJ SOLN
INTRAMUSCULAR | Status: AC
  Filled 2019-03-17: qty 1

## 2019-03-17 MED ORDER — OXYCODONE HCL 5 MG/5ML PO SOLN: 5.0000 mg | Freq: Once | ORAL | Status: DC | PRN

## 2019-03-17 MED ORDER — MIDAZOLAM HCL 2 MG/2ML IJ SOLN
INTRAMUSCULAR | Status: DC | PRN
  Administered 2019-03-17: 2 mg via INTRAVENOUS

## 2019-03-17 MED ORDER — ONDANSETRON HCL 4 MG/2ML IJ SOLN
INTRAMUSCULAR | Status: DC | PRN
  Administered 2019-03-17: 4 mg via INTRAVENOUS

## 2019-03-17 MED ORDER — LACTATED RINGERS IV SOLN: INTRAVENOUS | Status: DC

## 2019-03-17 MED ORDER — SODIUM CHLORIDE (PF) 0.9 % IJ SOLN
INTRAVENOUS | Status: DC | PRN
  Administered 2019-03-17: 5 mL via INTRADERMAL

## 2019-03-17 MED ORDER — FENTANYL CITRATE (PF) 100 MCG/2ML IJ SOLN: 25.0000 ug | INTRAMUSCULAR | Status: DC | PRN

## 2019-03-17 MED ORDER — OXYCODONE HCL 5 MG PO TABS: 5.0000 mg | ORAL_TABLET | Freq: Once | ORAL | Status: DC | PRN

## 2019-03-17 MED ORDER — FENTANYL CITRATE (PF) 100 MCG/2ML IJ SOLN
50.0000 ug | INTRAMUSCULAR | Status: DC | PRN
  Administered 2019-03-17 (×2): 50 ug via INTRAVENOUS

## 2019-03-17 MED ORDER — KETOROLAC TROMETHAMINE 15 MG/ML IJ SOLN
15.0000 mg | INTRAMUSCULAR | Status: AC
  Administered 2019-03-17: 15 mg via INTRAVENOUS

## 2019-03-17 MED ORDER — MIDAZOLAM HCL 2 MG/2ML IJ SOLN
INTRAMUSCULAR | Status: AC
  Filled 2019-03-17: qty 2

## 2019-03-17 MED ORDER — MEPERIDINE HCL 25 MG/ML IJ SOLN: 6.2500 mg | INTRAMUSCULAR | Status: DC | PRN

## 2019-03-17 MED ORDER — FENTANYL CITRATE (PF) 100 MCG/2ML IJ SOLN
INTRAMUSCULAR | Status: AC
  Filled 2019-03-17: qty 2

## 2019-03-17 MED ORDER — ONDANSETRON HCL 4 MG/2ML IJ SOLN: 4.0000 mg | Freq: Once | INTRAMUSCULAR | Status: DC | PRN

## 2019-03-17 MED ORDER — KETOROLAC TROMETHAMINE 15 MG/ML IJ SOLN
15.0000 mg | Freq: Once | INTRAMUSCULAR | Status: AC
  Administered 2019-03-17: 15 mg via INTRAVENOUS

## 2019-03-17 MED ORDER — EPHEDRINE 5 MG/ML INJ
INTRAVENOUS | Status: AC
  Filled 2019-03-17: qty 50

## 2019-03-17 MED ORDER — LACTATED RINGERS IV SOLN: INTRAVENOUS | Status: DC | PRN

## 2019-03-17 MED ORDER — PHENYLEPHRINE 40 MCG/ML (10ML) SYRINGE FOR IV PUSH (FOR BLOOD PRESSURE SUPPORT)
PREFILLED_SYRINGE | INTRAVENOUS | Status: AC
  Filled 2019-03-17: qty 20

## 2019-03-17 MED ORDER — PROPOFOL 10 MG/ML IV BOLUS
INTRAVENOUS | Status: AC
  Filled 2019-03-17: qty 20

## 2019-03-17 MED ORDER — HEPARIN (PORCINE) IN NACL 1000-0.9 UT/500ML-% IV SOLN
INTRAVENOUS | Status: AC
  Filled 2019-03-17: qty 500

## 2019-03-17 MED ORDER — CEFAZOLIN SODIUM-DEXTROSE 2-4 GM/100ML-% IV SOLN
INTRAVENOUS | Status: AC
  Filled 2019-03-17: qty 100

## 2019-03-17 MED ORDER — CEFAZOLIN SODIUM-DEXTROSE 2-3 GM-%(50ML) IV SOLR
INTRAVENOUS | Status: DC | PRN
  Administered 2019-03-17: 2 g via INTRAVENOUS

## 2019-03-17 MED ORDER — FENTANYL CITRATE (PF) 100 MCG/2ML IJ SOLN
INTRAMUSCULAR | Status: DC | PRN
  Administered 2019-03-17: 50 ug via INTRAVENOUS

## 2019-03-17 MED ORDER — PROMETHAZINE HCL 25 MG/ML IJ SOLN
INTRAMUSCULAR | Status: AC
  Filled 2019-03-17: qty 1

## 2019-03-17 MED ORDER — BUPIVACAINE-EPINEPHRINE (PF) 0.5% -1:200000 IJ SOLN
INTRAMUSCULAR | Status: DC | PRN
  Administered 2019-03-17: 20 mL via PERINEURAL

## 2019-03-17 MED ORDER — HEPARIN SOD (PORK) LOCK FLUSH 100 UNIT/ML IV SOLN
INTRAVENOUS | Status: AC
  Filled 2019-03-17: qty 5

## 2019-03-17 MED ORDER — SODIUM CHLORIDE (PF) 0.9 % IJ SOLN
INTRAMUSCULAR | Status: AC
  Filled 2019-03-17: qty 10

## 2019-03-17 MED ORDER — HEPARIN (PORCINE) IN NACL 2-0.9 UNITS/ML
INTRAMUSCULAR | Status: AC | PRN
  Administered 2019-03-17: 1 via INTRAVENOUS

## 2019-03-17 MED ORDER — HEPARIN SOD (PORK) LOCK FLUSH 100 UNIT/ML IV SOLN
INTRAVENOUS | Status: DC | PRN
  Administered 2019-03-17: 400 [IU] via INTRAVENOUS

## 2019-03-17 MED ORDER — METHYLENE BLUE 0.5 % INJ SOLN
INTRAVENOUS | Status: AC
  Filled 2019-03-17: qty 10

## 2019-03-17 MED ORDER — ACETAMINOPHEN 500 MG PO TABS
ORAL_TABLET | ORAL | Status: AC
  Filled 2019-03-17: qty 2

## 2019-03-17 MED ORDER — MIDAZOLAM HCL 2 MG/2ML IJ SOLN
1.0000 mg | INTRAMUSCULAR | Status: DC | PRN
  Administered 2019-03-17 (×2): 1 mg via INTRAVENOUS

## 2019-03-17 MED ORDER — CEFAZOLIN SODIUM-DEXTROSE 2-4 GM/100ML-% IV SOLN: 2.0000 g | INTRAVENOUS | Status: DC

## 2019-03-17 MED ORDER — PROMETHAZINE HCL 25 MG/ML IJ SOLN
6.2500 mg | Freq: Once | INTRAMUSCULAR | Status: AC
  Administered 2019-03-17: 6.25 mg via INTRAVENOUS

## 2019-03-17 MED ORDER — EPHEDRINE SULFATE 50 MG/ML IJ SOLN
INTRAMUSCULAR | Status: DC | PRN
  Administered 2019-03-17: 10 mg via INTRAVENOUS

## 2019-03-17 MED ORDER — GABAPENTIN 100 MG PO CAPS
ORAL_CAPSULE | ORAL | Status: AC
  Filled 2019-03-17: qty 1

## 2019-03-17 MED ORDER — LIDOCAINE HCL (CARDIAC) PF 100 MG/5ML IV SOSY
PREFILLED_SYRINGE | INTRAVENOUS | Status: DC | PRN
  Administered 2019-03-17: 40 mg via INTRAVENOUS

## 2019-03-17 MED ORDER — TECHNETIUM TC 99M SULFUR COLLOID FILTERED
1.0000 | Freq: Once | INTRAVENOUS | Status: AC | PRN
  Administered 2019-03-17: 11:00:00 1 via INTRADERMAL

## 2019-03-17 MED ORDER — ACETAMINOPHEN 500 MG PO TABS
1000.0000 mg | ORAL_TABLET | ORAL | Status: AC
  Administered 2019-03-17: 1000 mg via ORAL

## 2019-03-17 MED ORDER — ONDANSETRON HCL 4 MG/2ML IJ SOLN: INTRAMUSCULAR | Status: DC | PRN

## 2019-03-17 MED ORDER — PROPOFOL 10 MG/ML IV BOLUS
INTRAVENOUS | Status: DC | PRN
  Administered 2019-03-17: 130 mg via INTRAVENOUS

## 2019-03-17 SURGICAL SUPPLY — 82 items
ADH SKN CLS APL DERMABOND .7 (GAUZE/BANDAGES/DRESSINGS) ×2
APL PRP STRL LF DISP 70% ISPRP (MISCELLANEOUS) ×2
APL SKNCLS STERI-STRIP NONHPOA (GAUZE/BANDAGES/DRESSINGS)
APPLIER CLIP 9.375 MED OPEN (MISCELLANEOUS) ×3
APR CLP MED 9.3 20 MLT OPN (MISCELLANEOUS) ×2
BAG DECANTER FOR FLEXI CONT (MISCELLANEOUS) ×3 IMPLANT
BENZOIN TINCTURE PRP APPL 2/3 (GAUZE/BANDAGES/DRESSINGS) ×2 IMPLANT
BINDER BREAST LRG (GAUZE/BANDAGES/DRESSINGS) ×1 IMPLANT
BINDER BREAST MEDIUM (GAUZE/BANDAGES/DRESSINGS) ×1 IMPLANT
BINDER BREAST XLRG (GAUZE/BANDAGES/DRESSINGS) IMPLANT
BINDER BREAST XXLRG (GAUZE/BANDAGES/DRESSINGS) IMPLANT
BLADE SURG 11 STRL SS (BLADE) ×3 IMPLANT
BLADE SURG 15 STRL LF DISP TIS (BLADE) ×2 IMPLANT
BLADE SURG 15 STRL SS (BLADE) ×3
BNDG COHESIVE 4X5 TAN STRL (GAUZE/BANDAGES/DRESSINGS) IMPLANT
CANISTER SUCT 1200ML W/VALVE (MISCELLANEOUS) ×1 IMPLANT
CHLORAPREP W/TINT 26 (MISCELLANEOUS) ×3 IMPLANT
CLIP APPLIE 9.375 MED OPEN (MISCELLANEOUS) IMPLANT
CLIP VESOCCLUDE SM WIDE 6/CT (CLIP) IMPLANT
COVER BACK TABLE 60X90IN (DRAPES) ×3 IMPLANT
COVER MAYO STAND STRL (DRAPES) ×3 IMPLANT
COVER PROBE 5X48 (MISCELLANEOUS) ×3
COVER PROBE W GEL 5X96 (DRAPES) ×3 IMPLANT
COVER WAND RF STERILE (DRAPES) IMPLANT
DECANTER SPIKE VIAL GLASS SM (MISCELLANEOUS) IMPLANT
DERMABOND ADVANCED (GAUZE/BANDAGES/DRESSINGS) ×1
DERMABOND ADVANCED .7 DNX12 (GAUZE/BANDAGES/DRESSINGS) ×2 IMPLANT
DRAIN CHANNEL 19F RND (DRAIN) IMPLANT
DRAPE C-ARM 42X72 X-RAY (DRAPES) ×3 IMPLANT
DRAPE LAPAROSCOPIC ABDOMINAL (DRAPES) ×4 IMPLANT
DRAPE U-SHAPE 76X120 STRL (DRAPES) IMPLANT
DRAPE UTILITY XL STRL (DRAPES) ×3 IMPLANT
DRSG TEGADERM 4X4.75 (GAUZE/BANDAGES/DRESSINGS) ×3 IMPLANT
ELECT COATED BLADE 2.86 ST (ELECTRODE) ×3 IMPLANT
ELECT REM PT RETURN 9FT ADLT (ELECTROSURGICAL) ×3
ELECTRODE REM PT RTRN 9FT ADLT (ELECTROSURGICAL) ×2 IMPLANT
EVACUATOR SILICONE 100CC (DRAIN) IMPLANT
GAUZE SPONGE 4X4 12PLY STRL LF (GAUZE/BANDAGES/DRESSINGS) ×2 IMPLANT
GLOVE BIO SURGEON STRL SZ7 (GLOVE) ×5 IMPLANT
GLOVE BIOGEL PI IND STRL 7.0 (GLOVE) IMPLANT
GLOVE BIOGEL PI IND STRL 7.5 (GLOVE) ×2 IMPLANT
GLOVE BIOGEL PI INDICATOR 7.0 (GLOVE) ×2
GLOVE BIOGEL PI INDICATOR 7.5 (GLOVE) ×2
GLOVE ECLIPSE 6.5 STRL STRAW (GLOVE) ×1 IMPLANT
GOWN STRL REUS W/ TWL LRG LVL3 (GOWN DISPOSABLE) ×6 IMPLANT
GOWN STRL REUS W/TWL LRG LVL3 (GOWN DISPOSABLE) ×12
HEMOSTAT ARISTA ABSORB 3G PWDR (HEMOSTASIS) IMPLANT
IV KIT MINILOC 20X1 SAFETY (NEEDLE) IMPLANT
KIT CVR 48X5XPRB PLUP LF (MISCELLANEOUS) IMPLANT
KIT MARKER MARGIN INK (KITS) ×2 IMPLANT
KIT PORT POWER 8FR ISP CVUE (Port) ×1 IMPLANT
NDL HYPO 25X1 1.5 SAFETY (NEEDLE) ×4 IMPLANT
NDL SAFETY ECLIPSE 18X1.5 (NEEDLE) ×2 IMPLANT
NEEDLE HYPO 18GX1.5 SHARP (NEEDLE)
NEEDLE HYPO 25X1 1.5 SAFETY (NEEDLE) ×6 IMPLANT
NS IRRIG 1000ML POUR BTL (IV SOLUTION) IMPLANT
PACK BASIN DAY SURGERY FS (CUSTOM PROCEDURE TRAY) ×3 IMPLANT
PENCIL SMOKE EVACUATOR (MISCELLANEOUS) ×3 IMPLANT
PIN SAFETY STERILE (MISCELLANEOUS) IMPLANT
RETRACTOR ONETRAX LX 90X20 (MISCELLANEOUS) ×1 IMPLANT
SLEEVE SCD COMPRESS KNEE MED (MISCELLANEOUS) ×3 IMPLANT
SPONGE LAP 18X18 RF (DISPOSABLE) IMPLANT
SPONGE LAP 4X18 RFD (DISPOSABLE) ×3 IMPLANT
STRIP CLOSURE SKIN 1/2X4 (GAUZE/BANDAGES/DRESSINGS) ×3 IMPLANT
SUT MNCRL AB 4-0 PS2 18 (SUTURE) ×3 IMPLANT
SUT MON AB 5-0 PS2 18 (SUTURE) ×1 IMPLANT
SUT PROLENE 2 0 SH DA (SUTURE) ×3 IMPLANT
SUT SILK 2 0 SH (SUTURE) ×1 IMPLANT
SUT SILK 2 0 TIES 17X18 (SUTURE)
SUT SILK 2-0 18XBRD TIE BLK (SUTURE) IMPLANT
SUT VIC AB 2-0 SH 27 (SUTURE) ×9
SUT VIC AB 2-0 SH 27XBRD (SUTURE) ×2 IMPLANT
SUT VIC AB 3-0 SH 27 (SUTURE) ×3
SUT VIC AB 3-0 SH 27X BRD (SUTURE) ×2 IMPLANT
SUT VIC AB 5-0 PS2 18 (SUTURE) IMPLANT
SUT VICRYL AB 3 0 TIES (SUTURE) IMPLANT
SYR 5ML LUER SLIP (SYRINGE) ×3 IMPLANT
SYR CONTROL 10ML LL (SYRINGE) ×6 IMPLANT
TOWEL GREEN STERILE FF (TOWEL DISPOSABLE) ×3 IMPLANT
TRAY FAXITRON CT DISP (TRAY / TRAY PROCEDURE) IMPLANT
TUBE CONNECTING 20X1/4 (TUBING) ×1 IMPLANT
YANKAUER SUCT BULB TIP NO VENT (SUCTIONS) ×1 IMPLANT

## 2019-03-17 NOTE — Discharge Instructions (Signed)
[facility] Office Phone Number [phone]  BREAST BIOPSY/ PARTIAL MASTECTOMY: POST OP INSTRUCTIONS Take 400 mg of ibuprofen every 8 hours or 650 mg tylenol every 6 hours for next 72 hours then as needed. Use ice several times daily also. Always review your discharge instruction sheet given to you by the facility where your surgery was performed.  IF YOU HAVE DISABILITY OR FAMILY LEAVE FORMS, YOU MUST BRING THEM TO THE OFFICE FOR PROCESSING.  DO NOT GIVE THEM TO YOUR DOCTOR.  1. A prescription for pain medication may be given to you upon discharge.  Take your pain medication as prescribed, if needed.  If narcotic pain medicine is not needed, then you may take acetaminophen (Tylenol), naprosyn (Alleve) or ibuprofen (Advil) as needed. 2. Take your usually prescribed medications unless otherwise directed 3. If you need a refill on your pain medication, please contact your pharmacy.  They will contact our office to request authorization.  Prescriptions will not be filled after 5pm or on week-ends. 4. You should eat very light the first 24 hours after surgery, such as soup, crackers, pudding, etc.  Resume your normal diet the day after surgery. 5. Most patients will experience some swelling and bruising in the breast.  Ice packs and a good support bra will help.  Wear the breast binder provided or a sports bra for 72 hours day and night.  After that wear a sports bra during the day until you return to the office. Swelling and bruising can take several days to resolve.  6. It is common to experience some constipation if taking pain medication after surgery.  Increasing fluid intake and taking a stool softener will usually help or prevent this problem from occurring.  A mild laxative (Milk of Magnesia or Miralax) should be taken according to package directions if there are no bowel movements after 48 hours. 7. Unless discharge instructions indicate otherwise, you may remove your bandages 48  hours after surgery and you may shower at that time.  You may have steri-strips (small skin tapes) in place directly over the incision.  These strips should be left on the skin for 7-10 days and will come off on their own.  If your surgeon used skin glue on the incision, you may shower in 24 hours.  The glue will flake off over the next 2-3 weeks.  Any sutures or staples will be removed at the office during your follow-up visit. 8. ACTIVITIES:  You may resume regular daily activities (gradually increasing) beginning the next day.  Wearing a good support bra or sports bra minimizes pain and swelling.  You may have sexual intercourse when it is comfortable. a. You may drive when you no longer are taking prescription pain medication, you can comfortably wear a seatbelt, and you can safely maneuver your car and apply brakes. b. RETURN TO WORK:  ______________________________________________________________________________________ 9. You should see your doctor in the office for a follow-up appointment approximately two weeks after your surgery.  Your doctor's nurse will typically make your follow-up appointment when she calls you with your pathology report.  Expect your pathology report 3-4 business days after your surgery.  You may call to check if you do not hear from us after three days. 10. OTHER INSTRUCTIONS: _______________________________________________________________________________________________ _____________________________________________________________________________________________________________________________________ _____________________________________________________________________________________________________________________________________ _____________________________________________________________________________________________________________________________________  WHEN TO CALL DR WAKEFIELD: 1. Fever over 101.0 2. Nausea and/or vomiting. 3. Extreme swelling or  bruising. 4. Continued bleeding from incision. 5. Increased pain, redness, or drainage from the incision.  The clinic   staff is available to answer your questions during regular business hours.  Please don't hesitate to call and ask to speak to one of the nurses for clinical concerns.  If you have a medical emergency, go to the nearest emergency room or call 911.  A surgeon from [facility] is always on call at the hospital.  For further questions, please visit [email or web address]    No tylenol until 4pm if needed, no ibuprofen until 10:30pm if needed    Post Anesthesia Home Care Instructions  Activity: Get plenty of rest for the remainder of the day. A responsible individual must stay with you for 24 hours following the procedure.  For the next 24 hours, DO NOT: -Drive a car -Paediatric nurse -Drink alcoholic beverages -Take any medication unless instructed by your physician -Make any legal decisions or sign important papers.  Meals: Start with liquid foods such as gelatin or soup. Progress to regular foods as tolerated. Avoid greasy, spicy, heavy foods. If nausea and/or vomiting occur, drink only clear liquids until the nausea and/or vomiting subsides. Call your physician if vomiting continues.  Special Instructions/Symptoms: Your throat may feel dry or sore from the anesthesia or the breathing tube placed in your throat during surgery. If this causes discomfort, gargle with warm salt water. The discomfort should disappear within 24 hours.  If you had a scopolamine patch placed behind your ear for the management of post- operative nausea and/or vomiting:  1. The medication in the patch is effective for 72 hours, after which it should be removed.  Wrap patch in a tissue and discard in the trash. Wash hands thoroughly with soap and water. 2. You may remove the patch earlier than 72 hours if you experience unpleasant side effects which may include dry mouth,  dizziness or visual disturbances. 3. Avoid touching the patch. Wash your hands with soap and water after contact with the patch.      Information for Discharge Teaching: EXPAREL (bupivacaine liposome injectable suspension)   Your surgeon or anesthesiologist gave you EXPAREL(bupivacaine) to help control your pain after surgery.   EXPAREL is a local anesthetic that provides pain relief by numbing the tissue around the surgical site.  EXPAREL is designed to release pain medication over time and can control pain for up to 72 hours.  Depending on how you respond to EXPAREL, you may require less pain medication during your recovery.  Possible side effects:  Temporary loss of sensation or ability to move in the area where bupivacaine was injected.  Nausea, vomiting, constipation  Rarely, numbness and tingling in your mouth or lips, lightheadedness, or anxiety may occur.  Call your doctor right away if you think you may be experiencing any of these sensations, or if you have other questions regarding possible side effects.  Follow all other discharge instructions given to you by your surgeon or nurse. Eat a healthy diet and drink plenty of water or other fluids.  If you return to the hospital for any reason within 96 hours following the administration of EXPAREL, it is important for health care providers to know that you have received this anesthetic. A teal colored band has been placed on your arm with the date, time and amount of EXPAREL you have received in order to alert and inform your health care providers. Please leave this armband in place for the full 96 hours following administration, and then you may remove the band.

## 2019-03-17 NOTE — Op Note (Signed)
Preoperative diagnosis: Right breast cancer on excisional biopsy for csl, oncotype 24 Postoperative diagnosis: saa Procedure: 1. Re-excision right breast lumpectomy 2. Port placement with US guidance 3. Right deep axillary sentinel node biopsy 4. Injection blue dye for sentinel node identification Surgeon: Dr Serita Grammes Anes: general with pec block EBL: 20 cc Drains none Specimens: 1. Right lateral breast margin marked short superior, long lateral double deep 2. Right deep axillary sentinel nodes highest count [id number] Complications none Sponge and needle count correct at completion dispo to recovery stable.  Indications: 22 yof who had a csl on core biopsy.  I did an excisional biopsy with grade I 8 mm IDC involving lateral margin, her 2 negative, er/pr pos with low ki. oncotype is 24.  She is here for sn biopsy, excision of margin and port placement.  Procedure: After informed consent obtained she underwent pectoral block. She was injected with technetium in standard periareolar fashion. She was given antibiotics and SCDs were in place. She was placed under general anesthesia without complication.I first did the port so she was prepped and draped for this in standard sterile surgical fashion. Timeout was performed.  I then identified the internal jugular vein on the right side with the ultrasound. I made a small nick in the skin. I accessed the internal jugular vein with the needle under ultrasound guidance. I passed the wire. The wire was confirmed to be in position with fluoroscopy.The wire was in the vein by ultrasound as well.I then infiltrated Marcaine below the clavicle on the right side. I made an incision and developed a subcutaneous pocket for the port. I then tunneled between the port site as well as the insertion site. I brought the line through this. I then placed the dilator under fluoroscopic guidance over the wire. I removedthe wire andthe dilator. I then placed  the line into the sheath. The sheath was then removed. I pulled the line back to be in the distal vena cava. I then hooked this up to the port. This was placed in the pocket and sutured in place with 2-0 Prolene suture. I then closed this with 3-0 Vicryl and 4-0 Monocryl. Glue was placed.  She was then repositioned and reprepped and draped.  I infiltrated 5 cc of methylene blue and saline in periareolar fashion for sentinel node identification after repeating a timeout.    I then reopened the breast incision and released my sutures. I excised the lateral margin that was positive and marked as above. Hemostasis was obtained. I left clips in the cavity for radiotherapy.  I then closed the cavity with 2-0 vicryl.  The skin was closed with 3-0 vicryl, 5-0 monocryl and glue.  I then located the sentinel node and made an incision in low axilla below hairline. I then went through the fascia. I was able to identify several blue and hot nodes. I clipped the lymphatics.  I then removed the sentinel nodes with highest count as above.  There was no background radioactivity or palpable nodes. I obtained hemostasis. I then closed with 2-0 vicryl, 3-0 vicryl and 4-0 monocryl. Glue was placed. She tolerated well, was extubated and transferred to recovery stable.

## 2019-03-17 NOTE — Anesthesia Procedure Notes (Signed)
Anesthesia Regional Block: Pectoralis block   Pre-Anesthetic Checklist: ,, timeout performed, Correct Patient, Correct Site, Correct Laterality, Correct Procedure, Correct Position, site marked, Risks and benefits discussed, pre-op evaluation,  At surgeon's request and post-op pain management  Laterality: Right  Prep: Maximum Sterile Barrier Precautions used, chloraprep       Needles:  Injection technique: Single-shot  Needle Type: Echogenic Stimulator Needle     Needle Length: 9cm  Needle Gauge: 22     Additional Needles:   Procedures:,,,, ultrasound used (permanent image in chart),,,,  Narrative:  Start time: 03/17/2019 10:36 AM End time: 03/17/2019 10:39 AM Injection made incrementally with aspirations every 5 mL.  Performed by: Personally  Anesthesiologist: Brennan Bailey, MD  Additional Notes: Risks, benefits, and alternative discussed. Patient gave consent for procedure. Patient prepped and draped in sterile fashion. Sedation administered, patient remains easily responsive to voice. Relevant anatomy identified with ultrasound guidance. Local anesthetic given in 5cc increments with no signs or symptoms of intravascular injection. No pain or paraesthesias with injection. Patient monitored throughout procedure with signs of LAST or immediate complications. Tolerated well. Ultrasound image placed in chart.  Tawny Asal, MD

## 2019-03-17 NOTE — Anesthesia Postprocedure Evaluation (Signed)
Anesthesia Post Note  Patient: Terri Hoover  Procedure(s) Performed: RE EXCISION OF RIGHT BREAST LUMPECTOMY WITH RIGHT AXILLARY SENTINEL LYMPH NODE BIOPSY AND BLUE DYE INJECTION (Right Breast) INSERTION PORT-A-CATH WITH ULTRASOUND (Right Chest)     Patient location during evaluation: PACU Anesthesia Type: General Level of consciousness: awake and alert and oriented Pain management: pain level controlled Vital Signs Assessment: post-procedure vital signs reviewed and stable Respiratory status: spontaneous breathing, nonlabored ventilation and respiratory function stable Cardiovascular status: blood pressure returned to baseline Postop Assessment: no apparent nausea or vomiting Anesthetic complications: no    Last Vitals:  Vitals:   03/17/19 1400 03/17/19 1415  BP: 131/79 132/74  Pulse: 82 76  Resp: 12 13  Temp:    SpO2: 99% 97%    Last Pain:  Vitals:   03/17/19 1345  TempSrc:   PainSc: [place]

## 2019-03-17 NOTE — Progress Notes (Signed)
Nuc med inj performed by nuc med staff. Pt tol well with additional fentanyl and versed. Emotional support provided.

## 2019-03-17 NOTE — Progress Notes (Signed)
Assisted Dr. Daiva Huge with right, ultrasound guided, pectoralis block. Side rails up, monitors on throughout procedure. See vital signs in flow sheet. Tolerated Procedure well.

## 2019-03-17 NOTE — Interval H&P Note (Signed)
History and Physical Interval Note:  03/17/2019 10:40 AM  [name]  has presented today for surgery, with the diagnosis of RIGHT BREAST CANCER.  The various methods of treatment have been discussed with the patient and family. After consideration of risks, benefits and other options for treatment, the patient has consented to  Procedure(s): RE EXCISION OF RIGHT BREAST LUMPECTOMY WITH RIGHT AXILLARY SENTINEL LYMPH NODE BIOPSY AND BLUE DYE INJECTION (Right) INSERTION PORT-A-CATH WITH ULTRASOUND (N/A) as a surgical intervention.  The patient's history has been reviewed, patient examined, no change in status, stable for surgery.  I have reviewed the patient's chart and labs.  Questions were answered to the patient's satisfaction.     Rolm Bookbinder

## 2019-03-17 NOTE — Brief Op Note (Signed)
03/17/2019  12:41 PM  PATIENT:  Terri Hoover  44 y.o. female  PRE-OPERATIVE DIAGNOSIS:  RIGHT BREAST CANCER  POST-OPERATIVE DIAGNOSIS:  RIGHT BREAST CANCER  PROCEDURE:  Procedure(s): RE EXCISION OF RIGHT BREAST LUMPECTOMY WITH RIGHT AXILLARY SENTINEL LYMPH NODE BIOPSY AND BLUE DYE INJECTION (Right) INSERTION PORT-A-CATH WITH ULTRASOUND (N/A)  SURGEON:  Surgeon(s) and Role:    * Rolm Bookbinder, MD - Primary  PHYSICIAN ASSISTANT:   ASSISTANTS: none   ANESTHESIA:   general with pectoral block  EBL:  minimal   BLOOD ADMINISTERED:none  DRAINS: none   LOCAL MEDICATIONS USED:  MARCAINE     SPECIMEN:  Excision  DISPOSITION OF SPECIMEN:  PATHOLOGY  COUNTS:  YES  TOURNIQUET:  * No tourniquets in log *  DICTATION: .Dragon Dictation  PLAN OF CARE: Discharge to home after PACU  PATIENT DISPOSITION:  PACU - hemodynamically stable.   Delay start of Pharmacological VTE agent (>24hrs) due to surgical blood loss or risk of bleeding: not applicable

## 2019-03-17 NOTE — Anesthesia Preprocedure Evaluation (Addendum)
Anesthesia Evaluation  Patient identified by MRN, date of birth, ID band Patient awake    Reviewed: Allergy & Precautions, NPO status , Patient's Chart, lab work & pertinent test results  History of Anesthesia Complications Negative for: history of anesthetic complications  Airway Mallampati: II  TM Distance: >3 FB Neck ROM: Full    Dental no notable dental hx.    Pulmonary neg pulmonary ROS,    Pulmonary exam normal        Cardiovascular negative cardio ROS Normal cardiovascular exam     Neuro/Psych Anxiety negative neurological ROS     GI/Hepatic negative GI ROS, Neg liver ROS,   Endo/Other  Hypothyroidism   Renal/GU negative Renal ROS  negative genitourinary   Musculoskeletal negative musculoskeletal ROS (+)   Abdominal   Peds  Hematology negative hematology ROS (+)   Anesthesia Other Findings Right breast ca  Reproductive/Obstetrics negative OB ROS                            Anesthesia Physical Anesthesia Plan  ASA: II  Anesthesia Plan: General   Post-op Pain Management: GA combined w/ Regional for post-op pain   Induction: Intravenous  PONV Risk Score and Plan: 3 and Treatment may vary due to age or medical condition, Ondansetron, Dexamethasone and Midazolam  Airway Management Planned: LMA  Additional Equipment: None  Intra-op Plan:   Post-operative Plan: Extubation in OR  Informed Consent: I have reviewed the patients History and Physical, chart, labs and discussed the procedure including the risks, benefits and alternatives for the proposed anesthesia with the patient or authorized representative who has indicated his/her understanding and acceptance.     Dental advisory given  Plan Discussed with: CRNA  Anesthesia Plan Comments:        Anesthesia Quick Evaluation

## 2019-03-17 NOTE — Anesthesia Procedure Notes (Signed)
Procedure Name: LMA Insertion Performed by: Tanysha Quant M, CRNA Pre-anesthesia Checklist: Patient identified, Emergency Drugs available, Suction available and Patient being monitored Patient Re-evaluated:Patient Re-evaluated prior to induction Oxygen Delivery Method: Circle system utilized Preoxygenation: Pre-oxygenation with 100% oxygen Induction Type: IV induction Ventilation: Mask ventilation without difficulty LMA: LMA inserted LMA Size: 4.0 Number of attempts: 1 Airway Equipment and Method: Bite block Placement Confirmation: positive ETCO2 Tube secured with: Tape Dental Injury: Teeth and Oropharynx as per pre-operative assessment        

## 2019-03-17 NOTE — Transfer of Care (Signed)
Immediate Anesthesia Transfer of Care Note  Patient: Terri Hoover  Procedure(s) Performed: RE EXCISION OF RIGHT BREAST LUMPECTOMY WITH RIGHT AXILLARY SENTINEL LYMPH NODE BIOPSY AND BLUE DYE INJECTION (Right Breast) INSERTION PORT-A-CATH WITH ULTRASOUND (N/A )  Patient Location: PACU  Anesthesia Type:General  Level of Consciousness: awake, alert  and oriented  Airway & Oxygen Therapy: Patient Spontanous Breathing and Patient connected to face mask oxygen  Post-op Assessment: Report given to RN and Post -op Vital signs reviewed and stable  Post vital signs: Reviewed and stable  Last Vitals:  Vitals Value Taken Time  BP    Temp    Pulse 108 03/17/19 1250  Resp 12 03/17/19 1250  SpO2 100 % 03/17/19 1250  Vitals shown include unvalidated device data.  Last Pain:  Vitals:   03/17/19 1002  TempSrc: Oral  PainSc: 0-No pain         Complications: No apparent anesthesia complications

## 2019-03-17 NOTE — H&P (Signed)
Terri Hoover is an 44 y.o. female.   Chief Complaint: breast cancer HPI: 49 yof who had a csl on core biopsy.  I did an excisional biopsy with grade I 8 mm IDC involving lateral margin, her 2 negative, er/pr pos with low ki. oncotype is 24.  She is here for sn biopsy, excision of margin and port placement  Past Medical History:  Diagnosis Date  . Anxiety   . ASCUS of cervix with negative high risk HPV 09/2018  . Family history of breast cancer   . Family history of esophageal cancer   . Family history of lung cancer   . Family history of melanoma   . Family history of ovarian cancer   . Family history of thyroid cancer   . Frequency of urination   . Hashimoto's disease   . History of TMJ disorder   . Hypothyroidism     Past Surgical History:  Procedure Laterality Date  . BREAST BIOPSY Right 2016   benign  . BREAST BIOPSY Right 2019  . COLPOSCOPY  2000  . CYSTO WITH HYDRODISTENSION N/A 05/06/2013   Procedure: CYSTOSCOPY/HYDRODISTENSION WITH TRIGGER POINT INJECTION ;  Surgeon: Alexis Frock, MD;  Location: [facility];  Service: Urology;  Laterality: N/A;  . DILATION AND CURETTAGE OF UTERUS  09-04-2002   W/  SUCTION FOR POSTPARTUM RETAINED PRODUCTS  . DX LAPAROSCOPY /  LASER ABLATION ENDOMETRIOSIS  2002  . RADIOACTIVE SEED GUIDED EXCISIONAL BREAST BIOPSY Right 02/10/2019   Procedure: RADIOACTIVE SEED GUIDED EXCISIONAL RIGHT BREAST BIOPSY;  Surgeon: Rolm Bookbinder, MD;  Location: [facility];  Service: General;  Laterality: Right;  . TEMPOROMANDIBULAR JOINT SURGERY  2000    Family History  Problem Relation Age of Onset  . Heart disease Father   . Breast cancer Maternal Aunt 35  . Breast cancer Maternal Grandmother 85       recurred at age 76  . Esophageal cancer Paternal Uncle 83  . Thyroid cancer Maternal Grandfather        dx. in his 28s  . Melanoma Paternal Grandmother        dx. in her 19s  . Lung cancer Paternal Grandfather      dx. in his 5s  . Bladder Cancer Paternal Grandfather 56  . Breast cancer Other 65       maternal grandmother's sister  . Breast cancer Other 50       paternal 4th degree relative  . Liver cancer Other 55  . Ovarian cancer Other 37       paternal 5th degree relative   Social History:  reports that she has never smoked. She has never used smokeless tobacco. She reports current alcohol use. She reports that she does not use drugs.  Allergies: No Known Allergies  Medications Prior to Admission  Medication Sig Dispense Refill  . ALPRAZolam (XANAX) 0.25 MG tablet Take 1 tablet (0.25 mg total) by mouth at bedtime as needed for anxiety. 30 tablet 2  . FLUoxetine (PROZAC) 40 MG capsule Take 1 capsule (40 mg total) by mouth daily. 90 capsule 3  . levothyroxine (SYNTHROID) 75 MCG tablet Take 75 mcg by mouth daily before breakfast.    . liothyronine (CYTOMEL) 5 MCG tablet Take 5 mcg by mouth daily.    . Magnesium 500 MG TABS Take by mouth.    . Multiple Vitamin (MULTIVITAMIN) tablet Take 1 tablet by mouth daily.    . traMADol (ULTRAM) 50 MG tablet Take 1  tablet (50 mg total) by mouth every 6 (six) hours as needed. 10 tablet 0  . dexamethasone (DECADRON) 4 MG tablet Take 1 tablet (4 mg total) by mouth 2 (two) times daily. Take 1 tablet day before chemo and 1 tablet 1 days after chemo with food 8 tablet 0  . lidocaine-prilocaine (EMLA) cream Apply to affected area once 30 g 3  . LORazepam (ATIVAN) 0.5 MG tablet Take 1 tablet (0.5 mg total) by mouth at bedtime as needed for sleep. 30 tablet 0  . ondansetron (ZOFRAN) 8 MG tablet Take 1 tablet (8 mg total) by mouth 2 (two) times daily as needed for refractory nausea / vomiting. Start on day 3 after chemo. 30 tablet 1  . prochlorperazine (COMPAZINE) 10 MG tablet Take 1 tablet (10 mg total) by mouth every 6 (six) hours as needed (Nausea or vomiting). 30 tablet 1    Results for orders placed or performed during the hospital encounter of 03/17/19 (from  the past 48 hour(s))  Pregnancy, urine POC     Status: None   Collection Time: 03/17/19  9:44 AM  Result Value Ref Range   Preg Test, Ur NEGATIVE NEGATIVE    Comment:        THE SENSITIVITY OF THIS METHODOLOGY IS >24 mIU/mL    No results found.  Review of Systems  All other systems reviewed and are negative.   Blood pressure 115/62, pulse 67, temperature 98.6 F (37 C), temperature source Oral, resp. rate 18, height 5\' 5"  (1.651 m), weight 67.3 kg, SpO2 100 %. Physical Exam  cv rrr Lungs clear No breast mass, healing incision  Assessment/Plan Stage I clinical right breast cancer Excision right breast margin, port placement, sn biopsy We discussed the staging and pathophysiology of breast cancer. We discussed all of the different options for treatment for breast cancer including surgery, chemotherapy, radiation therapy, Herceptin, and antiestrogen therapy. We discussed a sentinel lymph node biopsy as she does not appear to having lymph node involvement right now. We discussed the performance of that with injection of radioactive tracer. We discussed that there is a chance of having a positive node with a sentinel lymph node biopsy and we will await the permanent pathology to make any other first further decisions in terms of her treatment. We discussed up to a 5% risk lifetime of chronic shoulder pain as well as lymphedema associated with a sentinel lymph node biopsy. We discussed the options for treatment of the breast cancer which included lumpectomy versus a mastectomy. We discussed the performance of the lumpectomy with radioactive seed placement. We discussed a 5-10% chance of a positive margin requiring reexcision in the operating room. We also discussed that she will likely need radiation therapy if she undergoes lumpectomy. The breast cannot undergo more radiation therapy in the same breast after lumpectomy in the future. We discussed mastectomy and the postoperative care for that  as well. Mastectomy can be followed by reconstruction. The decision for lumpectomy vs mastectomy has no impact on decision for chemotherapy. Most mastectomy patients will not need radiation therapy. We discussed that there is no difference in her survival whether she undergoes lumpectomy with radiation therapy or antiestrogen therapy versus a mastectomy. There is also no real difference between her recurrence in the breast. We discussed the risks of operation including bleeding, infection, possible reoperation. She understands her further therapy will be based on what her stages at the time of her operation.   Rolm Bookbinder, MD 03/17/2019, 10:35 AM

## 2019-03-18 ENCOUNTER — Encounter: Payer: Self-pay | Admitting: *Deleted

## 2019-03-18 NOTE — Addendum Note (Signed)
Addendum  created 03/18/19 1150 by Tawni Millers, CRNA   Charge Capture section accepted

## 2019-03-19 ENCOUNTER — Encounter: Payer: Self-pay | Admitting: *Deleted

## 2019-03-19 LAB — SURGICAL PATHOLOGY

## 2019-03-23 ENCOUNTER — Encounter: Payer: Self-pay | Admitting: *Deleted

## 2019-03-31 ENCOUNTER — Inpatient Hospital Stay: Payer: Managed Care, Other (non HMO) | Attending: Hematology and Oncology

## 2019-03-31 ENCOUNTER — Other Ambulatory Visit: Payer: Self-pay

## 2019-03-31 DIAGNOSIS — Z5111 Encounter for antineoplastic chemotherapy: Secondary | ICD-10-CM | POA: Insufficient documentation

## 2019-03-31 DIAGNOSIS — Z170 Estrogen receptor positive status [ER+]: Secondary | ICD-10-CM | POA: Insufficient documentation

## 2019-03-31 DIAGNOSIS — Z79899 Other long term (current) drug therapy: Secondary | ICD-10-CM | POA: Insufficient documentation

## 2019-03-31 DIAGNOSIS — C50511 Malignant neoplasm of lower-outer quadrant of right female breast: Secondary | ICD-10-CM | POA: Insufficient documentation

## 2019-04-13 NOTE — Progress Notes (Signed)
 Patient Care Team: Patient, No Pcp Per as PCP - General (General Practice) Stuart, Dawn C, RN as Oncology Nurse Navigator Martini, Keisha N, RN as Oncology Nurse Navigator  DIAGNOSIS:    ICD-10-CM   1. Malignant neoplasm of lower-outer quadrant of right breast of female, estrogen receptor positive (HCC)  C50.511    Z17.0     SUMMARY OF ONCOLOGIC HISTORY: Oncology History  Malignant neoplasm of lower-outer quadrant of right breast of female, estrogen receptor positive (HCC)  01/15/2019 Initial Diagnosis   Screening mammogram detected an area of distortion in the lower central right breast with a possible associated mass. 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 Lumpectomy showed IDC.    02/10/2019 Surgery   Right lumpectomy ([facility]): IDC, grade 1, 0.8cm, involved lateral margin, HER-2 equivocal (2+) by IHC, negative by FISH, ER+ 50%, PR+ 50%, Ki67 10%   03/04/2019 Oncotype testing   Score of 24   03/09/2019 Genetic Testing   Negative genetic testing:  No pathogenic variants detected on the [facility] Breast Cancer STAT panel or the Common Hereditary Cancers panel. The report date is 03/09/19.  The Breast Cancer STAT panel offered by [facility] includes sequencing and rearrangement analysis for the following 9 genes:  ATM, BRCA1, BRCA2, CDH1, CHEK2, PALB2, PTEN, STK11 and TP53.  The Common Hereditary Cancers Panel offered by [facility] includes sequencing and/or deletion duplication testing of the following 48 genes: APC, ATM, AXIN2, BARD1, BMPR1A, BRCA1, BRCA2, BRIP1, CDH1, CDK4, CDKN2A (p14ARF), CDKN2A (p16INK4a), CHEK2, CTNNA1, DICER1, EPCAM (Deletion/duplication testing only), GREM1 (promoter region deletion/duplication testing only), KIT, MEN1, MLH1, MSH2, MSH3, MSH6, MUTYH, NBN, NF1, NHTL1, PALB2, PDGFRA, PMS2, POLD1, POLE, PTEN, RAD50, RAD51C, RAD51D, RNF43, SDHB, SDHC, SDHD, SMAD4, SMARCA4. STK11, TP53, TSC1, TSC2, and VHL.  The following genes were evaluated for sequence  changes only: SDHA and HOXB13 c.251G>A variant only.    03/17/2019 Surgery   Right lumpectomy ([facility]): no residual carcinoma and 5 right axillary lymph nodes negative   04/14/2019 -  Chemotherapy   The patient had palonosetron (ALOXI) injection 0.25 mg, 0.25 mg, Intravenous,  Once, 0 of 4 cycles pegfilgrastim-jmdb (FULPHILA) injection 6 mg, 6 mg, Subcutaneous,  Once, 0 of 4 cycles cyclophosphamide (CYTOXAN) 1,040 mg in sodium chloride 0.9 % 250 mL chemo infusion, 600 mg/m2 = 1,040 mg, Intravenous,  Once, 0 of 4 cycles DOCEtaxel (TAXOTERE) 130 mg in sodium chloride 0.9 % 250 mL chemo infusion, 75 mg/m2 = 130 mg, Intravenous,  Once, 0 of 4 cycles  for chemotherapy treatment.      CHIEF COMPLIANT: Cycle 1 Taxotere and Cytoxan  INTERVAL HISTORY: Terri Hoover is a 43 y.o. with above-mentioned history of right breast cancer who underwent a right lumpectomy on 03/17/19 with Dr. Wakefield for which pathology showed no residual carcinoma and 5 right axillary lymph nodes negative for carcinoma. She is currently on adjuvant chemotherapy with Taxotere and Cytoxan. She presents to the clinic today for cycle 1.  She took steroids yesterday and feels very hyped up today.  ALLERGIES:  has No Known Allergies.  MEDICATIONS:  Current Outpatient Medications  Medication Sig Dispense Refill  . ALPRAZolam (XANAX) 0.25 MG tablet Take 1 tablet (0.25 mg total) by mouth at bedtime as needed for anxiety. 30 tablet 2  . dexamethasone (DECADRON) 4 MG tablet Take 1 tablet (4 mg total) by mouth 2 (two) times daily. Take 1 tablet day before chemo and 1 tablet 1 days after chemo with food 8 tablet 0  . FLUoxetine (PROZAC)   40 MG capsule Take 1 capsule (40 mg total) by mouth daily. 90 capsule 3  . levothyroxine (SYNTHROID) 75 MCG tablet Take 75 mcg by mouth daily before breakfast.    . lidocaine-prilocaine (EMLA) cream Apply to affected area once 30 g 3  . liothyronine (CYTOMEL) 5 MCG tablet Take 5 mcg by mouth  daily.    . LORazepam (ATIVAN) 0.5 MG tablet Take 1 tablet (0.5 mg total) by mouth at bedtime as needed for sleep. 30 tablet 0  . Magnesium 500 MG TABS Take by mouth.    . Multiple Vitamin (MULTIVITAMIN) tablet Take 1 tablet by mouth daily.    . ondansetron (ZOFRAN) 8 MG tablet Take 1 tablet (8 mg total) by mouth 2 (two) times daily as needed for refractory nausea / vomiting. Start on day 3 after chemo. 30 tablet 1  . prochlorperazine (COMPAZINE) 10 MG tablet Take 1 tablet (10 mg total) by mouth every 6 (six) hours as needed (Nausea or vomiting). 30 tablet 1  . traMADol (ULTRAM) 50 MG tablet Take 1 tablet (50 mg total) by mouth every 6 (six) hours as needed. 10 tablet 0   No current facility-administered medications for this visit.    PHYSICAL EXAMINATION: ECOG PERFORMANCE STATUS: 1 - Symptomatic but completely ambulatory  Vitals:   04/14/19 1033  BP: 125/75  Pulse: 69  Resp: 16  Temp: 98.5 F (36.9 C)  SpO2: 99%   Filed Weights   04/14/19 1033  Weight: 151 lb 11.2 oz (68.8 kg)    LABORATORY DATA:  I have reviewed the data as listed CMP Latest Ref Rng & Units 10/14/2018 12/17/2014 12/02/2013  Glucose 65 - 99 mg/dL 87 71 76  BUN 7 - 25 mg/dL 18 13 16  Creatinine 0.50 - 1.10 mg/dL 0.81 0.68 0.89  Sodium 135 - 146 mmol/L 137 139 138  Potassium 3.5 - 5.3 mmol/L 4.7 4.4 4.3  Chloride 98 - 110 mmol/L 104 107 105  CO2 20 - 32 mmol/L 24 25 25  Calcium 8.6 - 10.2 mg/dL 9.4 8.9 9.1  Total Protein 6.1 - 8.1 g/dL 6.6 6.4 6.9  Total Bilirubin 0.2 - 1.2 mg/dL 0.5 1.3(H) 0.9  Alkaline Phos 33 - 115 U/L - 40 47  AST 10 - 30 U/L 17 13 16  ALT 6 - 29 U/L 15 9 11    Lab Results  Component Value Date   WBC 6.8 10/14/2018   HGB 14.3 10/14/2018   HCT 42.4 10/14/2018   MCV 89.3 10/14/2018   PLT 229 10/14/2018   NEUTROABS 4,869 10/14/2018    ASSESSMENT & PLAN:  Malignant neoplasm of lower-outer quadrant of right breast of female, estrogen receptor positive (HCC) 01/15/2019:screening  mammogram detected area of distortion in the lower central right breast with a possible associated mass. 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 She underwent a lumpectomy with Dr. Wakefield on 02/10/19 for which pathology showed invasive ductal carcinoma, grade 1, 0.8cm, involved lateral margin, HER-2 equivocal (2+) by IHC, negative by FISH, ER+ 50%, PR+ 50%, Ki67 10% Oncotype DX recurrence score 24: Distant recurrence at 9 years: 10% Patient still needs to undergo lumpectomy plus sentinel lymph node surgery versus bilateral mastectomies if she is BRCA mutation positive  Treatment plan:  1.  Adjuvant chemotherapy with Taxotere and Cytoxan every 3 weeks x4 cycles started 04/14/2019 2.  Adjuvant radiation therapy if she undergoes breast conserving surgery followed by 3.  Adjuvant antiestrogen therapy ---------------------------------------------------------------------------------------------------------------------------------------------------------------------- Current treatment: Cycle 1 day   1 Taxotere and Cytoxan Labs reviewed Chemo consent obtained chemo education completed Patient's insurance did not authorize pegfilgrastim.  They only authorized for Zarxio.  We will treat her with this treatment without the pegfilgrastim.  If she develops any complications like profound neutropenia or neutropenic fever then for the next treatment we will be able to send her a prescription for pegfilgrastim that can be picked up at her local pharmacy and self injected.  If she does well with this treatment in terms of nausea, we can discontinue steroids with the next round. Return to clinic in 1 week for toxicity check    No orders of the defined types were placed in this encounter.  The patient has a good understanding of the overall plan. she agrees with it. she will call with any problems that may develop before the next visit here.  Total time spent: 30 mins including face  to face time and time spent for planning, charting and coordination of care  Gudena, Vinay, MD 04/14/2019  I, Molly Dorshimer, am acting as scribe for Dr. Vinay Gudena.  I have reviewed the above documentation for accuracy and completeness, and I agree with the above.       

## 2019-04-14 ENCOUNTER — Inpatient Hospital Stay: Payer: Managed Care, Other (non HMO)

## 2019-04-14 ENCOUNTER — Encounter: Payer: Self-pay | Admitting: *Deleted

## 2019-04-14 ENCOUNTER — Inpatient Hospital Stay: Payer: Managed Care, Other (non HMO) | Admitting: Hematology and Oncology

## 2019-04-14 ENCOUNTER — Other Ambulatory Visit: Payer: Self-pay

## 2019-04-14 VITALS — BP 128/82 | HR 62 | Temp 98.2°F | Resp 17

## 2019-04-14 DIAGNOSIS — C50511 Malignant neoplasm of lower-outer quadrant of right female breast: Secondary | ICD-10-CM

## 2019-04-14 DIAGNOSIS — Z170 Estrogen receptor positive status [ER+]: Secondary | ICD-10-CM | POA: Diagnosis not present

## 2019-04-14 DIAGNOSIS — Z79899 Other long term (current) drug therapy: Secondary | ICD-10-CM | POA: Diagnosis not present

## 2019-04-14 DIAGNOSIS — Z95828 Presence of other vascular implants and grafts: Secondary | ICD-10-CM

## 2019-04-14 DIAGNOSIS — Z5111 Encounter for antineoplastic chemotherapy: Secondary | ICD-10-CM | POA: Diagnosis not present

## 2019-04-14 LAB — CBC WITH DIFFERENTIAL (CANCER CENTER ONLY)
Abs Immature Granulocytes: 0.02 10*3/uL (ref 0.00–0.07)
Basophils Absolute: 0 10*3/uL (ref 0.0–0.1)
Basophils Relative: 1 %
Eosinophils Absolute: 0.2 10*3/uL (ref 0.0–0.5)
Eosinophils Relative: 2 %
HCT: 40 % (ref 36.0–46.0)
Hemoglobin: 13.2 g/dL (ref 12.0–15.0)
Immature Granulocytes: 0 %
Lymphocytes Relative: 30 %
Lymphs Abs: 2.4 10*3/uL (ref 0.7–4.0)
MCH: 29.6 pg (ref 26.0–34.0)
MCHC: 33 g/dL (ref 30.0–36.0)
MCV: 89.7 fL (ref 80.0–100.0)
Monocytes Absolute: 0.6 10*3/uL (ref 0.1–1.0)
Monocytes Relative: 8 %
Neutro Abs: 4.7 10*3/uL (ref 1.7–7.7)
Neutrophils Relative %: 59 %
Platelet Count: 186 10*3/uL (ref 150–400)
RBC: 4.46 MIL/uL (ref 3.87–5.11)
RDW: 12.2 % (ref 11.5–15.5)
WBC Count: 7.9 10*3/uL (ref 4.0–10.5)
nRBC: 0 % (ref 0.0–0.2)

## 2019-04-14 LAB — CMP (CANCER CENTER ONLY)
ALT: 20 U/L (ref 0–44)
AST: 18 U/L (ref 15–41)
Albumin: 4 g/dL (ref 3.5–5.0)
Alkaline Phosphatase: 62 U/L (ref 38–126)
Anion gap: 8 (ref 5–15)
BUN: 16 mg/dL (ref 6–20)
CO2: 26 mmol/L (ref 22–32)
Calcium: 8.5 mg/dL — ABNORMAL LOW (ref 8.9–10.3)
Chloride: 107 mmol/L (ref 98–111)
Creatinine: 0.75 mg/dL (ref 0.44–1.00)
GFR, Est AFR Am: >60 mL/min (ref >60)
GFR, Estimated: >60 mL/min (ref >60)
Glucose, Bld: 86 mg/dL (ref 70–99)
Potassium: 3.5 mmol/L (ref 3.5–5.1)
Sodium: 141 mmol/L (ref 135–145)
Total Bilirubin: 0.7 mg/dL (ref 0.3–1.2)
Total Protein: 6.7 g/dL (ref 6.5–8.1)

## 2019-04-14 MED ORDER — SODIUM CHLORIDE 0.9% FLUSH
10.0000 mL | INTRAVENOUS | Status: DC | PRN
  Administered 2019-04-14: 10 mL via INTRAVENOUS
  Filled 2019-04-14: qty 10

## 2019-04-14 MED ORDER — SODIUM CHLORIDE 0.9% FLUSH
10.0000 mL | INTRAVENOUS | Status: DC | PRN
  Administered 2019-04-14: 10 mL
  Filled 2019-04-14: qty 10

## 2019-04-14 MED ORDER — PALONOSETRON HCL INJECTION 0.25 MG/5ML
0.2500 mg | Freq: Once | INTRAVENOUS | Status: AC
  Administered 2019-04-14: 0.25 mg via INTRAVENOUS

## 2019-04-14 MED ORDER — DEXAMETHASONE SODIUM PHOSPHATE 10 MG/ML IJ SOLN
10.0000 mg | Freq: Once | INTRAMUSCULAR | Status: AC
  Administered 2019-04-14: 10 mg via INTRAVENOUS

## 2019-04-14 MED ORDER — MAGNESIUM OXIDE 400 (241.3 MG) MG PO TABS: 400.0000 mg | ORAL_TABLET | Freq: Every day | ORAL | Status: DC

## 2019-04-14 MED ORDER — DEXAMETHASONE SODIUM PHOSPHATE 10 MG/ML IJ SOLN
INTRAMUSCULAR | Status: AC
  Filled 2019-04-14: qty 1

## 2019-04-14 MED ORDER — HEPARIN SOD (PORK) LOCK FLUSH 100 UNIT/ML IV SOLN
500.0000 [IU] | Freq: Once | INTRAVENOUS | Status: AC | PRN
  Administered 2019-04-14: 500 [IU]
  Filled 2019-04-14: qty 5

## 2019-04-14 MED ORDER — SODIUM CHLORIDE 0.9 % IV SOLN
75.0000 mg/m2 | Freq: Once | INTRAVENOUS | Status: AC
  Administered 2019-04-14: 130 mg via INTRAVENOUS
  Filled 2019-04-14: qty 13

## 2019-04-14 MED ORDER — SODIUM CHLORIDE 0.9 % IV SOLN
600.0000 mg/m2 | Freq: Once | INTRAVENOUS | Status: AC
  Administered 2019-04-14: 1040 mg via INTRAVENOUS
  Filled 2019-04-14: qty 52

## 2019-04-14 MED ORDER — PALONOSETRON HCL INJECTION 0.25 MG/5ML
INTRAVENOUS | Status: AC
  Filled 2019-04-14: qty 5

## 2019-04-14 MED ORDER — SODIUM CHLORIDE 0.9 % IV SOLN
Freq: Once | INTRAVENOUS | Status: AC
  Filled 2019-04-14: qty 250

## 2019-04-14 NOTE — Patient Instructions (Signed)

## 2019-04-14 NOTE — Patient Instructions (Addendum)
[facility] Discharge Instructions for Patients Receiving Chemotherapy  Today you received the following chemotherapy agents Taxotere and Cytoxan   To help prevent nausea and vomiting after your treatment, we encourage you to take your nausea medication as directed.    If you develop nausea and vomiting that is not controlled by your nausea medication, call the clinic.   BELOW ARE SYMPTOMS THAT SHOULD BE REPORTED IMMEDIATELY:  *FEVER GREATER THAN 100.5 F  *CHILLS WITH OR WITHOUT FEVER  NAUSEA AND VOMITING THAT IS NOT CONTROLLED WITH YOUR NAUSEA MEDICATION  *UNUSUAL SHORTNESS OF BREATH  *UNUSUAL BRUISING OR BLEEDING  TENDERNESS IN MOUTH AND THROAT WITH OR WITHOUT PRESENCE OF ULCERS  *URINARY PROBLEMS  *BOWEL PROBLEMS  UNUSUAL RASH Items with * indicate a potential emergency and should be followed up as soon as possible.  Feel free to call the clinic should you have any questions or concerns. The clinic phone number is [phone].  Please show the [place] at check-in to the Emergency Department and triage nurse.  Docetaxel injection What is this medicine? DOCETAXEL (doe se TAX el) is a chemotherapy drug. It targets fast dividing cells, like cancer cells, and causes these cells to die. This medicine is used to treat many types of cancers like breast cancer, certain stomach cancers, head and neck cancer, lung cancer, and prostate cancer. This medicine may be used for other purposes; ask your health care provider or pharmacist if you have questions. COMMON BRAND NAME(S): Docefrez, Taxotere What should I tell my health care provider before I take this medicine? They need to know if you have any of these conditions:  infection (especially a virus infection such as chickenpox, cold sores, or herpes)  liver disease  low blood counts, like low white cell, platelet, or red cell counts  an unusual or allergic reaction to docetaxel, polysorbate 80, other  chemotherapy agents, other medicines, foods, dyes, or preservatives  pregnant or trying to get pregnant  breast-feeding How should I use this medicine? This drug is given as an infusion into a vein. It is administered in a hospital or clinic by a specially trained health care professional. Talk to your pediatrician regarding the use of this medicine in children. Special care may be needed. Overdosage: If you think you have taken too much of this medicine contact a poison control center or emergency room at once. NOTE: This medicine is only for you. Do not share this medicine with others. What if I miss a dose? It is important not to miss your dose. Call your doctor or health care professional if you are unable to keep an appointment. What may interact with this medicine?  aprepitant  certain antibiotics like erythromycin or clarithromycin  certain antivirals for HIV or hepatitis  certain medicines for fungal infections like fluconazole, itraconazole, ketoconazole, posaconazole, or voriconazole  cimetidine  ciprofloxacin  conivaptan  cyclosporine  dronedarone  fluvoxamine  grapefruit juice  imatinib  verapamil This list may not describe all possible interactions. Give your health care provider a list of all the medicines, herbs, non-prescription drugs, or dietary supplements you use. Also tell them if you smoke, drink alcohol, or use illegal drugs. Some items may interact with your medicine. What should I watch for while using this medicine? Your condition will be monitored carefully while you are receiving this medicine. You will need important blood work done while you are taking this medicine. Call your doctor or health care professional for advice if you get  a fever, chills or sore throat, or other symptoms of a cold or flu. Do not treat yourself. This drug decreases your body's ability to fight infections. Try to avoid being around people who are sick. Some products may  contain alcohol. Ask your health care professional if this medicine contains alcohol. Be sure to tell all health care professionals you are taking this medicine. Certain medicines, like metronidazole and disulfiram, can cause an unpleasant reaction when taken with alcohol. The reaction includes flushing, headache, nausea, vomiting, sweating, and increased thirst. The reaction can last from 30 minutes to several hours. You may get drowsy or dizzy. Do not drive, use machinery, or do anything that needs mental alertness until you know how this medicine affects you. Do not stand or sit up quickly, especially if you are an older patient. This reduces the risk of dizzy or fainting spells. Alcohol may interfere with the effect of this medicine. Talk to your health care professional about your risk of cancer. You may be more at risk for certain types of cancer if you take this medicine. Do not become pregnant while taking this medicine or for 6 months after stopping it. Women should inform their doctor if they wish to become pregnant or think they might be pregnant. There is a potential for serious side effects to an unborn child. Talk to your health care professional or pharmacist for more information. Do not breast-feed an infant while taking this medicine or for 1 week after stopping it. Males who get this medicine must use a condom during sex with females who can get pregnant. If you get a woman pregnant, the baby could have birth defects. The baby could die before they are born. You will need to continue wearing a condom for 3 months after stopping the medicine. Tell your health care provider right away if your partner becomes pregnant while you are taking this medicine. This may interfere with the ability to father a child. You should talk to your doctor or health care professional if you are concerned about your fertility. What side effects may I notice from receiving this medicine? Side effects that you  should report to your doctor or health care professional as soon as possible:  allergic reactions like skin rash, itching or hives, swelling of the face, lips, or tongue  blurred vision  breathing problems  changes in vision  low blood counts - This drug may decrease the number of white blood cells, red blood cells and platelets. You may be at increased risk for infections and bleeding.  nausea and vomiting  pain, redness or irritation at site where injected  pain, tingling, numbness in the hands or feet  redness, blistering, peeling, or loosening of the skin, including inside the mouth  signs of decreased platelets or bleeding - bruising, pinpoint red spots on the skin, black, tarry stools, nosebleeds  signs of decreased red blood cells - unusually weak or tired, fainting spells, lightheadedness  signs of infection - fever or chills, cough, sore throat, pain or difficulty passing urine  swelling of the ankle, feet, hands Side effects that usually do not require medical attention (report to your doctor or health care professional if they continue or are bothersome):  constipation  diarrhea  fingernail or toenail changes  hair loss  loss of appetite  mouth sores  muscle pain This list may not describe all possible side effects. Call your doctor for medical advice about side effects. You may report side effects to FDA  at 1-800-FDA-1088. Where should I keep my medicine? This drug is given in a hospital or clinic and will not be stored at home. NOTE: This sheet is a summary. It may not cover all possible information. If you have questions about this medicine, talk to your doctor, pharmacist, or health care provider.  2020 Elsevier/Gold Standard (2018-10-09 10:19:06)  Cyclophosphamide Injection What is this medicine? CYCLOPHOSPHAMIDE (sye kloe FOSS fa mide) is a chemotherapy drug. It slows the growth of cancer cells. This medicine is used to treat many types of cancer  like lymphoma, myeloma, leukemia, breast cancer, and ovarian cancer, to name a few. This medicine may be used for other purposes; ask your health care provider or pharmacist if you have questions. COMMON BRAND NAME(S): Cytoxan, Neosar What should I tell my health care provider before I take this medicine? They need to know if you have any of these conditions:  heart disease  history of irregular heartbeat  infection  kidney disease  liver disease  low blood counts, like white cells, platelets, or red blood cells  on hemodialysis  recent or ongoing radiation therapy  scarring or thickening of the lungs  trouble passing urine  an unusual or allergic reaction to cyclophosphamide, other medicines, foods, dyes, or preservatives  pregnant or trying to get pregnant  breast-feeding How should I use this medicine? This drug is usually given as an injection into a vein or muscle or by infusion into a vein. It is administered in a hospital or clinic by a specially trained health care professional. Talk to your pediatrician regarding the use of this medicine in children. Special care may be needed. Overdosage: If you think you have taken too much of this medicine contact a poison control center or emergency room at once. NOTE: This medicine is only for you. Do not share this medicine with others. What if I miss a dose? It is important not to miss your dose. Call your doctor or health care professional if you are unable to keep an appointment. What may interact with this medicine?  amphotericin B  azathioprine  certain antivirals for HIV or hepatitis  certain medicines for blood pressure, heart disease, irregular heart beat  certain medicines that treat or prevent blood clots like warfarin  certain other medicines for cancer  cyclosporine  etanercept  indomethacin  medicines that relax muscles for surgery  medicines to increase blood counts  metronidazole This list  may not describe all possible interactions. Give your health care provider a list of all the medicines, herbs, non-prescription drugs, or dietary supplements you use. Also tell them if you smoke, drink alcohol, or use illegal drugs. Some items may interact with your medicine. What should I watch for while using this medicine? Your condition will be monitored carefully while you are receiving this medicine. You may need blood work done while you are taking this medicine. Drink water or other fluids as directed. Urinate often, even at night. Some products may contain alcohol. Ask your health care professional if this medicine contains alcohol. Be sure to tell all health care professionals you are taking this medicine. Certain medicines, like metronidazole and disulfiram, can cause an unpleasant reaction when taken with alcohol. The reaction includes flushing, headache, nausea, vomiting, sweating, and increased thirst. The reaction can last from 30 minutes to several hours. Do not become pregnant while taking this medicine or for 1 year after stopping it. Women should inform their health care professional if they wish to become pregnant or  think they might be pregnant. Men should not father a child while taking this medicine and for 4 months after stopping it. There is potential for serious side effects to an unborn child. Talk to your health care professional for more information. Do not breast-feed an infant while taking this medicine or for 1 week after stopping it. This medicine has caused ovarian failure in some women. This medicine may make it more difficult to get pregnant. Talk to your health care professional if you are concerned about your fertility. This medicine has caused decreased sperm counts in some men. This may make it more difficult to father a child. Talk to your health care professional if you are concerned about your fertility. Call your health care professional for advice if you get a  fever, chills, or sore throat, or other symptoms of a cold or flu. Do not treat yourself. This medicine decreases your body's ability to fight infections. Try to avoid being around people who are sick. Avoid taking medicines that contain aspirin, acetaminophen, ibuprofen, naproxen, or ketoprofen unless instructed by your health care professional. These medicines may hide a fever. Talk to your health care professional about your risk of cancer. You may be more at risk for certain types of cancer if you take this medicine. If you are going to need surgery or other procedure, tell your health care professional that you are using this medicine. Be careful brushing or flossing your teeth or using a toothpick because you may get an infection or bleed more easily. If you have any dental work done, tell your dentist you are receiving this medicine. What side effects may I notice from receiving this medicine? Side effects that you should report to your doctor or health care professional as soon as possible:  allergic reactions like skin rash, itching or hives, swelling of the face, lips, or tongue  breathing problems  nausea, vomiting  signs and symptoms of bleeding such as bloody or black, tarry stools; red or dark brown urine; spitting up blood or brown material that looks like coffee grounds; red spots on the skin; unusual bruising or bleeding from the eyes, gums, or nose  signs and symptoms of heart failure like fast, irregular heartbeat, sudden weight gain; swelling of the ankles, feet, hands  signs and symptoms of infection like fever; chills; cough; sore throat; pain or trouble passing urine  signs and symptoms of kidney injury like trouble passing urine or change in the amount of urine  signs and symptoms of liver injury like dark yellow or brown urine; general ill feeling or flu-like symptoms; light-colored stools; loss of appetite; nausea; right upper belly pain; unusually weak or tired;  yellowing of the eyes or skin Side effects that usually do not require medical attention (report to your doctor or health care professional if they continue or are bothersome):  confusion  decreased hearing  diarrhea  facial flushing  hair loss  headache  loss of appetite  missed menstrual periods  signs and symptoms of low red blood cells or anemia such as unusually weak or tired; feeling faint or lightheaded; falls  skin discoloration This list may not describe all possible side effects. Call your doctor for medical advice about side effects. You may report side effects to FDA at 1-800-FDA-1088. Where should I keep my medicine? This drug is given in a hospital or clinic and will not be stored at home. NOTE: This sheet is a summary. It may not cover all possible information. If  you have questions about this medicine, talk to your doctor, pharmacist, or health care provider.  2020 Elsevier/Gold Standard (2018-11-17 09:53:29)

## 2019-04-14 NOTE — Assessment & Plan Note (Signed)
01/15/2019:screening mammogram detected area of distortion in the lower central right breast with a possible associated mass. 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 She underwent a lumpectomy with Dr. 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  Adjuvant chemotherapy with Taxotere and Cytoxan every 3 weeks x4 cycles started 04/14/2019 2.  Adjuvant radiation therapy if she undergoes breast conserving surgery followed by 3.  Adjuvant antiestrogen therapy ---------------------------------------------------------------------------------------------------------------------------------------------------------------------- Current treatment: Cycle 1 day 1 Taxotere and Cytoxan Labs reviewed Chemo consent obtained chemo education completed Return to clinic in 1 week for toxicity check

## 2019-04-15 ENCOUNTER — Telehealth: Payer: Self-pay | Admitting: *Deleted

## 2019-04-16 ENCOUNTER — Ambulatory Visit: Payer: Managed Care, Other (non HMO)

## 2019-04-20 ENCOUNTER — Other Ambulatory Visit: Payer: Self-pay | Admitting: *Deleted

## 2019-04-20 MED ORDER — PEGFILGRASTIM INJECTION 6 MG/0.6ML ~~LOC~~: 6.0000 mg | PREFILLED_SYRINGE | Freq: Once | SUBCUTANEOUS | 1 refills | Status: AC

## 2019-04-20 NOTE — Progress Notes (Signed)
Patient Care Team: Patient, No Pcp Per as PCP - General (General Practice) Mauro Kaufmann, RN as Oncology Nurse Navigator Rockwell Germany, RN as Oncology Nurse Navigator  DIAGNOSIS:    ICD-10-CM   1. Malignant neoplasm of lower-outer quadrant of right breast of female, estrogen receptor positive ([place])  C50.511    Z17.0     SUMMARY OF ONCOLOGIC HISTORY: Oncology History  Malignant neoplasm of lower-outer quadrant of right breast of female, estrogen receptor positive ([place])  01/15/2019 Initial Diagnosis   Screening mammogram detected an area of distortion in the lower central right breast with a possible associated mass. 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 Lumpectomy showed IDC.    02/10/2019 Surgery   Right lumpectomy [name]): IDC, grade 1, 0.8cm, involved lateral margin, HER-2 equivocal (2+) by IHC, negative by FISH, ER+ 50%, PR+ 50%, Ki67 10%   03/04/2019 Oncotype testing   Score of 24   03/09/2019 Genetic Testing   Negative genetic testing:  No pathogenic variants detected on the [facility] Breast Cancer STAT panel or the Common Hereditary Cancers panel. The report date is 03/09/19.  The Breast Cancer STAT panel offered by [facility] includes sequencing and rearrangement analysis for the following 9 genes:  ATM, BRCA1, BRCA2, CDH1, CHEK2, PALB2, PTEN, STK11 and TP53.  The Common Hereditary Cancers Panel offered by [facility] includes sequencing and/or deletion duplication testing of the following 48 genes: APC, ATM, AXIN2, BARD1, BMPR1A, BRCA1, BRCA2, BRIP1, CDH1, CDK4, CDKN2A (p14ARF), CDKN2A (p16INK4a), CHEK2, CTNNA1, DICER1, EPCAM (Deletion/duplication testing only), GREM1 (promoter region deletion/duplication testing only), KIT, MEN1, MLH1, MSH2, MSH3, MSH6, MUTYH, NBN, NF1, NHTL1, PALB2, PDGFRA, PMS2, POLD1, POLE, PTEN, RAD50, RAD51C, RAD51D, RNF43, SDHB, SDHC, SDHD, SMAD4, SMARCA4. STK11, TP53, TSC1, TSC2, and VHL.  The following genes were evaluated for sequence  changes only: SDHA and HOXB13 c.251G>A variant only.    03/17/2019 Surgery   Right lumpectomy [name]): no residual carcinoma and 5 right axillary lymph nodes negative   04/14/2019 -  Chemotherapy   The patient had palonosetron (ALOXI) injection 0.25 mg, 0.25 mg, Intravenous,  Once, 1 of 4 cycles Administration: 0.25 mg (04/14/2019) pegfilgrastim (NEULASTA ONPRO KIT) injection 6 mg, 6 mg, Subcutaneous, Once, 0 of 1 cycle cyclophosphamide (CYTOXAN) 1,040 mg in sodium chloride 0.9 % 250 mL chemo infusion, 600 mg/m2 = 1,040 mg, Intravenous,  Once, 1 of 4 cycles Administration: 1,040 mg (04/14/2019) DOCEtaxel (TAXOTERE) 130 mg in sodium chloride 0.9 % 250 mL chemo infusion, 75 mg/m2 = 130 mg, Intravenous,  Once, 1 of 4 cycles Administration: 130 mg (04/14/2019)  for chemotherapy treatment.      CHIEF COMPLIANT: Cycle 1 Day 8 Taxotere and Cytoxan  INTERVAL HISTORY: Terri Hoover is a 44 y.o. with above-mentioned history of right breast cancer who underwent a right lumpectomy and is currently on adjuvant chemotherapy with Taxotere and Cytoxan. She presents to the clinic today for a toxicity check following cycle 1.  Overall she tolerated chemotherapy extremely well.  She did not have any nausea or vomiting.  She had slight change in taste.  She felt achy for couple of days.  Denies any fevers or chills.  Denies any constipation or diarrhea.  She has not taken much of nausea medication either.  ALLERGIES:  has No Known Allergies.  MEDICATIONS:  Current Outpatient Medications  Medication Sig Dispense Refill  . ALPRAZolam (XANAX) 0.25 MG tablet Take 1 tablet (0.25 mg total) by mouth at bedtime as needed for anxiety. 30 tablet 2  .  dexamethasone (DECADRON) 4 MG tablet Take 1 tablet (4 mg total) by mouth 2 (two) times daily. Take 1 tablet day before chemo and 1 tablet 1 days after chemo with food 8 tablet 0  . FLUoxetine (PROZAC) 40 MG capsule Take 1 capsule (40 mg total) by mouth daily. 90  capsule 3  . levothyroxine (SYNTHROID) 75 MCG tablet Take 75 mcg by mouth daily before breakfast.    . lidocaine-prilocaine (EMLA) cream Apply to affected area once 30 g 3  . liothyronine (CYTOMEL) 5 MCG tablet Take 5 mcg by mouth daily.    Marland Kitchen LORazepam (ATIVAN) 0.5 MG tablet Take 1 tablet (0.5 mg total) by mouth at bedtime as needed for sleep. 30 tablet 0  . Magnesium 500 MG TABS Take by mouth.    . magnesium oxide (MAG-OX) 400 (241.3 Mg) MG tablet Take 1 tablet (400 mg total) by mouth daily.    . Multiple Vitamin (MULTIVITAMIN) tablet Take 1 tablet by mouth daily.    . ondansetron (ZOFRAN) 8 MG tablet Take 1 tablet (8 mg total) by mouth 2 (two) times daily as needed for refractory nausea / vomiting. Start on day 3 after chemo. 30 tablet 1  . prochlorperazine (COMPAZINE) 10 MG tablet Take 1 tablet (10 mg total) by mouth every 6 (six) hours as needed (Nausea or vomiting). 30 tablet 1  . traMADol (ULTRAM) 50 MG tablet Take 1 tablet (50 mg total) by mouth every 6 (six) hours as needed. 10 tablet 0   No current facility-administered medications for this visit.    PHYSICAL EXAMINATION: ECOG PERFORMANCE STATUS: 1 - Symptomatic but completely ambulatory  Vitals:   04/21/19 1339  BP: 138/85  Pulse: 82  Resp: 20  Temp: 98 F (36.7 C)  SpO2: 100%   Filed Weights   04/21/19 1339  Weight: 150 lb 11.2 oz (68.4 kg)    LABORATORY DATA:  I have reviewed the data as listed CMP Latest Ref Rng & Units 04/21/2019 04/14/2019 10/14/2018  Glucose 70 - 99 mg/dL 112(H) 86 87  BUN 6 - 20 mg/dL 11 16 18   Creatinine 0.44 - 1.00 mg/dL 0.75 0.75 0.81  Sodium 135 - 145 mmol/L 141 141 137  Potassium 3.5 - 5.1 mmol/L 4.1 3.5 4.7  Chloride 98 - 111 mmol/L 106 107 104  CO2 22 - 32 mmol/L 25 26 24   Calcium 8.9 - 10.3 mg/dL 8.7(L) 8.5(L) 9.4  Total Protein 6.5 - 8.1 g/dL 7.0 6.7 6.6  Total Bilirubin 0.3 - 1.2 mg/dL 0.6 0.7 0.5  Alkaline Phos 38 - 126 U/L 77 62 -  AST 15 - 41 U/L 53(H) 18 17  ALT 0 - 44 U/L  85(H) 20 15    Lab Results  Component Value Date   WBC 1.3 (L) 04/21/2019   HGB 13.4 04/21/2019   HCT 41.1 04/21/2019   MCV 90.9 04/21/2019   PLT 150 04/21/2019   NEUTROABS 0.4 (LL) 04/21/2019    ASSESSMENT & PLAN:  Malignant neoplasm of lower-outer quadrant of right breast of female, estrogen receptor positive ([place]) 01/15/2019:screening mammogram detected area of distortion in the lower central right breast with a possible associated mass. 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 She underwent a lumpectomy with Dr. 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 Adjuvant radiation therapy if she undergoes breast conserving surgeryfollowed by 3. Adjuvant antiestrogen therapy ---------------------------------------------------------------------------------------------------------------------------------------------------------------------- Current treatment: Cycle 1 day 8 Taxotere and Cytoxan Chemo toxicities: 1.  ANC 0.4 today: I recommended neutropenic precautions and we will reduce the dosage of next cycle of chemotherapy If her white count improves to baseline levels and we can skip pegfilgrastim completely.  However if her white count is at the low level then we will administer onPro which was approved for the next treatment.  Return to clinic in 2 weeks for cycle 2.    No orders of the defined types were placed in this encounter.  The patient has a good understanding of the overall plan. she  agrees with it. she will call with any problems that may develop before the next visit here.  Total time spent: 30 mins including face to face time and time spent for planning, charting and coordination of care  Nicholas Lose, MD 04/21/2019  I, Cloyde Reams Dorshimer, am acting as scribe for Dr. Nicholas Lose.  I have reviewed the above documentation for accuracy and completeness, and I agree with the above.

## 2019-04-21 ENCOUNTER — Encounter: Payer: Self-pay | Admitting: Hematology and Oncology

## 2019-04-21 ENCOUNTER — Other Ambulatory Visit: Payer: Self-pay

## 2019-04-21 ENCOUNTER — Encounter: Payer: Self-pay | Admitting: *Deleted

## 2019-04-21 ENCOUNTER — Inpatient Hospital Stay (HOSPITAL_BASED_OUTPATIENT_CLINIC_OR_DEPARTMENT_OTHER): Payer: Managed Care, Other (non HMO) | Admitting: Hematology and Oncology

## 2019-04-21 ENCOUNTER — Inpatient Hospital Stay: Payer: Managed Care, Other (non HMO)

## 2019-04-21 DIAGNOSIS — Z170 Estrogen receptor positive status [ER+]: Secondary | ICD-10-CM

## 2019-04-21 DIAGNOSIS — C50511 Malignant neoplasm of lower-outer quadrant of right female breast: Secondary | ICD-10-CM | POA: Diagnosis not present

## 2019-04-21 DIAGNOSIS — Z5111 Encounter for antineoplastic chemotherapy: Secondary | ICD-10-CM | POA: Diagnosis not present

## 2019-04-21 LAB — CMP (CANCER CENTER ONLY)
ALT: 85 U/L — ABNORMAL HIGH (ref 0–44)
AST: 53 U/L — ABNORMAL HIGH (ref 15–41)
Albumin: 4 g/dL (ref 3.5–5.0)
Alkaline Phosphatase: 77 U/L (ref 38–126)
Anion gap: 10 (ref 5–15)
BUN: 11 mg/dL (ref 6–20)
CO2: 25 mmol/L (ref 22–32)
Calcium: 8.7 mg/dL — ABNORMAL LOW (ref 8.9–10.3)
Chloride: 106 mmol/L (ref 98–111)
Creatinine: 0.75 mg/dL (ref 0.44–1.00)
GFR, Est AFR Am: >60 mL/min (ref >60)
GFR, Estimated: >60 mL/min (ref >60)
Glucose, Bld: 112 mg/dL — ABNORMAL HIGH (ref 70–99)
Potassium: 4.1 mmol/L (ref 3.5–5.1)
Sodium: 141 mmol/L (ref 135–145)
Total Bilirubin: 0.6 mg/dL (ref 0.3–1.2)
Total Protein: 7 g/dL (ref 6.5–8.1)

## 2019-04-21 LAB — CBC WITH DIFFERENTIAL (CANCER CENTER ONLY)
Abs Immature Granulocytes: 0 10*3/uL (ref 0.00–0.07)
Basophils Absolute: 0 10*3/uL (ref 0.0–0.1)
Basophils Relative: 2 %
Eosinophils Absolute: 0 10*3/uL (ref 0.0–0.5)
Eosinophils Relative: 2 %
HCT: 41.1 % (ref 36.0–46.0)
Hemoglobin: 13.4 g/dL (ref 12.0–15.0)
Immature Granulocytes: 0 %
Lymphocytes Relative: 58 %
Lymphs Abs: 0.8 10*3/uL (ref 0.7–4.0)
MCH: 29.6 pg (ref 26.0–34.0)
MCHC: 32.6 g/dL (ref 30.0–36.0)
MCV: 90.9 fL (ref 80.0–100.0)
Monocytes Absolute: 0.1 10*3/uL (ref 0.1–1.0)
Monocytes Relative: 6 %
Neutro Abs: 0.4 10*3/uL — CL (ref 1.7–7.7)
Neutrophils Relative %: 32 %
Platelet Count: 150 10*3/uL (ref 150–400)
RBC: 4.52 MIL/uL (ref 3.87–5.11)
RDW: 11.9 % (ref 11.5–15.5)
WBC Count: 1.3 10*3/uL — ABNORMAL LOW (ref 4.0–10.5)
nRBC: 0 % (ref 0.0–0.2)

## 2019-04-21 NOTE — Progress Notes (Signed)
CRITICAL VALUE ALERT  Critical Value:  ANC 0.4  Date & Time Notied:  04/21/19 at 1333  Provider Notified: Nicholas Lose, MD  Orders Received/Actions taken: MD notified

## 2019-04-21 NOTE — Assessment & Plan Note (Signed)
01/15/2019:screening mammogram detected area of distortion in the lower central right breast with a possible associated mass. 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 She underwent a lumpectomy with Dr. 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 Adjuvant radiation therapy if she undergoes breast conserving surgeryfollowed by 3. Adjuvant antiestrogen therapy ---------------------------------------------------------------------------------------------------------------------------------------------------------------------- Current treatment: Cycle 1 day 8 Taxotere and Cytoxan Chemo toxicities:  Patient's insurance did not approve pegfilgrastim.  We will consider Neulasta with the next cycle depending on her blood work. Return to clinic in 2 weeks for cycle 2.

## 2019-04-21 NOTE — Progress Notes (Signed)
Met with patient and spouse at registration to introduce myself as Arboriculturist and to offer available resources.  Discussed one-time $1000 Radio broadcast assistant to assist with personal expenses while going through treatment. Also discussed available copay assistance if needed for treatment drugs. Patient's spouse states he is sure the OOP has been met once everything prior has been processed.  Gave my card for any additional financial questions or concerns.

## 2019-05-04 NOTE — Progress Notes (Signed)
Patient Care Team: Patient, No Pcp Per as PCP - General (General Practice) Mauro Kaufmann, RN as Oncology Nurse Navigator Rockwell Germany, RN as Oncology Nurse Navigator  DIAGNOSIS:    ICD-10-CM   1. Malignant neoplasm of lower-outer quadrant of right breast of female, estrogen receptor positive ([place])  C50.511    Z17.0     SUMMARY OF ONCOLOGIC HISTORY: Oncology History  Malignant neoplasm of lower-outer quadrant of right breast of female, estrogen receptor positive ([place])  01/15/2019 Initial Diagnosis   Screening mammogram detected an area of distortion in the lower central right breast with a possible associated mass. 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 Lumpectomy showed IDC.    02/10/2019 Surgery   Right lumpectomy [name]): IDC, grade 1, 0.8cm, involved lateral margin, HER-2 equivocal (2+) by IHC, negative by FISH, ER+ 50%, PR+ 50%, Ki67 10%   03/04/2019 Oncotype testing   Score of 24   03/09/2019 Genetic Testing   Negative genetic testing:  No pathogenic variants detected on the [facility] Breast Cancer STAT panel or the Common Hereditary Cancers panel. The report date is 03/09/19.  The Breast Cancer STAT panel offered by [facility] includes sequencing and rearrangement analysis for the following 9 genes:  ATM, BRCA1, BRCA2, CDH1, CHEK2, PALB2, PTEN, STK11 and TP53.  The Common Hereditary Cancers Panel offered by [facility] includes sequencing and/or deletion duplication testing of the following 48 genes: APC, ATM, AXIN2, BARD1, BMPR1A, BRCA1, BRCA2, BRIP1, CDH1, CDK4, CDKN2A (p14ARF), CDKN2A (p16INK4a), CHEK2, CTNNA1, DICER1, EPCAM (Deletion/duplication testing only), GREM1 (promoter region deletion/duplication testing only), KIT, MEN1, MLH1, MSH2, MSH3, MSH6, MUTYH, NBN, NF1, NHTL1, PALB2, PDGFRA, PMS2, POLD1, POLE, PTEN, RAD50, RAD51C, RAD51D, RNF43, SDHB, SDHC, SDHD, SMAD4, SMARCA4. STK11, TP53, TSC1, TSC2, and VHL.  The following genes were evaluated for sequence  changes only: SDHA and HOXB13 c.251G>A variant only.    03/17/2019 Surgery   Right lumpectomy [name]): no residual carcinoma and 5 right axillary lymph nodes negative   04/14/2019 -  Chemotherapy   The patient had palonosetron (ALOXI) injection 0.25 mg, 0.25 mg, Intravenous,  Once, 1 of 4 cycles Administration: 0.25 mg (04/14/2019) cyclophosphamide (CYTOXAN) 1,040 mg in sodium chloride 0.9 % 250 mL chemo infusion, 600 mg/m2 = 1,040 mg, Intravenous,  Once, 1 of 4 cycles Dose modification: 500 mg/m2 (original dose 600 mg/m2, Cycle 2, Reason: Provider Judgment) Administration: 1,040 mg (04/14/2019) DOCEtaxel (TAXOTERE) 130 mg in sodium chloride 0.9 % 250 mL chemo infusion, 75 mg/m2 = 130 mg, Intravenous,  Once, 1 of 4 cycles Dose modification: 70 mg/m2 (original dose 75 mg/m2, Cycle 2, Reason: Provider Judgment) Administration: 130 mg (04/14/2019)  for chemotherapy treatment.      CHIEF COMPLIANT: Cycle 2 Taxotere and Cytoxan  INTERVAL HISTORY: Terri Hoover is a 44 y.o. with above-mentioned history of right breast cancer who underwent a right lumpectomy and is currently on adjuvant chemotherapy with Taxotere and Cytoxan. She presents to the clinic today for cycle 2.   After last chemotherapy she felt fatigue for a few days but then fully recovered.  Her biggest complaint today is of 1 pound that she gained since last week and that she is very concerned about weight gain from chemo.  ALLERGIES:  has No Known Allergies.  MEDICATIONS:  Current Outpatient Medications  Medication Sig Dispense Refill  . ALPRAZolam (XANAX) 0.25 MG tablet Take 1 tablet (0.25 mg total) by mouth at bedtime as needed for anxiety. 30 tablet 2  . dexamethasone (DECADRON) 4 MG tablet  Take 1 tablet (4 mg total) by mouth 2 (two) times daily. Take 1 tablet day before chemo and 1 tablet 1 days after chemo with food 8 tablet 0  . FLUoxetine (PROZAC) 40 MG capsule Take 1 capsule (40 mg total) by mouth daily. 90 capsule 3   . levothyroxine (SYNTHROID) 75 MCG tablet Take 75 mcg by mouth daily before breakfast.    . lidocaine-prilocaine (EMLA) cream Apply to affected area once 30 g 3  . liothyronine (CYTOMEL) 5 MCG tablet Take 5 mcg by mouth daily.    Marland Kitchen LORazepam (ATIVAN) 0.5 MG tablet Take 1 tablet (0.5 mg total) by mouth at bedtime as needed for sleep. 30 tablet 0  . Magnesium 500 MG TABS Take by mouth.    . magnesium oxide (MAG-OX) 400 (241.3 Mg) MG tablet Take 1 tablet (400 mg total) by mouth daily.    . Multiple Vitamin (MULTIVITAMIN) tablet Take 1 tablet by mouth daily.    . ondansetron (ZOFRAN) 8 MG tablet Take 1 tablet (8 mg total) by mouth 2 (two) times daily as needed for refractory nausea / vomiting. Start on day 3 after chemo. 30 tablet 1  . prochlorperazine (COMPAZINE) 10 MG tablet Take 1 tablet (10 mg total) by mouth every 6 (six) hours as needed (Nausea or vomiting). 30 tablet 1  . traMADol (ULTRAM) 50 MG tablet Take 1 tablet (50 mg total) by mouth every 6 (six) hours as needed. 10 tablet 0   No current facility-administered medications for this visit.    PHYSICAL EXAMINATION: ECOG PERFORMANCE STATUS: 1 - Symptomatic but completely ambulatory  Vitals:   05/05/19 1112  BP: 129/84  Pulse: 67  Resp: 18  Temp: 98.2 F (36.8 C)  SpO2: 100%   Filed Weights   05/05/19 1112  Weight: 151 lb 4.8 oz (68.6 kg)    LABORATORY DATA:  I have reviewed the data as listed CMP Latest Ref Rng & Units 04/21/2019 04/14/2019 10/14/2018  Glucose 70 - 99 mg/dL 112(H) 86 87  BUN 6 - 20 mg/dL _0 Creatinine 0.44 - 1.00 mg/dL 0.75 0.75 0.81  Sodium 135 - 145 mmol/L 141 141 137  Potassium 3.5 - 5.1 mmol/L 4.1 3.5 4.7  Chloride 98 - 111 mmol/L 106 107 104  CO2 22 - 32 mmol/L _1 Calcium 8.9 - 10.3 mg/dL 8.7(L) 8.5(L) 9.4  Total Protein 6.5 - 8.1 g/dL 7.0 6.7 6.6  Total Bilirubin 0.3 - 1.2 mg/dL 0.6 0.7 0.5  Alkaline Phos 38 - 126 U/L 77 62 -  AST 15 - 41 U/L 53(H) 18 17  ALT 0 - 44 U/L 85(H) 20 15     Lab Results  Component Value Date   WBC 11.1 (H) 05/05/2019   HGB 13.1 05/05/2019   HCT 40.7 05/05/2019   MCV 91.3 05/05/2019   PLT 237 05/05/2019   NEUTROABS 8.1 (H) 05/05/2019    ASSESSMENT & PLAN:  Malignant neoplasm of lower-outer quadrant of right breast of female, estrogen receptor positive ([place]) 01/15/2019:screening mammogram detected area of distortion in the lower central right breast with a possible associated mass. 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 She underwent a lumpectomy with Dr. 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 Adjuvant radiation therapy if she undergoes breast conserving surgeryfollowed by 3. Adjuvant antiestrogen therapy ---------------------------------------------------------------------------------------------------------------------------------------------------------------------- Current treatment: Cycle 2 Taxotere and Cytoxan Chemo toxicities: 1. Neutropenia: We reduced the dosage of chemotherapy.  She received the first cycle without growth factor injection. 2. Fatigue I discussed with her about not taking the dexamethasone the day after chemo.  This might help with her weight gain concerns. She is exercising 5 miles every day by walking.  Based on the excellent white blood cell count today and with the reduced dosage of her chemo, I do not recommend on pro / Udenyca injection.  Return to clinic in 3 weeks for cycle 3.    No orders of the  defined types were placed in this encounter.  The patient has a good understanding of the overall plan. she agrees with it. she will call with any problems that may develop before the next visit here.  Total time spent: 30 mins including face to face time and time spent for planning, charting and coordination of care  Nicholas Lose, MD 05/05/2019  I, Cloyde Reams Dorshimer, am acting as scribe for Dr. Nicholas Lose.  I have reviewed the above documentation for accuracy and completeness, and I agree with the above.

## 2019-05-05 ENCOUNTER — Other Ambulatory Visit: Payer: Self-pay | Admitting: Lab

## 2019-05-05 ENCOUNTER — Inpatient Hospital Stay: Payer: Managed Care, Other (non HMO)

## 2019-05-05 ENCOUNTER — Other Ambulatory Visit: Payer: Self-pay

## 2019-05-05 ENCOUNTER — Encounter: Payer: Self-pay | Admitting: *Deleted

## 2019-05-05 ENCOUNTER — Inpatient Hospital Stay (HOSPITAL_BASED_OUTPATIENT_CLINIC_OR_DEPARTMENT_OTHER): Payer: Managed Care, Other (non HMO) | Admitting: Hematology and Oncology

## 2019-05-05 ENCOUNTER — Inpatient Hospital Stay: Payer: Managed Care, Other (non HMO) | Attending: Hematology and Oncology

## 2019-05-05 DIAGNOSIS — Z95828 Presence of other vascular implants and grafts: Secondary | ICD-10-CM

## 2019-05-05 DIAGNOSIS — Z5111 Encounter for antineoplastic chemotherapy: Secondary | ICD-10-CM | POA: Insufficient documentation

## 2019-05-05 DIAGNOSIS — C50511 Malignant neoplasm of lower-outer quadrant of right female breast: Secondary | ICD-10-CM | POA: Diagnosis not present

## 2019-05-05 DIAGNOSIS — Z170 Estrogen receptor positive status [ER+]: Secondary | ICD-10-CM

## 2019-05-05 LAB — CBC WITH DIFFERENTIAL (CANCER CENTER ONLY)
Abs Immature Granulocytes: 0.08 10*3/uL — ABNORMAL HIGH (ref 0.00–0.07)
Basophils Absolute: 0.1 10*3/uL (ref 0.0–0.1)
Basophils Relative: 1 %
Eosinophils Absolute: 0 10*3/uL (ref 0.0–0.5)
Eosinophils Relative: 0 %
HCT: 40.7 % (ref 36.0–46.0)
Hemoglobin: 13.1 g/dL (ref 12.0–15.0)
Immature Granulocytes: 1 %
Lymphocytes Relative: 17 %
Lymphs Abs: 1.9 10*3/uL (ref 0.7–4.0)
MCH: 29.4 pg (ref 26.0–34.0)
MCHC: 32.2 g/dL (ref 30.0–36.0)
MCV: 91.3 fL (ref 80.0–100.0)
Monocytes Absolute: 0.9 10*3/uL (ref 0.1–1.0)
Monocytes Relative: 8 %
Neutro Abs: 8.1 10*3/uL — ABNORMAL HIGH (ref 1.7–7.7)
Neutrophils Relative %: 73 %
Platelet Count: 237 10*3/uL (ref 150–400)
RBC: 4.46 MIL/uL (ref 3.87–5.11)
RDW: 12.6 % (ref 11.5–15.5)
WBC Count: 11.1 10*3/uL — ABNORMAL HIGH (ref 4.0–10.5)
nRBC: 0 % (ref 0.0–0.2)

## 2019-05-05 LAB — CMP (CANCER CENTER ONLY)
ALT: 19 U/L (ref 0–44)
AST: 17 U/L (ref 15–41)
Albumin: 4 g/dL (ref 3.5–5.0)
Alkaline Phosphatase: 63 U/L (ref 38–126)
Anion gap: 7 (ref 5–15)
BUN: 15 mg/dL (ref 6–20)
CO2: 28 mmol/L (ref 22–32)
Calcium: 9.1 mg/dL (ref 8.9–10.3)
Chloride: 103 mmol/L (ref 98–111)
Creatinine: 0.79 mg/dL (ref 0.44–1.00)
GFR, Est AFR Am: >60 mL/min (ref >60)
GFR, Estimated: >60 mL/min (ref >60)
Glucose, Bld: 82 mg/dL (ref 70–99)
Potassium: 3.6 mmol/L (ref 3.5–5.1)
Sodium: 138 mmol/L (ref 135–145)
Total Bilirubin: 0.6 mg/dL (ref 0.3–1.2)
Total Protein: 6.8 g/dL (ref 6.5–8.1)

## 2019-05-05 LAB — TSH: TSH: 0.558 u[IU]/mL (ref 0.308–3.960)

## 2019-05-05 MED ORDER — SODIUM CHLORIDE 0.9% FLUSH
10.0000 mL | Freq: Once | INTRAVENOUS | Status: AC
  Administered 2019-05-05: 10 mL via INTRAVENOUS
  Filled 2019-05-05: qty 10

## 2019-05-05 MED ORDER — ALTEPLASE 2 MG IJ SOLR
INTRAMUSCULAR | Status: AC
  Filled 2019-05-05: qty 2

## 2019-05-05 MED ORDER — SODIUM CHLORIDE 0.9% FLUSH
10.0000 mL | INTRAVENOUS | Status: DC | PRN
  Administered 2019-05-05: 10 mL
  Filled 2019-05-05: qty 10

## 2019-05-05 MED ORDER — DEXAMETHASONE SODIUM PHOSPHATE 10 MG/ML IJ SOLN
10.0000 mg | Freq: Once | INTRAMUSCULAR | Status: AC
  Administered 2019-05-05: 10 mg via INTRAVENOUS

## 2019-05-05 MED ORDER — PALONOSETRON HCL INJECTION 0.25 MG/5ML
INTRAVENOUS | Status: AC
  Filled 2019-05-05: qty 5

## 2019-05-05 MED ORDER — SODIUM CHLORIDE 0.9 % IV SOLN
500.0000 mg/m2 | Freq: Once | INTRAVENOUS | Status: AC
  Administered 2019-05-05: 880 mg via INTRAVENOUS
  Filled 2019-05-05: qty 44

## 2019-05-05 MED ORDER — ALTEPLASE 2 MG IJ SOLR
2.0000 mg | Freq: Once | INTRAMUSCULAR | Status: AC
  Administered 2019-05-05: 2 mg
  Filled 2019-05-05: qty 2

## 2019-05-05 MED ORDER — SODIUM CHLORIDE 0.9 % IV SOLN
Freq: Once | INTRAVENOUS | Status: AC
  Filled 2019-05-05: qty 250

## 2019-05-05 MED ORDER — PALONOSETRON HCL INJECTION 0.25 MG/5ML
0.2500 mg | Freq: Once | INTRAVENOUS | Status: AC
  Administered 2019-05-05: 0.25 mg via INTRAVENOUS

## 2019-05-05 MED ORDER — HEPARIN SOD (PORK) LOCK FLUSH 100 UNIT/ML IV SOLN
500.0000 [IU] | Freq: Once | INTRAVENOUS | Status: AC | PRN
  Administered 2019-05-05: 500 [IU]
  Filled 2019-05-05: qty 5

## 2019-05-05 MED ORDER — SODIUM CHLORIDE 0.9 % IV SOLN
70.0000 mg/m2 | Freq: Once | INTRAVENOUS | Status: AC
  Administered 2019-05-05: 120 mg via INTRAVENOUS
  Filled 2019-05-05: qty 12

## 2019-05-05 MED ORDER — DEXAMETHASONE SODIUM PHOSPHATE 10 MG/ML IJ SOLN
INTRAMUSCULAR | Status: AC
  Filled 2019-05-05: qty 1

## 2019-05-05 NOTE — Patient Instructions (Signed)
[facility] Discharge Instructions for Patients Receiving Chemotherapy  Today you received the following chemotherapy agents: Taxotere, Cytoxan  To help prevent nausea and vomiting after your treatment, we encourage you to take your nausea medication as directed.   If you develop nausea and vomiting that is not controlled by your nausea medication, call the clinic.   BELOW ARE SYMPTOMS THAT SHOULD BE REPORTED IMMEDIATELY:  *FEVER GREATER THAN 100.5 F  *CHILLS WITH OR WITHOUT FEVER  NAUSEA AND VOMITING THAT IS NOT CONTROLLED WITH YOUR NAUSEA MEDICATION  *UNUSUAL SHORTNESS OF BREATH  *UNUSUAL BRUISING OR BLEEDING  TENDERNESS IN MOUTH AND THROAT WITH OR WITHOUT PRESENCE OF ULCERS  *URINARY PROBLEMS  *BOWEL PROBLEMS  UNUSUAL RASH Items with * indicate a potential emergency and should be followed up as soon as possible.  Feel free to call the clinic should you have any questions or concerns. The clinic phone number is [phone].  Please show the [place] at check-in to the Emergency Department and triage nurse.

## 2019-05-05 NOTE — Assessment & Plan Note (Signed)
01/15/2019:screening mammogram detected area of distortion in the lower central right breast with a possible associated mass. 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 She underwent a lumpectomy with Dr. 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 Adjuvant radiation therapy if she undergoes breast conserving surgeryfollowed by 3. Adjuvant antiestrogen therapy ---------------------------------------------------------------------------------------------------------------------------------------------------------------------- Current treatment: Cycle 2 Taxotere and Cytoxan Chemo toxicities: 1. Neutropenia: We reduced the dosage of chemotherapy.  She received the first cycle without growth factor injection. 2. Fatigue  Return to clinic in 3 weeks for cycle 3.

## 2019-05-06 ENCOUNTER — Other Ambulatory Visit: Payer: Self-pay | Admitting: *Deleted

## 2019-05-06 DIAGNOSIS — C50511 Malignant neoplasm of lower-outer quadrant of right female breast: Secondary | ICD-10-CM

## 2019-05-06 LAB — T3: T3, Total: 84 ng/dL (ref 71–180)

## 2019-05-06 LAB — T4: T4, Total: 6.4 ug/dL (ref 4.5–12.0)

## 2019-05-07 ENCOUNTER — Ambulatory Visit: Payer: Managed Care, Other (non HMO)

## 2019-05-25 NOTE — Progress Notes (Signed)
[facility] Follow up:    Patient, No Pcp Per No address on file   DIAGNOSIS: Cancer Staging Malignant neoplasm of lower-outer quadrant of right breast of female, estrogen receptor positive ([place]) Staging form: Breast, AJCC 8th Edition - Pathologic stage from 02/10/2019: Stage IA (pT1b, pN0, cM0, G1, ER+, PR+, HER2-, Oncotype DX score: 24) - Signed by Gardenia Phlegm, NP on 05/25/2019   SUMMARY OF ONCOLOGIC HISTORY: Oncology History  Malignant neoplasm of lower-outer quadrant of right breast of female, estrogen receptor positive ([place])  01/15/2019 Initial Diagnosis   Screening mammogram detected an area of distortion in the lower central right breast with a possible associated mass. 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 Lumpectomy showed IDC.    02/10/2019 Surgery   Right lumpectomy [name]): IDC, grade 1, 0.8cm, involved lateral margin, HER-2 equivocal (2+) by IHC, negative by FISH, ER+ 50%, PR+ 50%, Ki67 10%   02/10/2019 Cancer Staging   Staging form: Breast, AJCC 8th Edition - Pathologic stage from 02/10/2019: Stage IA (pT1b, pN0, cM0, G1, ER+, PR+, HER2-, Oncotype DX score: 24) - Signed by Gardenia Phlegm, NP on 05/25/2019   03/04/2019 Oncotype testing   Score of 24/10%; about a 6.5% benefit from chemotherapy   03/09/2019 Genetic Testing   Negative genetic testing:  No pathogenic variants detected on the [facility] Breast Cancer STAT panel or the Common Hereditary Cancers panel. The report date is 03/09/19.  The Breast Cancer STAT panel offered by [facility] includes sequencing and rearrangement analysis for the following 9 genes:  ATM, BRCA1, BRCA2, CDH1, CHEK2, PALB2, PTEN, STK11 and TP53.  The Common Hereditary Cancers Panel offered by [facility] includes sequencing and/or deletion duplication testing of the following 48 genes: APC, ATM, AXIN2, BARD1, BMPR1A, BRCA1, BRCA2, BRIP1, CDH1, CDK4, CDKN2A (p14ARF), CDKN2A (p16INK4a), CHEK2,  CTNNA1, DICER1, EPCAM (Deletion/duplication testing only), GREM1 (promoter region deletion/duplication testing only), KIT, MEN1, MLH1, MSH2, MSH3, MSH6, MUTYH, NBN, NF1, NHTL1, PALB2, PDGFRA, PMS2, POLD1, POLE, PTEN, RAD50, RAD51C, RAD51D, RNF43, SDHB, SDHC, SDHD, SMAD4, SMARCA4. STK11, TP53, TSC1, TSC2, and VHL.  The following genes were evaluated for sequence changes only: SDHA and HOXB13 c.251G>A variant only.    03/17/2019 Surgery   Right lumpectomy [name]): no residual carcinoma and 5 right axillary lymph nodes negative   04/14/2019 - 06/16/2019 Adjuvant Chemotherapy   Taxotere and Cytoxan x 4     CURRENT THERAPY: Adjuvant Taxotere and Cytoxan  INTERVAL HISTORY: Terri Hoover 44 y.o. female returns for evaluation prior to receiving her third cycle of Taxotere and Cytoxan adjuvantly.  She is doing well.  She notes her kids went back to in person school today.  They are 15 and 16.  She does not receive growth factor support following chemotherapy.  She denies any peripheral neuropathy.  She has had no nausea or vomiting.  She denies bowel issues or concerns.     Patient Active Problem List   Diagnosis Date Noted  . Genetic testing 03/10/2019  . Family history of thyroid cancer   . Family history of melanoma   . Family history of esophageal cancer   . Family history of lung cancer   . Family history of ovarian cancer   . Malignant neoplasm of lower-outer quadrant of right breast of female, estrogen receptor positive ([place]) 02/19/2019  . Low back pain 10/28/2018  . 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 Adjustment disorder with depressed mood 03/24/2014  . OAB (overactive bladder) 11/26/2013  . Family history of  breast cancer 10/08/2012  . Hemorrhoid 03/05/2012  . Endometriosis   . Condyloma acuminatum   . Hashimoto's disease   . Dysplasia of cervix, low grade (CIN 1) 06/26/2009    has No Known Allergies.  MEDICAL HISTORY: Past Medical History:  Diagnosis Date  . Anxiety    . ASCUS of cervix with negative high risk HPV 09/2018  . Family history of breast cancer   . Family history of esophageal cancer   . Family history of lung cancer   . Family history of melanoma   . Family history of ovarian cancer   . Family history of thyroid cancer   . Frequency of urination   . Hashimoto's disease   . History of TMJ disorder   . Hypothyroidism     SURGICAL HISTORY: Past Surgical History:  Procedure Laterality Date  . BREAST BIOPSY Right 2016   benign  . BREAST BIOPSY Right 2019  . BREAST LUMPECTOMY WITH SENTINEL LYMPH NODE BIOPSY Right 03/17/2019   Procedure: RE EXCISION OF RIGHT BREAST LUMPECTOMY WITH RIGHT AXILLARY SENTINEL LYMPH NODE BIOPSY AND BLUE DYE INJECTION;  Surgeon: Rolm Bookbinder, MD;  Location: [place] [facility];  Service: General;  Laterality: Right;  . COLPOSCOPY  2000  . CYSTO WITH HYDRODISTENSION N/A 05/06/2013   Procedure: CYSTOSCOPY/HYDRODISTENSION WITH TRIGGER POINT INJECTION ;  Surgeon: Alexis Frock, MD;  Location: [facility];  Service: Urology;  Laterality: N/A;  . DILATION AND CURETTAGE OF UTERUS  09-04-2002   W/  SUCTION FOR POSTPARTUM RETAINED PRODUCTS  . DX LAPAROSCOPY /  LASER ABLATION ENDOMETRIOSIS  2002  . PORTACATH PLACEMENT Right 03/17/2019   Procedure: INSERTION PORT-A-CATH WITH ULTRASOUND;  Surgeon: Rolm Bookbinder, MD;  Location: [place];  Service: General;  Laterality: Right;  . RADIOACTIVE SEED GUIDED EXCISIONAL BREAST BIOPSY Right 02/10/2019   Procedure: RADIOACTIVE SEED GUIDED EXCISIONAL RIGHT BREAST BIOPSY;  Surgeon: Rolm Bookbinder, MD;  Location: [place];  Service: General;  Laterality: Right;  . TEMPOROMANDIBULAR JOINT SURGERY  2000    SOCIAL HISTORY: Social History   Socioeconomic History  . Marital status: Married    Spouse name: Not on file  . Number of children: Not on file  . Years of education: Not on file  . Highest education level: Not  on file  Occupational History  . Not on file  Tobacco Use  . Smoking status: Never Smoker  . Smokeless tobacco: Never Used  Substance and Sexual Activity  . Alcohol use: Yes    Alcohol/week: 0.0 standard drinks    Comment: OCCASIONALLY  . Drug use: No  . Sexual activity: Yes    Birth control/protection: Pill  Other Topics Concern  . Not on file  Social History Narrative  . Not on file   Social Determinants of Health   Financial Resource Strain:   . Difficulty of Paying Living Expenses:   Food Insecurity:   . Worried About Charity fundraiser in the Last Year:   . Arboriculturist in the Last Year:   Transportation Needs:   . Film/video editor (Medical):   Marland Kitchen Lack of Transportation (Non-Medical):   Physical Activity:   . Days of Exercise per Week:   . Minutes of Exercise per Session:   Stress:   . Feeling of Stress :   Social Connections:   . Frequency of Communication with Friends and Family:   . Frequency of Social Gatherings with Friends and Family:   . Attends  Religious Services:   . Active Member of Clubs or Organizations:   . Attends Archivist Meetings:   Marland Kitchen Marital Status:   Intimate Partner Violence:   . Fear of Current or Ex-Partner:   . Emotionally Abused:   Marland Kitchen Physically Abused:   . Sexually Abused:     FAMILY HISTORY: Family History  Problem Relation Age of Onset  . Heart disease Father   . Breast cancer Maternal Aunt 35  . Breast cancer Maternal Grandmother 85       recurred at age 61  . Esophageal cancer Paternal Uncle 14  . Thyroid cancer Maternal Grandfather        dx. in his 21s  . Melanoma Paternal Grandmother        dx. in her 29s  . Lung cancer Paternal Grandfather        dx. in his 59s  . Bladder Cancer Paternal Grandfather 80  . Breast cancer Other 67       maternal grandmother's sister  . Breast cancer Other 50       paternal 4th degree relative  . Liver cancer Other 55  . Ovarian cancer Other 37       paternal 5th  degree relative    Review of Systems  Constitutional: Positive for fatigue. Negative for appetite change, chills, fever and unexpected weight change.  HENT:   Negative for hearing loss and lump/mass.   Eyes: Negative for eye problems and icterus.  Respiratory: Negative for chest tightness, cough and shortness of breath.   Cardiovascular: Negative for chest pain, leg swelling and palpitations.  Gastrointestinal: Negative for abdominal distention, abdominal pain, constipation, diarrhea, nausea and vomiting.  Endocrine: Negative for hot flashes.  Genitourinary: Negative for difficulty urinating.   Musculoskeletal: Negative for arthralgias.  Skin: Negative for itching and rash.  Neurological: Negative for dizziness, extremity weakness, headaches and numbness.  Hematological: Negative for adenopathy. Does not bruise/bleed easily.  Psychiatric/Behavioral: Negative for depression. The patient is not nervous/anxious.       PHYSICAL EXAMINATION  ECOG PERFORMANCE STATUS: 1 - Symptomatic but completely ambulatory  Vitals:   05/26/19 0958  BP: 123/75  Pulse: (!) 56  Resp: 20  Temp: 98.3 F (36.8 C)  SpO2: 100%    Physical Exam Constitutional:      General: She is not in acute distress.    Appearance: Normal appearance. She is not toxic-appearing.  HENT:     Head: Normocephalic.  Eyes:     General: No scleral icterus.    Pupils: Pupils are equal, round, and reactive to light.  Cardiovascular:     Rate and Rhythm: Normal rate and regular rhythm.     Pulses: Normal pulses.     Heart sounds: Normal heart sounds.  Pulmonary:     Effort: Pulmonary effort is normal.     Breath sounds: Normal breath sounds.  Abdominal:     General: Abdomen is flat. Bowel sounds are normal.     Palpations: Abdomen is soft.  Musculoskeletal:        General: No swelling.     Cervical back: Neck supple.  Lymphadenopathy:     Cervical: No cervical adenopathy.  Skin:    General: Skin is warm and dry.      Capillary Refill: Capillary refill takes less than 2 seconds.     Findings: No rash.  Neurological:     General: No focal deficit present.     Mental Status: She is alert.  Psychiatric:  Mood and Affect: Mood normal.        Behavior: Behavior normal.     LABORATORY DATA:  CBC    Component Value Date/Time   WBC 5.9 05/26/2019 0927   WBC 6.8 10/14/2018 1016   RBC 4.44 05/26/2019 0927   HGB 13.2 05/26/2019 0927   HCT 40.6 05/26/2019 0927   PLT 249 05/26/2019 0927   MCV 91.4 05/26/2019 0927   MCH 29.7 05/26/2019 0927   MCHC 32.5 05/26/2019 0927   RDW 13.3 05/26/2019 0927   LYMPHSABS 1.1 05/26/2019 0927   MONOABS 0.5 05/26/2019 0927   EOSABS 0.0 05/26/2019 0927   BASOSABS 0.1 05/26/2019 0927    CMP     Component Value Date/Time   NA 140 05/26/2019 0927   K 4.0 05/26/2019 0927   CL 107 05/26/2019 0927   CO2 26 05/26/2019 0927   GLUCOSE 78 05/26/2019 0927   BUN 16 05/26/2019 0927   CREATININE 0.79 05/26/2019 0927   CREATININE 0.81 10/14/2018 1016   CALCIUM 9.0 05/26/2019 0927   PROT 6.7 05/26/2019 0927   ALBUMIN 3.9 05/26/2019 0927   AST 22 05/26/2019 0927   ALT 23 05/26/2019 0927   ALKPHOS 59 05/26/2019 0927   BILITOT 0.6 05/26/2019 0927   GFRNONAA >60 05/26/2019 0927   GFRAA >60 05/26/2019 0927      ASSESSMENT and PLAN:   Malignant neoplasm of lower-outer quadrant of right breast of female, estrogen receptor positive ([place]) 01/15/2019:screening mammogram detected area of distortion in the lower central right breast with a possible associated mass. 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 She underwent a lumpectomy with Dr. 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    Genetics were negative Lumpectomy and sentinel node biopsy showed no residual invasive cancer and 5 SLN negative  Treatment plan: 1.Adjuvant chemotherapy with Taxotere and Cytoxan every 3 weeks x4 cyclesfrom 04/14/2019 through 06/16/2019 2. Adjuvant radiation  3. Adjuvant antiestrogen therapy ---------------------------------------------------------------------------------------------------------------------------------------------------------------------- Current treatment: Cycle 3 Taxotere and Cytoxan Chemo toxicities: 1. Neutropenia: Not receiving Neulasta or biosimilar, she has been dose reduced.  2. Fatigue-stable  We discussed Marcelle's adjuvant radiation.  I placed a referral today for evaluation.    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  We reviewed that radiation is 5 days a week.  She is considering taking those four weeks off at work due to the physical demands of the long shifts, fatigue from radiation, and for scheduling purposes.   Return to clinic in 3 weeks for cycle 4.    Orders Placed This Encounter  Procedures  . Ambulatory referral to Radiation Oncology    Referral Priority:   Routine    Referral Type:   Consultation    Referral Reason:   Specialty Services Required    Referred to Provider:   Kyung Rudd, MD    Requested Specialty:   Radiation Oncology    Number of Visits Requested:   1    All questions were answered. The patient knows to call the clinic with any problems, questions or concerns. We can certainly see the patient much sooner if necessary.  Total encounter time: 20 minutes*  [name], NP 05/26/19  10:46 AM Medical Oncology and Hematology [facility] [place], [place] [address] Tel. [phone]    Fax. [phone]  *Total Encounter Time as defined by the Centers for Medicare and Medicaid Services includes, in addition to the  face-to-face time of a patient visit (documented in the note above) non-face-to-face time: obtaining and reviewing outside history, ordering and reviewing medications, tests or procedures, care coordination (communications with other health care professionals or caregivers) and documentation in the medical record.

## 2019-05-25 NOTE — Assessment & Plan Note (Addendum)
01/15/2019:screening mammogram detected area of distortion in the lower central right breast with a possible associated mass. 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 She underwent a lumpectomy with Dr. Wakefield on 02/10/19 for which pathology showed invasive ductal carcinoma, grade 1, 0.8cm, involved lateral margin, HER-2 equivocal (2+) by IHC, negative by FISH, ER+ 50%, PR+ 50%, Ki67 10%  Oncotype DX recurrence score 24: Distant recurrence at 9 years: 10%; approx 6.5% benefit from chemotherapy.   Genetics were negative Lumpectomy and sentinel node biopsy showed no residual invasive cancer and 5 SLN negative  Treatment plan: 1.Adjuvant chemotherapy with Taxotere and Cytoxan every 3 weeks x4 cyclesfrom 04/14/2019 through 06/16/2019 2. Adjuvant radiation  3. Adjuvant antiestrogen therapy ---------------------------------------------------------------------------------------------------------------------------------------------------------------------- Current treatment: Cycle 3 Taxotere and Cytoxan Chemo toxicities: 1. Neutropenia: Not receiving Neulasta or biosimilar, she has been dose reduced.  2. Fatigue-stable  We discussed Terri Hoover's adjuvant radiation.  I placed a referral today for evaluation.    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  We reviewed that radiation is 5 days a week.  She is considering taking those four weeks off at work due to the physical demands of the long shifts, fatigue from radiation, and for scheduling purposes.   Return to clinic in 3 weeks for cycle 4.  

## 2019-05-26 ENCOUNTER — Inpatient Hospital Stay: Payer: Managed Care, Other (non HMO)

## 2019-05-26 ENCOUNTER — Inpatient Hospital Stay (HOSPITAL_BASED_OUTPATIENT_CLINIC_OR_DEPARTMENT_OTHER): Payer: Managed Care, Other (non HMO) | Admitting: Adult Health

## 2019-05-26 ENCOUNTER — Encounter: Payer: Self-pay | Admitting: Adult Health

## 2019-05-26 ENCOUNTER — Other Ambulatory Visit: Payer: Self-pay

## 2019-05-26 VITALS — BP 123/75 | HR 56 | Temp 98.3°F | Resp 20 | Ht 65.0 in

## 2019-05-26 DIAGNOSIS — C50511 Malignant neoplasm of lower-outer quadrant of right female breast: Secondary | ICD-10-CM | POA: Diagnosis not present

## 2019-05-26 DIAGNOSIS — Z5111 Encounter for antineoplastic chemotherapy: Secondary | ICD-10-CM | POA: Diagnosis not present

## 2019-05-26 DIAGNOSIS — Z95828 Presence of other vascular implants and grafts: Secondary | ICD-10-CM

## 2019-05-26 DIAGNOSIS — Z170 Estrogen receptor positive status [ER+]: Secondary | ICD-10-CM

## 2019-05-26 LAB — CBC WITH DIFFERENTIAL (CANCER CENTER ONLY)
Abs Immature Granulocytes: 0.05 10*3/uL (ref 0.00–0.07)
Basophils Absolute: 0.1 10*3/uL (ref 0.0–0.1)
Basophils Relative: 1 %
Eosinophils Absolute: 0 10*3/uL (ref 0.0–0.5)
Eosinophils Relative: 1 %
HCT: 40.6 % (ref 36.0–46.0)
Hemoglobin: 13.2 g/dL (ref 12.0–15.0)
Immature Granulocytes: 1 %
Lymphocytes Relative: 19 %
Lymphs Abs: 1.1 10*3/uL (ref 0.7–4.0)
MCH: 29.7 pg (ref 26.0–34.0)
MCHC: 32.5 g/dL (ref 30.0–36.0)
MCV: 91.4 fL (ref 80.0–100.0)
Monocytes Absolute: 0.5 10*3/uL (ref 0.1–1.0)
Monocytes Relative: 8 %
Neutro Abs: 4.1 10*3/uL (ref 1.7–7.7)
Neutrophils Relative %: 70 %
Platelet Count: 249 10*3/uL (ref 150–400)
RBC: 4.44 MIL/uL (ref 3.87–5.11)
RDW: 13.3 % (ref 11.5–15.5)
WBC Count: 5.9 10*3/uL (ref 4.0–10.5)
nRBC: 0 % (ref 0.0–0.2)

## 2019-05-26 LAB — CMP (CANCER CENTER ONLY)
ALT: 23 U/L (ref 0–44)
AST: 22 U/L (ref 15–41)
Albumin: 3.9 g/dL (ref 3.5–5.0)
Alkaline Phosphatase: 59 U/L (ref 38–126)
Anion gap: 7 (ref 5–15)
BUN: 16 mg/dL (ref 6–20)
CO2: 26 mmol/L (ref 22–32)
Calcium: 9 mg/dL (ref 8.9–10.3)
Chloride: 107 mmol/L (ref 98–111)
Creatinine: 0.79 mg/dL (ref 0.44–1.00)
GFR, Est AFR Am: >60 mL/min (ref >60)
GFR, Estimated: >60 mL/min (ref >60)
Glucose, Bld: 78 mg/dL (ref 70–99)
Potassium: 4 mmol/L (ref 3.5–5.1)
Sodium: 140 mmol/L (ref 135–145)
Total Bilirubin: 0.6 mg/dL (ref 0.3–1.2)
Total Protein: 6.7 g/dL (ref 6.5–8.1)

## 2019-05-26 LAB — T4, FREE: Free T4: 0.79 ng/dL (ref 0.61–1.12)

## 2019-05-26 MED ORDER — DEXAMETHASONE SODIUM PHOSPHATE 10 MG/ML IJ SOLN
10.0000 mg | Freq: Once | INTRAMUSCULAR | Status: AC
  Administered 2019-05-26: 11:00:00 10 mg via INTRAVENOUS

## 2019-05-26 MED ORDER — DEXAMETHASONE SODIUM PHOSPHATE 10 MG/ML IJ SOLN
INTRAMUSCULAR | Status: AC
  Filled 2019-05-26: qty 1

## 2019-05-26 MED ORDER — PALONOSETRON HCL INJECTION 0.25 MG/5ML
0.2500 mg | Freq: Once | INTRAVENOUS | Status: AC
  Administered 2019-05-26: 11:00:00 0.25 mg via INTRAVENOUS

## 2019-05-26 MED ORDER — HEPARIN SOD (PORK) LOCK FLUSH 100 UNIT/ML IV SOLN
500.0000 [IU] | Freq: Once | INTRAVENOUS | Status: AC | PRN
  Administered 2019-05-26: 500 [IU]
  Filled 2019-05-26: qty 5

## 2019-05-26 MED ORDER — PALONOSETRON HCL INJECTION 0.25 MG/5ML
INTRAVENOUS | Status: AC
  Filled 2019-05-26: qty 5

## 2019-05-26 MED ORDER — SODIUM CHLORIDE 0.9 % IV SOLN
Freq: Once | INTRAVENOUS | Status: AC
  Filled 2019-05-26: qty 250

## 2019-05-26 MED ORDER — SODIUM CHLORIDE 0.9% FLUSH
10.0000 mL | INTRAVENOUS | Status: DC | PRN
  Administered 2019-05-26: 13:00:00 10 mL
  Filled 2019-05-26: qty 10

## 2019-05-26 MED ORDER — SODIUM CHLORIDE 0.9% FLUSH
10.0000 mL | INTRAVENOUS | Status: DC | PRN
  Administered 2019-05-26: 10 mL via INTRAVENOUS
  Filled 2019-05-26: qty 10

## 2019-05-26 MED ORDER — SODIUM CHLORIDE 0.9 % IV SOLN
500.0000 mg/m2 | Freq: Once | INTRAVENOUS | Status: AC
  Administered 2019-05-26: 12:00:00 880 mg via INTRAVENOUS
  Filled 2019-05-26: qty 44

## 2019-05-26 MED ORDER — SODIUM CHLORIDE 0.9 % IV SOLN
70.0000 mg/m2 | Freq: Once | INTRAVENOUS | Status: AC
  Administered 2019-05-26: 11:00:00 120 mg via INTRAVENOUS
  Filled 2019-05-26: qty 12

## 2019-05-26 NOTE — Patient Instructions (Signed)
[facility] Discharge Instructions for Patients Receiving Chemotherapy  Today you received the following chemotherapy agents: docetaxel and cyclophosphamide.  To help prevent nausea and vomiting after your treatment, we encourage you to take your nausea medication as directed.   If you develop nausea and vomiting that is not controlled by your nausea medication, call the clinic.   BELOW ARE SYMPTOMS THAT SHOULD BE REPORTED IMMEDIATELY:  *FEVER GREATER THAN 100.5 F  *CHILLS WITH OR WITHOUT FEVER  NAUSEA AND VOMITING THAT IS NOT CONTROLLED WITH YOUR NAUSEA MEDICATION  *UNUSUAL SHORTNESS OF BREATH  *UNUSUAL BRUISING OR BLEEDING  TENDERNESS IN MOUTH AND THROAT WITH OR WITHOUT PRESENCE OF ULCERS  *URINARY PROBLEMS  *BOWEL PROBLEMS  UNUSUAL RASH Items with * indicate a potential emergency and should be followed up as soon as possible.  Feel free to call the clinic should you have any questions or concerns. The clinic phone number is [phone].  Please show the CHEMO ALERT CARD at check-in to the Emergency Department and triage nurse.   

## 2019-05-26 NOTE — Progress Notes (Signed)
Note: Per Vickii Penna, RN - pt completed her period the day before yesterday.  Kennith Center, Pharm.D., CPP 05/26/2019@10 :37 AM

## 2019-05-27 ENCOUNTER — Telehealth: Payer: Self-pay | Admitting: Adult Health

## 2019-05-27 LAB — T3, FREE: T3, Free: 2.9 pg/mL (ref 2.0–4.4)

## 2019-05-27 NOTE — Telephone Encounter (Signed)
No 3/30 los. No changes made to pt's schedule.

## 2019-05-28 ENCOUNTER — Ambulatory Visit: Payer: Managed Care, Other (non HMO)

## 2019-06-03 NOTE — Progress Notes (Signed)
Location of Breast Cancer: Malignant neoplasm of lower outer Quadrant of right breast, + + -   Staging form: Breast, AJCC 8th Edition - Pathologic stage from 02/10/2019: Stage IA (pT1b, pN0, cM0, G1, ER+, PR+, HER2-, Oncotype DX score: 24) - Signed by Gardenia Phlegm, NP on 05/25/2019  Did patient present with symptoms (if so, please note symptoms) or was this found on screening mammography?: Screening mammogram  Mammogram: Distortion in the lower central right breast with a possible associated mass.  Histology per Pathology Report: Right Breast lateral margin- re-excision 03/17/2019   Right Breast Lumpectomy 02/10/2019   Right Breast Biopsy 01/15/2019  Receptor Status: ER(50% +), PR (50% +), Her2-neu (), Ki67-(10%)    Past/Anticipated interventions by surgeon, if any: Dr. [name] - 02/10/2019 Right lumpectomy IDC, grade 1, 0.8 cm, involved lateral margin, HER - 2 equivocal (2+) by IHC, negative by FISH, ER + 50%, PR + 50%, Ki67 10% -No residual carcinoma and 5 right axillary lymph nodes negative. Re-excision 03/17/2019  Past/Anticipated interventions by medical oncology, if any: Chemotherapy  Dr. Lindi Adie NP Delice Bison 05/26/2019 -Adjuvant chemotherapy: taxotere and cytoxan every 3 weeks x 4 cycles,  2/16-4/20/2021 -Adjuvant radiation -Adjuvant antiestrogen therapy. -We discussed adjuvant radiation.  I placed a referral today for evaluation.   Lymphedema issues, if any: No  Pain issues, if any:  No  SAFETY ISSUES:  Prior radiation? No  Pacemaker/ICD? No  Possible current pregnancy? Having periods, stopped birth control in December 2020  Is the patient on methotrexate?   Current Complaints / other details:   -Genetic Testing: Negative testing. - Oncotype Testing: Score of 24/10%; about a 6.5% benefit from chemotherapy     Cori Razor, RN 06/03/2019,8:03 AM

## 2019-06-04 ENCOUNTER — Other Ambulatory Visit: Payer: Self-pay

## 2019-06-04 ENCOUNTER — Telehealth: Payer: Self-pay | Admitting: *Deleted

## 2019-06-04 ENCOUNTER — Ambulatory Visit
Admission: RE | Admit: 2019-06-04 | Discharge: 2019-06-04 | Disposition: A | Discharge status: Home / Self Care | Payer: Managed Care, Other (non HMO) | Source: Ambulatory Visit | Attending: Radiation Oncology | Admitting: Radiation Oncology

## 2019-06-04 ENCOUNTER — Encounter: Payer: Self-pay | Admitting: Radiation Oncology

## 2019-06-04 VITALS — Ht 65.0 in | Wt 150.0 lb

## 2019-06-04 DIAGNOSIS — Z170 Estrogen receptor positive status [ER+]: Secondary | ICD-10-CM

## 2019-06-04 DIAGNOSIS — C50511 Malignant neoplasm of lower-outer quadrant of right female breast: Secondary | ICD-10-CM

## 2019-06-04 NOTE — Telephone Encounter (Signed)
Called patient to ask about coming in for a lab on 06-29-19 @ 10 am, spoke with patient and she agreed to do this

## 2019-06-04 NOTE — Progress Notes (Signed)
Radiation Oncology         [phone] ________________________________  Initial Outpatient Consultation - Conducted via telephone due to current COVID-19 concerns for limiting patient exposure  I spoke with the patient to conduct this consult visit via telephone to spare the patient unnecessary potential exposure in the healthcare setting during the current COVID-19 pandemic. The patient was notified in advance and was offered a [place] meeting to allow for face to face communication but unfortunately reported that they did not have the appropriate resources/technology to support such a visit and instead preferred to proceed with a telephone consult.   Name: Terri Hoover        MRN: [id number]  Date of Service: 06/04/2019 DOB: 06-Nov-1975  BH:ALPFXTK, No Pcp Per  Terri Hoover*     REFERRING PHYSICIAN: [name] Hoover*   DIAGNOSIS: The encounter diagnosis was Malignant neoplasm of lower-outer quadrant of right breast of female, estrogen receptor positive ([place]).   HISTORY OF PRESENT ILLNESS: Terri Hoover is a 44 y.o. female with a new diagnosis of right breast cancer. The patient was noted to have a strong family history of breast cancer and was being closely followed, she proceeded with screening mammography in November 2020, this detected a finding in her right breast that was described as a possible distortion. She underwent further diagnostic imaging of the right breast which revealed a suspicious distortion in the lower central breast, and ultrasound revealed normal-appearing breast tissue without an identified distortion, no lymphadenopathy was seen in the axilla. She underwent a stereo guided biopsy on 1119 which revealed focal pseudoangiomatous stromal hyperplasia ([place]). She met with Dr. Donne Hoover and was counseled on the rationale for surgical resection, and on 02/10/2019 underwent lumpectomy final pathology revealed a grade 1 invasive ductal carcinoma  measuring 8 mm positive at the lateral inked margin, and associated fibrocystic change, PASH, and usual ductal hyperplasia. She did pursue genetic testing which was negative for germline mutation, and subsequently underwent reexcision of her margins and sentinel lymph node biopsy on 03/17/2019. Her reexcision was clear of any residual carcinoma, and 5 sampled nodes were all negative for malignancy. Of note her tumor was ER/PR positive, and HER-2 negative. Her Oncotype score however was 24 so she began systemic chemotherapy on 04/04/2019. She is scheduled to complete her fourth cycle of this on 06/16/2019. She is contacted today by phone to discuss treatment recommendations with adjuvant radiotherapy.     PREVIOUS RADIATION THERAPY: No   PAST MEDICAL HISTORY:  Past Medical History:  Diagnosis Date  . Anxiety   . ASCUS of cervix with negative high risk HPV 09/2018  . Family history of breast cancer   . Family history of esophageal cancer   . Family history of lung cancer   . Family history of melanoma   . Family history of ovarian cancer   . Family history of thyroid cancer   . Frequency of urination   . Hashimoto's disease   . History of TMJ disorder   . Hypothyroidism        PAST SURGICAL HISTORY: Past Surgical History:  Procedure Laterality Date  . BREAST BIOPSY Right 2016   benign  . BREAST BIOPSY Right 2019  . BREAST LUMPECTOMY WITH SENTINEL LYMPH NODE BIOPSY Right 03/17/2019   Procedure: RE EXCISION OF RIGHT BREAST LUMPECTOMY WITH RIGHT AXILLARY SENTINEL LYMPH NODE BIOPSY AND BLUE DYE INJECTION;  Surgeon: Rolm Bookbinder, MD;  Location: [place];  Service: General;  Laterality: Right;  .  COLPOSCOPY  2000  . CYSTO WITH HYDRODISTENSION N/A 05/06/2013   Procedure: CYSTOSCOPY/HYDRODISTENSION WITH TRIGGER POINT INJECTION ;  Surgeon: Alexis Frock, MD;  Location: [facility];  Service: Urology;  Laterality: N/A;  . DILATION AND CURETTAGE OF UTERUS   09-04-2002   W/  SUCTION FOR POSTPARTUM RETAINED PRODUCTS  . DX LAPAROSCOPY /  LASER ABLATION ENDOMETRIOSIS  2002  . PORTACATH PLACEMENT Right 03/17/2019   Procedure: INSERTION PORT-A-CATH WITH ULTRASOUND;  Surgeon: Rolm Bookbinder, MD;  Location: [place];  Service: General;  Laterality: Right;  . RADIOACTIVE SEED GUIDED EXCISIONAL BREAST BIOPSY Right 02/10/2019   Procedure: RADIOACTIVE SEED GUIDED EXCISIONAL RIGHT BREAST BIOPSY;  Surgeon: Rolm Bookbinder, MD;  Location: [place];  Service: General;  Laterality: Right;  . TEMPOROMANDIBULAR JOINT SURGERY  2000     FAMILY HISTORY:  Family History  Problem Relation Age of Onset  . Heart disease Father   . Breast cancer Maternal Aunt 35  . Breast cancer Maternal Grandmother 85       recurred at age 80  . Esophageal cancer Paternal Uncle 14  . Thyroid cancer Maternal Grandfather        dx. in his 64s  . Melanoma Paternal Grandmother        dx. in her 30s  . Lung cancer Paternal Grandfather        dx. in his 43s  . Bladder Cancer Paternal Grandfather 67  . Breast cancer Other 47       maternal grandmother's sister  . Breast cancer Other 50       paternal 4th degree relative  . Liver cancer Other 55  . Ovarian cancer Other 37       paternal 5th degree relative     SOCIAL HISTORY:  reports that she has never smoked. She has never used smokeless tobacco. She reports current alcohol use. She reports that she does not use drugs. 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 She has two teenage daughters.   ALLERGIES: Patient has no known allergies.   MEDICATIONS:  Current Outpatient Medications  Medication Sig Dispense Refill  . ALPRAZolam (XANAX) 0.25 MG tablet Take 1 tablet (0.25 mg total) by mouth at bedtime as needed for anxiety. 30 tablet 2  . dexamethasone (DECADRON) 4 MG tablet Take 1 tablet (4 mg total) by mouth 2 (two) times daily.  Take 1 tablet day before chemo and 1 tablet 1 days after chemo with food 8 tablet 0  . FLUoxetine (PROZAC) 40 MG capsule Take 1 capsule (40 mg total) by mouth daily. 90 capsule 3  . levothyroxine (SYNTHROID) 75 MCG tablet Take 75 mcg by mouth daily before breakfast.    . lidocaine-prilocaine (EMLA) cream Apply to affected area once 30 g 3  . liothyronine (CYTOMEL) 5 MCG tablet Take 5 mcg by mouth daily.    Marland Kitchen LORazepam (ATIVAN) 0.5 MG tablet Take 1 tablet (0.5 mg total) by mouth at bedtime as needed for sleep. 30 tablet 0  . Magnesium 500 MG TABS Take by mouth.    . magnesium oxide (MAG-OX) 400 (241.3 Mg) MG tablet Take 1 tablet (400 mg total) by mouth daily.    . Multiple Vitamin (MULTIVITAMIN) tablet Take 1 tablet by mouth daily.    . ondansetron (ZOFRAN) 8 MG tablet Take 1 tablet (8 mg total) by mouth 2 (two) times daily as needed for refractory nausea / vomiting. Start on  day 3 after chemo. 30 tablet 1  . prochlorperazine (COMPAZINE) 10 MG tablet Take 1 tablet (10 mg total) by mouth every 6 (six) hours as needed (Nausea or vomiting). 30 tablet 1  . traMADol (ULTRAM) 50 MG tablet Take 1 tablet (50 mg total) by mouth every 6 (six) hours as needed. 10 tablet 0   No current facility-administered medications for this encounter.     REVIEW OF SYSTEMS: On review of systems, the patient reports that she is doing well overall with chemotherapy and notes fatigue at times but has otherwise tolerated it pretty well. She denies any chest pain, shortness of breath, cough, fevers, chills, night sweats, unintended weight changes. She denies any bowel or bladder disturbances, and denies abdominal pain, nausea or vomiting. She denies any new musculoskeletal or joint aches or pains. A complete review of systems is obtained and is otherwise negative.     PHYSICAL EXAM:  Unable to assess due to encounter type.   ECOG = 0  0 - Asymptomatic (Fully active, able to carry on all predisease activities without  restriction)  1 - Symptomatic but completely ambulatory (Restricted in physically strenuous activity but ambulatory and able to carry out work of a light or sedentary nature. For example, light housework, office work)  2 - Symptomatic, <50% in bed during the day (Ambulatory and capable of all self care but unable to carry out any work activities. Up and about more than 50% of waking hours)  3 - Symptomatic, >50% in bed, but not bedbound (Capable of only limited self-care, confined to bed or chair 50% or more of waking hours)  4 - Bedbound (Completely disabled. Cannot carry on any self-care. Totally confined to bed or chair)  5 - Death   Eustace Pen MM, Creech RH, Tormey DC, et al. [phone]). "Toxicity and response criteria of the [facility]". [place] Oncol. 5 (6): 649-55    LABORATORY DATA:  Lab Results  Component Value Date   WBC 5.9 05/26/2019   HGB 13.2 05/26/2019   HCT 40.6 05/26/2019   MCV 91.4 05/26/2019   PLT 249 05/26/2019   Lab Results  Component Value Date   NA 140 05/26/2019   K 4.0 05/26/2019   CL 107 05/26/2019   CO2 26 05/26/2019   Lab Results  Component Value Date   ALT 23 05/26/2019   AST 22 05/26/2019   ALKPHOS 59 05/26/2019   BILITOT 0.6 05/26/2019      RADIOGRAPHY: No results found.     IMPRESSION/PLAN: 1. Stage IA, pT1bN0M0 grade 1, ER/PR positive invasive ductal carcinoma of the right breast. Dr. Lisbeth Renshaw discusses the pathology findings and reviews the nature of early stage right breast disease. She has undergone lumpectomy and is healing well at this time. Her Oncotype score does not indicate a role for systemic therapy. We spent time today discussing the role of adjuvant whole breast radiotherapy. We discussed that external radiotherapy to the breast followed by antiestrogen therapy would be recommended to reduce risks of local and systemic recurrence. We discussed the risks, benefits, short, and long term effects of radiotherapy,  and the patient is interested in proceeding. Dr. Lisbeth Renshaw discusses the delivery and logistics of radiotherapy and anticipates a course of 6 1/2 weeks of radiotherapy. She will come for simulation the week of 07/06/19 2. Contraceptive Counseling.  The patient had a cycle since her last course of chemo. We discussed a urine Hcg test the week prior to simulation and to use  condoms when being sexually active. She is in agreement with this plan.     Given current concerns for patient exposure during the COVID-19 pandemic, this encounter was conducted via telephone.  The patient has provided two factor identification and has given verbal consent for this type of encounter and has been advised to only accept a meeting of this type in a secure network environment. The time spent during this encounter was 45 minutes including preparation, discussion, and coordination of the patient's care. The attendants for this meeting include Blenda Nicely, RN, Dr. Lisbeth Renshaw, Hayden Pedro  and [place].  During the encounter,  Blenda Nicely, RN, Dr. Lisbeth Renshaw, and Hayden Pedro were located at [facility] Radiation Oncology Department.  Man Bonneau [place] was located at home.    The above documentation reflects my direct findings during this shared patient visit. Please see the separate note by Dr. Lisbeth Renshaw on this date for the remainder of the patient's plan of care.    Carola Rhine, PAC

## 2019-06-10 NOTE — Progress Notes (Signed)
Pharmacist Chemotherapy Monitoring - Follow Up Assessment    I verify that I have reviewed each item in the below checklist:  . Regimen for the patient is scheduled for the appropriate day and plan matches scheduled date. Marland Kitchen Appropriate non-routine labs are ordered dependent on drug ordered. . If applicable, additional medications reviewed and ordered per protocol based on lifetime cumulative doses and/or treatment regimen.   Plan for follow-up and/or issues identified: No . I-vent associated with next due treatment: No . MD and/or nursing notified: No  Dajsha Massaro D 06/10/2019 1:47 PM n

## 2019-06-11 ENCOUNTER — Encounter: Payer: Self-pay | Admitting: *Deleted

## 2019-06-15 NOTE — Progress Notes (Signed)
Patient Care Team: Patient, No Pcp Per as PCP - General (General Practice) Mauro Kaufmann, RN as Oncology Nurse Navigator Rockwell Germany, RN as Oncology Nurse Navigator Nicholas Lose, MD as Consulting Physician (Hematology and Oncology) Rolm Bookbinder, MD as Consulting Physician (General Surgery)  DIAGNOSIS:    ICD-10-CM   1. Malignant neoplasm of lower-outer quadrant of right breast of female, estrogen receptor positive ([place])  C50.511    Z17.0     SUMMARY OF ONCOLOGIC HISTORY: Oncology History  Malignant neoplasm of lower-outer quadrant of right breast of female, estrogen receptor positive ([place])  01/15/2019 Initial Diagnosis   Screening mammogram detected an area of distortion in the lower central right breast with a possible associated mass. 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 Lumpectomy showed IDC.    02/10/2019 Surgery   Right lumpectomy [name]): IDC, grade 1, 0.8cm, involved lateral margin, HER-2 equivocal (2+) by IHC, negative by FISH, ER+ 50%, PR+ 50%, Ki67 10%   02/10/2019 Cancer Staging   Staging form: Breast, AJCC 8th Edition - Pathologic stage from 02/10/2019: Stage IA (pT1b, pN0, cM0, G1, ER+, PR+, HER2-, Oncotype DX score: 24) - Signed by Gardenia Phlegm, NP on 05/25/2019   03/04/2019 Oncotype testing   Score of 24/10%; about a 6.5% benefit from chemotherapy   03/09/2019 Genetic Testing   Negative genetic testing:  No pathogenic variants detected on the [facility] Breast Cancer STAT panel or the Common Hereditary Cancers panel. The report date is 03/09/19.  The Breast Cancer STAT panel offered by [facility] includes sequencing and rearrangement analysis for the following 9 genes:  ATM, BRCA1, BRCA2, CDH1, CHEK2, PALB2, PTEN, STK11 and TP53.  The Common Hereditary Cancers Panel offered by [facility] includes sequencing and/or deletion duplication testing of the following 48 genes: APC, ATM, AXIN2, BARD1, BMPR1A, BRCA1, BRCA2, BRIP1, CDH1,  CDK4, CDKN2A (p14ARF), CDKN2A (p16INK4a), CHEK2, CTNNA1, DICER1, EPCAM (Deletion/duplication testing only), GREM1 (promoter region deletion/duplication testing only), KIT, MEN1, MLH1, MSH2, MSH3, MSH6, MUTYH, NBN, NF1, NHTL1, PALB2, PDGFRA, PMS2, POLD1, POLE, PTEN, RAD50, RAD51C, RAD51D, RNF43, SDHB, SDHC, SDHD, SMAD4, SMARCA4. STK11, TP53, TSC1, TSC2, and VHL.  The following genes were evaluated for sequence changes only: SDHA and HOXB13 c.251G>A variant only.    03/17/2019 Surgery   Right lumpectomy [name]): no residual carcinoma and 5 right axillary lymph nodes negative   04/14/2019 - 06/16/2019 Adjuvant Chemotherapy   Taxotere and Cytoxan x 4     CHIEF COMPLIANT: Cycle 4 Taxotere and Cytoxan  INTERVAL HISTORY: Terri Hoover is a 44 y.o. with above-mentioned history of right breast cancer who underwent a rightlumpectomy and is currently on adjuvant chemotherapy with Taxotere and Cytoxan.She presents to the clinic todayfor cycle 4.    ALLERGIES:  has No Known Allergies.  MEDICATIONS:  Current Outpatient Medications  Medication Sig Dispense Refill  . ALPRAZolam (XANAX) 0.25 MG tablet Take 1 tablet (0.25 mg total) by mouth at bedtime as needed for anxiety. 30 tablet 2  . dexamethasone (DECADRON) 4 MG tablet Take 1 tablet (4 mg total) by mouth 2 (two) times daily. Take 1 tablet day before chemo and 1 tablet 1 days after chemo with food 8 tablet 0  . FLUoxetine (PROZAC) 40 MG capsule Take 1 capsule (40 mg total) by mouth daily. 90 capsule 3  . levothyroxine (SYNTHROID) 75 MCG tablet Take 75 mcg by mouth daily before breakfast.    . lidocaine-prilocaine (EMLA) cream Apply to affected area once 30 g 3  . liothyronine (CYTOMEL) 5  MCG tablet Take 5 mcg by mouth daily.    Marland Kitchen LORazepam (ATIVAN) 0.5 MG tablet Take 1 tablet (0.5 mg total) by mouth at bedtime as needed for sleep. 30 tablet 0  . Magnesium 500 MG TABS Take by mouth.    . Multiple Vitamin (MULTIVITAMIN) tablet Take 1 tablet by  mouth daily.    Marland Kitchen trimethoprim-polymyxin b (POLYTRIM) ophthalmic solution 1 drop every 3 (three) hours.    . ondansetron (ZOFRAN) 8 MG tablet Take 1 tablet (8 mg total) by mouth 2 (two) times daily as needed for refractory nausea / vomiting. Start on day 3 after chemo. (Patient not taking: Reported on 06/16/2019) 30 tablet 1  . prochlorperazine (COMPAZINE) 10 MG tablet Take 1 tablet (10 mg total) by mouth every 6 (six) hours as needed (Nausea or vomiting). (Patient not taking: Reported on 06/16/2019) 30 tablet 1   No current facility-administered medications for this visit.    PHYSICAL EXAMINATION: ECOG PERFORMANCE STATUS: 1 - Symptomatic but completely ambulatory  Vitals:   06/16/19 1146  BP: (!) 132/58  Pulse: 71  Resp: 18  Temp: 97.8 F (36.6 C)  SpO2: 100%   Filed Weights   06/16/19 1146  Weight: 152 lb 14.4 oz (69.4 kg)    LABORATORY DATA:  I have reviewed the data as listed CMP Latest Ref Rng & Units 05/26/2019 05/05/2019 04/21/2019  Glucose 70 - 99 mg/dL 78 82 112(H)  BUN 6 - 20 mg/dL 16 15 11   Creatinine 0.44 - 1.00 mg/dL 0.79 0.79 0.75  Sodium 135 - 145 mmol/L 140 138 141  Potassium 3.5 - 5.1 mmol/L 4.0 3.6 4.1  Chloride 98 - 111 mmol/L 107 103 106  CO2 22 - 32 mmol/L 26 28 25   Calcium 8.9 - 10.3 mg/dL 9.0 9.1 8.7(L)  Total Protein 6.5 - 8.1 g/dL 6.7 6.8 7.0  Total Bilirubin 0.3 - 1.2 mg/dL 0.6 0.6 0.6  Alkaline Phos 38 - 126 U/L 59 63 77  AST 15 - 41 U/L 22 17 53(H)  ALT 0 - 44 U/L 23 19 85(H)    Lab Results  Component Value Date   WBC 7.6 06/16/2019   HGB 13.4 06/16/2019   HCT 41.7 06/16/2019   MCV 90.7 06/16/2019   PLT 152 06/16/2019   NEUTROABS 6.3 06/16/2019    ASSESSMENT & PLAN:  Malignant neoplasm of lower-outer quadrant of right breast of female, estrogen receptor positive ([place]) 01/15/2019:screening mammogram detected area of distortion in the lower central right breast with a possible associated mass. Biopsy on 01/15/19 showed focal PASH and no  evidence of malignancy. She underwent a lumpectomy with Dr. 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   Genetics were negative Lumpectomy and sentinel node biopsy showed no residual invasive cancer and 5 SLN negative  Treatment plan: 1.Adjuvant chemotherapy with Taxotere and Cytoxan every 3 weeks x4 cyclesfrom 04/14/2019 through 06/16/2019 2. Adjuvant radiation  3. Adjuvant antiestrogen therapy ---------------------------------------------------------------------------------------------------------------------------------------------------------------------- Current treatment: Cycle 4Taxotere and Cytoxan Chemo toxicities: 1.Neutropenia: Not receiving Neulasta or biosimilar, she has been dose reduced.  2. Fatigue-stable  This concludes her chemotherapy. Return to clinic after radiation to start antiestrogen therapy.     No orders of the defined types were placed in this encounter.  The patient has a good understanding  of the overall plan. she agrees with it. she will call with any problems that may develop before the next visit here.  Total time spent: 30 mins including face to face time and time spent for planning, charting and coordination of care  Nicholas Lose, MD 06/16/2019  I, Cloyde Reams Dorshimer, am acting as scribe for Dr. Nicholas Lose.  I have reviewed the above documentation for accuracy and completeness, and I agree with the above.

## 2019-06-16 ENCOUNTER — Inpatient Hospital Stay: Payer: Managed Care, Other (non HMO)

## 2019-06-16 ENCOUNTER — Encounter: Payer: Self-pay | Admitting: Hematology and Oncology

## 2019-06-16 ENCOUNTER — Encounter: Payer: Self-pay | Admitting: *Deleted

## 2019-06-16 ENCOUNTER — Inpatient Hospital Stay: Payer: Managed Care, Other (non HMO) | Attending: Hematology and Oncology

## 2019-06-16 ENCOUNTER — Telehealth: Payer: Self-pay | Admitting: *Deleted

## 2019-06-16 ENCOUNTER — Other Ambulatory Visit: Payer: Self-pay

## 2019-06-16 ENCOUNTER — Inpatient Hospital Stay: Payer: Managed Care, Other (non HMO) | Admitting: Hematology and Oncology

## 2019-06-16 ENCOUNTER — Other Ambulatory Visit: Payer: Self-pay | Admitting: Radiation Oncology

## 2019-06-16 DIAGNOSIS — C50511 Malignant neoplasm of lower-outer quadrant of right female breast: Secondary | ICD-10-CM | POA: Diagnosis not present

## 2019-06-16 DIAGNOSIS — Z5111 Encounter for antineoplastic chemotherapy: Secondary | ICD-10-CM | POA: Insufficient documentation

## 2019-06-16 DIAGNOSIS — Z95828 Presence of other vascular implants and grafts: Secondary | ICD-10-CM

## 2019-06-16 DIAGNOSIS — Z170 Estrogen receptor positive status [ER+]: Secondary | ICD-10-CM

## 2019-06-16 LAB — CMP (CANCER CENTER ONLY)
ALT: 29 U/L (ref 0–44)
AST: 31 U/L (ref 15–41)
Albumin: 3.9 g/dL (ref 3.5–5.0)
Alkaline Phosphatase: 69 U/L (ref 38–126)
Anion gap: 10 (ref 5–15)
BUN: 18 mg/dL (ref 6–20)
CO2: 24 mmol/L (ref 22–32)
Calcium: 9.2 mg/dL (ref 8.9–10.3)
Chloride: 106 mmol/L (ref 98–111)
Creatinine: 0.84 mg/dL (ref 0.44–1.00)
GFR, Est AFR Am: >60 mL/min (ref >60)
GFR, Estimated: >60 mL/min (ref >60)
Glucose, Bld: 98 mg/dL (ref 70–99)
Potassium: 4.4 mmol/L (ref 3.5–5.1)
Sodium: 140 mmol/L (ref 135–145)
Total Bilirubin: 0.5 mg/dL (ref 0.3–1.2)
Total Protein: 7 g/dL (ref 6.5–8.1)

## 2019-06-16 LAB — CBC WITH DIFFERENTIAL (CANCER CENTER ONLY)
Abs Immature Granulocytes: 0.04 10*3/uL (ref 0.00–0.07)
Basophils Absolute: 0.1 10*3/uL (ref 0.0–0.1)
Basophils Relative: 1 %
Eosinophils Absolute: 0 10*3/uL (ref 0.0–0.5)
Eosinophils Relative: 0 %
HCT: 41.7 % (ref 36.0–46.0)
Hemoglobin: 13.4 g/dL (ref 12.0–15.0)
Immature Granulocytes: 1 %
Lymphocytes Relative: 9 %
Lymphs Abs: 0.7 10*3/uL (ref 0.7–4.0)
MCH: 29.1 pg (ref 26.0–34.0)
MCHC: 32.1 g/dL (ref 30.0–36.0)
MCV: 90.7 fL (ref 80.0–100.0)
Monocytes Absolute: 0.5 10*3/uL (ref 0.1–1.0)
Monocytes Relative: 7 %
Neutro Abs: 6.3 10*3/uL (ref 1.7–7.7)
Neutrophils Relative %: 82 %
Platelet Count: 152 10*3/uL (ref 150–400)
RBC: 4.6 MIL/uL (ref 3.87–5.11)
RDW: 13.3 % (ref 11.5–15.5)
WBC Count: 7.6 10*3/uL (ref 4.0–10.5)
nRBC: 0 % (ref 0.0–0.2)

## 2019-06-16 MED ORDER — SODIUM CHLORIDE 0.9% FLUSH
10.0000 mL | INTRAVENOUS | Status: DC | PRN
  Administered 2019-06-16: 10 mL
  Filled 2019-06-16: qty 10

## 2019-06-16 MED ORDER — SODIUM CHLORIDE 0.9 % IV SOLN
10.0000 mg | Freq: Once | INTRAVENOUS | Status: AC
  Administered 2019-06-16: 10 mg via INTRAVENOUS
  Filled 2019-06-16: qty 10

## 2019-06-16 MED ORDER — HEPARIN SOD (PORK) LOCK FLUSH 100 UNIT/ML IV SOLN
500.0000 [IU] | Freq: Once | INTRAVENOUS | Status: AC | PRN
  Administered 2019-06-16: 500 [IU]
  Filled 2019-06-16: qty 5

## 2019-06-16 MED ORDER — PALONOSETRON HCL INJECTION 0.25 MG/5ML
0.2500 mg | Freq: Once | INTRAVENOUS | Status: AC
  Administered 2019-06-16: 0.25 mg via INTRAVENOUS

## 2019-06-16 MED ORDER — PALONOSETRON HCL INJECTION 0.25 MG/5ML
INTRAVENOUS | Status: AC
  Filled 2019-06-16: qty 5

## 2019-06-16 MED ORDER — SODIUM CHLORIDE 0.9 % IV SOLN
70.0000 mg/m2 | Freq: Once | INTRAVENOUS | Status: AC
  Administered 2019-06-16: 120 mg via INTRAVENOUS
  Filled 2019-06-16: qty 12

## 2019-06-16 MED ORDER — SODIUM CHLORIDE 0.9% FLUSH
10.0000 mL | INTRAVENOUS | Status: DC | PRN
  Administered 2019-06-16: 10 mL via INTRAVENOUS
  Filled 2019-06-16: qty 10

## 2019-06-16 MED ORDER — SODIUM CHLORIDE 0.9 % IV SOLN
500.0000 mg/m2 | Freq: Once | INTRAVENOUS | Status: AC
  Administered 2019-06-16: 880 mg via INTRAVENOUS
  Filled 2019-06-16: qty 44

## 2019-06-16 MED ORDER — SODIUM CHLORIDE 0.9 % IV SOLN
Freq: Once | INTRAVENOUS | Status: AC
  Filled 2019-06-16: qty 250

## 2019-06-16 NOTE — Patient Instructions (Signed)

## 2019-06-16 NOTE — Telephone Encounter (Signed)
Gave pt appt date and time to see Dr. [name] for port removal on 4/23 arrive at 11:15am.

## 2019-06-16 NOTE — Assessment & Plan Note (Signed)
01/15/2019:screening mammogram detected area of distortion in the lower central right breast with a possible associated mass. 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 She underwent a lumpectomy with Dr. 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   Genetics were negative Lumpectomy and sentinel node biopsy showed no residual invasive cancer and 5 SLN negative  Treatment plan: 1.Adjuvant chemotherapy with Taxotere and Cytoxan every 3 weeks x4 cyclesfrom 04/14/2019 through 06/16/2019 2. Adjuvant radiation  3. Adjuvant antiestrogen therapy ---------------------------------------------------------------------------------------------------------------------------------------------------------------------- Current treatment: Cycle 4Taxotere and Cytoxan Chemo toxicities: 1.Neutropenia: Not receiving Neulasta or biosimilar, she has been dose reduced.  2. Fatigue-stable  This concludes her chemotherapy. Return to clinic after radiation to start antiestrogen therapy.

## 2019-06-16 NOTE — Patient Instructions (Signed)
[facility] Discharge Instructions for Patients Receiving Chemotherapy  Today you received the following chemotherapy agents: docetaxel and cyclophosphamide.  To help prevent nausea and vomiting after your treatment, we encourage you to take your nausea medication as directed.   If you develop nausea and vomiting that is not controlled by your nausea medication, call the clinic.   BELOW ARE SYMPTOMS THAT SHOULD BE REPORTED IMMEDIATELY:  *FEVER GREATER THAN 100.5 F  *CHILLS WITH OR WITHOUT FEVER  NAUSEA AND VOMITING THAT IS NOT CONTROLLED WITH YOUR NAUSEA MEDICATION  *UNUSUAL SHORTNESS OF BREATH  *UNUSUAL BRUISING OR BLEEDING  TENDERNESS IN MOUTH AND THROAT WITH OR WITHOUT PRESENCE OF ULCERS  *URINARY PROBLEMS  *BOWEL PROBLEMS  UNUSUAL RASH Items with * indicate a potential emergency and should be followed up as soon as possible.  Feel free to call the clinic should you have any questions or concerns. The clinic phone number is [phone].  Please show the CHEMO ALERT CARD at check-in to the Emergency Department and triage nurse.   

## 2019-06-18 ENCOUNTER — Ambulatory Visit: Payer: Managed Care, Other (non HMO)

## 2019-06-29 ENCOUNTER — Ambulatory Visit: Payer: Managed Care, Other (non HMO)

## 2019-07-07 ENCOUNTER — Other Ambulatory Visit: Payer: Self-pay

## 2019-07-07 ENCOUNTER — Ambulatory Visit
Admission: RE | Admit: 2019-07-07 | Discharge: 2019-07-07 | Disposition: A | Discharge status: Home / Self Care | Payer: Managed Care, Other (non HMO) | Source: Ambulatory Visit | Attending: Radiation Oncology | Admitting: Radiation Oncology

## 2019-07-07 DIAGNOSIS — L509 Urticaria, unspecified: Secondary | ICD-10-CM | POA: Diagnosis present

## 2019-07-07 DIAGNOSIS — C50511 Malignant neoplasm of lower-outer quadrant of right female breast: Secondary | ICD-10-CM | POA: Diagnosis present

## 2019-07-07 DIAGNOSIS — Z808 Family history of malignant neoplasm of other organs or systems: Secondary | ICD-10-CM | POA: Diagnosis not present

## 2019-07-07 DIAGNOSIS — Z170 Estrogen receptor positive status [ER+]: Secondary | ICD-10-CM | POA: Insufficient documentation

## 2019-07-07 DIAGNOSIS — Z8052 Family history of malignant neoplasm of bladder: Secondary | ICD-10-CM | POA: Insufficient documentation

## 2019-07-07 DIAGNOSIS — Z510 Encounter for antineoplastic radiation therapy: Secondary | ICD-10-CM | POA: Insufficient documentation

## 2019-07-07 DIAGNOSIS — Z9221 Personal history of antineoplastic chemotherapy: Secondary | ICD-10-CM | POA: Diagnosis not present

## 2019-07-07 DIAGNOSIS — Z8041 Family history of malignant neoplasm of ovary: Secondary | ICD-10-CM | POA: Diagnosis not present

## 2019-07-07 DIAGNOSIS — Z803 Family history of malignant neoplasm of breast: Secondary | ICD-10-CM | POA: Diagnosis not present

## 2019-07-07 DIAGNOSIS — Z801 Family history of malignant neoplasm of trachea, bronchus and lung: Secondary | ICD-10-CM | POA: Diagnosis not present

## 2019-07-07 DIAGNOSIS — Z802 Family history of malignant neoplasm of other respiratory and intrathoracic organs: Secondary | ICD-10-CM | POA: Diagnosis not present

## 2019-07-07 NOTE — Progress Notes (Signed)
I consented the patient today and she confirmed that she took a pregnancy test yesterday at home and this was negative so we decided to forgo lab testing in our center today. She understands that she should use condoms for contraception or discuss further with medical oncology while undergoing radiotherapy.     Carola Rhine, PAC

## 2019-07-09 ENCOUNTER — Encounter: Payer: Self-pay | Admitting: *Deleted

## 2019-07-09 ENCOUNTER — Telehealth: Payer: Self-pay | Admitting: Hematology and Oncology

## 2019-07-09 NOTE — Telephone Encounter (Signed)
Scheduled appt per 5/13 sch message - mailed reminder letter with appt date and time

## 2019-07-10 DIAGNOSIS — Z510 Encounter for antineoplastic radiation therapy: Secondary | ICD-10-CM | POA: Diagnosis not present

## 2019-07-14 ENCOUNTER — Other Ambulatory Visit: Payer: Self-pay

## 2019-07-14 ENCOUNTER — Ambulatory Visit
Admission: RE | Admit: 2019-07-14 | Discharge: 2019-07-14 | Disposition: A | Discharge status: Home / Self Care | Payer: Managed Care, Other (non HMO) | Source: Ambulatory Visit | Attending: Radiation Oncology | Admitting: Radiation Oncology

## 2019-07-14 DIAGNOSIS — Z510 Encounter for antineoplastic radiation therapy: Secondary | ICD-10-CM | POA: Diagnosis not present

## 2019-07-15 ENCOUNTER — Ambulatory Visit
Admission: RE | Admit: 2019-07-15 | Discharge: 2019-07-15 | Disposition: A | Discharge status: Home / Self Care | Payer: Managed Care, Other (non HMO) | Source: Ambulatory Visit | Attending: Radiation Oncology | Admitting: Radiation Oncology

## 2019-07-15 ENCOUNTER — Other Ambulatory Visit: Payer: Self-pay

## 2019-07-15 DIAGNOSIS — Z510 Encounter for antineoplastic radiation therapy: Secondary | ICD-10-CM | POA: Diagnosis not present

## 2019-07-16 ENCOUNTER — Ambulatory Visit
Admission: RE | Admit: 2019-07-16 | Discharge: 2019-07-16 | Disposition: A | Discharge status: Home / Self Care | Payer: Managed Care, Other (non HMO) | Source: Ambulatory Visit | Attending: Radiation Oncology | Admitting: Radiation Oncology

## 2019-07-16 ENCOUNTER — Other Ambulatory Visit: Payer: Self-pay

## 2019-07-16 DIAGNOSIS — Z510 Encounter for antineoplastic radiation therapy: Secondary | ICD-10-CM | POA: Diagnosis not present

## 2019-07-17 ENCOUNTER — Ambulatory Visit
Admission: RE | Admit: 2019-07-17 | Discharge: 2019-07-17 | Disposition: A | Discharge status: Home / Self Care | Payer: Managed Care, Other (non HMO) | Source: Ambulatory Visit | Attending: Radiation Oncology | Admitting: Radiation Oncology

## 2019-07-17 ENCOUNTER — Other Ambulatory Visit: Payer: Self-pay

## 2019-07-17 DIAGNOSIS — Z510 Encounter for antineoplastic radiation therapy: Secondary | ICD-10-CM | POA: Diagnosis not present

## 2019-07-17 DIAGNOSIS — C50511 Malignant neoplasm of lower-outer quadrant of right female breast: Secondary | ICD-10-CM

## 2019-07-17 MED ORDER — SONAFINE EX EMUL
1.0000 "application " | Freq: Once | CUTANEOUS | Status: AC
  Administered 2019-07-17: 1 via TOPICAL

## 2019-07-17 MED ORDER — ALRA NON-METALLIC DEODORANT (RAD-ONC)
1.0000 "application " | Freq: Once | TOPICAL | Status: AC
  Administered 2019-07-17: 1 via TOPICAL

## 2019-07-17 NOTE — Progress Notes (Signed)
Pt here for patient teaching.  Pt given Radiation and You booklet, skin care instructions, Alra deodorant and Sonafine.  Reviewed areas of pertinence such as fatigue, hair loss, skin changes, breast tenderness and breast swelling . Pt able to give teach back of to pat skin and use unscented/gentle soap,apply Sonafine bid, avoid applying anything to skin within 4 hours of treatment, avoid wearing an under wire bra and to use an electric razor if they must shave. Pt verbalizes understanding of information given and will contact nursing with any questions or concerns.     Barre Aydelott M. Kyleen Villatoro RN, BSN             

## 2019-07-20 ENCOUNTER — Ambulatory Visit
Admission: RE | Admit: 2019-07-20 | Discharge: 2019-07-20 | Disposition: A | Discharge status: Home / Self Care | Payer: Managed Care, Other (non HMO) | Source: Ambulatory Visit | Attending: Radiation Oncology | Admitting: Radiation Oncology

## 2019-07-20 ENCOUNTER — Telehealth: Payer: Self-pay | Admitting: *Deleted

## 2019-07-20 ENCOUNTER — Other Ambulatory Visit: Payer: Self-pay

## 2019-07-20 DIAGNOSIS — Z510 Encounter for antineoplastic radiation therapy: Secondary | ICD-10-CM | POA: Diagnosis not present

## 2019-07-20 NOTE — Telephone Encounter (Signed)
Received msg from pt with c/o rash on chest and back that started after her third TC and was intermittent but has not become more persistent. Per Dr. Lindi Adie pt to see Lucianne Lei. Pt notified and high scheduling msg sent.

## 2019-07-21 ENCOUNTER — Ambulatory Visit
Admission: RE | Admit: 2019-07-21 | Discharge: 2019-07-21 | Disposition: A | Discharge status: Home / Self Care | Payer: Managed Care, Other (non HMO) | Source: Ambulatory Visit | Attending: Radiation Oncology | Admitting: Radiation Oncology

## 2019-07-21 ENCOUNTER — Other Ambulatory Visit: Payer: Self-pay

## 2019-07-21 DIAGNOSIS — Z510 Encounter for antineoplastic radiation therapy: Secondary | ICD-10-CM | POA: Diagnosis not present

## 2019-07-22 ENCOUNTER — Ambulatory Visit
Admission: RE | Admit: 2019-07-22 | Discharge: 2019-07-22 | Disposition: A | Discharge status: Home / Self Care | Payer: Managed Care, Other (non HMO) | Source: Ambulatory Visit | Attending: Radiation Oncology | Admitting: Radiation Oncology

## 2019-07-22 ENCOUNTER — Other Ambulatory Visit: Payer: Self-pay

## 2019-07-22 ENCOUNTER — Inpatient Hospital Stay: Payer: Managed Care, Other (non HMO) | Attending: Hematology and Oncology | Admitting: Medical

## 2019-07-22 VITALS — BP 120/77 | HR 77 | Temp 98.7°F | Resp 16 | Wt 148.4 lb

## 2019-07-22 DIAGNOSIS — Z510 Encounter for antineoplastic radiation therapy: Secondary | ICD-10-CM | POA: Diagnosis not present

## 2019-07-22 DIAGNOSIS — Z170 Estrogen receptor positive status [ER+]: Secondary | ICD-10-CM | POA: Diagnosis not present

## 2019-07-22 DIAGNOSIS — L509 Urticaria, unspecified: Secondary | ICD-10-CM

## 2019-07-22 DIAGNOSIS — C50511 Malignant neoplasm of lower-outer quadrant of right female breast: Secondary | ICD-10-CM

## 2019-07-22 MED ORDER — TRIAMCINOLONE ACETONIDE 0.5 % EX CREA: 1.0000 | TOPICAL_CREAM | Freq: Three times a day (TID) | CUTANEOUS | 2 refills | Status: DC

## 2019-07-22 MED ORDER — PREDNISONE 50 MG PO TABS
50.0000 mg | ORAL_TABLET | Freq: Once | ORAL | Status: AC
  Administered 2019-07-22: 50 mg via ORAL

## 2019-07-22 MED ORDER — PREDNISONE 10 MG PO TABS: ORAL_TABLET | ORAL | 0 refills | Status: DC

## 2019-07-22 MED ORDER — PREDNISONE 50 MG PO TABS
ORAL_TABLET | ORAL | Status: AC
  Filled 2019-07-22: qty 1

## 2019-07-22 NOTE — Patient Instructions (Signed)
Rash, Adult A rash is a change in the color of your skin. A rash can also change the way your skin feels. There are many different conditions and factors that can cause a rash. Some rashes may disappear after a few days, but some may last for a few weeks. Common causes of rashes include:  Viral infections, such as: ? Colds. ? Measles. ? Hand, foot, and mouth disease.  Bacterial infections, such as: ? Scarlet fever. ? Impetigo.  Fungal infections, such as Candida.  Allergic reactions to food, medicines, or skin care products. Follow these instructions at home: The goal of treatment is to stop the itching and keep the rash from spreading. Pay attention to any changes in your symptoms. Follow these instructions to help with your condition: Medicine Take or apply over-the-counter and prescription medicines only as told by your health care provider. These may include:  Corticosteroid creams to treat red or swollen skin.  Anti-itch lotions.  Oral allergy medicines (antihistamines).  Oral corticosteroids for severe symptoms.  Skin care  Apply cool compresses to the affected areas.  Do not scratch or rub your skin.  Avoid covering the rash. Make sure the rash is exposed to air as much as possible. Managing itching and discomfort  Avoid hot showers or baths, which can make itching worse. A cold shower may help.  Try taking a bath with: ? Epsom salts. Follow manufacturer instructions on the packaging. You can get these at your local pharmacy or grocery store. ? Baking soda. Pour a small amount into the bath as told by your health care provider. ? Colloidal oatmeal. Follow manufacturer instructions on the packaging. You can get this at your local pharmacy or grocery store.  Try applying baking soda paste to your skin. Stir water into baking soda until it reaches a paste-like consistency.  Try applying calamine lotion. This is an over-the-counter lotion that helps to relieve  itchiness.  Keep cool and out of the sun. Sweating and being hot can make itching worse. General instructions   Rest as needed.  Drink enough fluid to keep your urine pale yellow.  Wear loose-fitting clothing.  Avoid scented soaps, detergents, and perfumes. Use gentle soaps, detergents, perfumes, and other cosmetic products.  Avoid any substance that causes your rash. Keep a journal to help track what causes your rash. Write down: ? What you eat. ? What cosmetic products you use. ? What you drink. ? What you wear. This includes jewelry.  Keep all follow-up visits as told by your health care provider. This is important. Contact a health care provider if:  You sweat at night.  You lose weight.  You urinate more than normal.  You urinate less than normal, or you notice that your urine is a darker color than usual.  You feel weak.  You vomit.  Your skin or the whites of your eyes look yellow (jaundice).  Your skin: ? Tingles. ? Is numb.  Your rash: ? Does not go away after several days. ? Gets worse.  You are: ? Unusually thirsty. ? More tired than normal.  You have: ? New symptoms. ? Pain in your abdomen. ? A fever. ? Diarrhea. Get help right away if you:  Have a fever and your symptoms suddenly get worse.  Develop confusion.  Have a severe headache or a stiff neck.  Have severe joint pains or stiffness.  Have a seizure.  Develop a rash that covers all or most of your body. The rash may   or may not be painful.  Develop blisters that: ? Are on top of the rash. ? Grow larger or grow together. ? Are painful. ? Are inside your nose or mouth.  Develop a rash that: ? Looks like purple pinprick-sized spots all over your body. ? Has a "bull's eye" or looks like a target. ? Is not related to sun exposure, is red and painful, and causes your skin to peel. Summary  A rash is a change in the color of your skin. Some rashes disappear after a few days,  but some may last for a few weeks.  The goal of treatment is to stop the itching and keep the rash from spreading.  Take or apply over-the-counter and prescription medicines only as told by your health care provider.  Contact a health care provider if you have new or worsening symptoms.  Keep all follow-up visits as told by your health care provider. This is important. This information is not intended to replace advice given to you by your health care provider. Make sure you discuss any questions you have with your health care provider. Document Revised: 06/06/2018 Document Reviewed: 09/16/2017 Elsevier Patient Education  2020 Elsevier Inc.  

## 2019-07-23 ENCOUNTER — Ambulatory Visit
Admission: RE | Admit: 2019-07-23 | Discharge: 2019-07-23 | Disposition: A | Discharge status: Home / Self Care | Payer: Managed Care, Other (non HMO) | Source: Ambulatory Visit | Attending: Radiation Oncology | Admitting: Radiation Oncology

## 2019-07-23 ENCOUNTER — Other Ambulatory Visit: Payer: Self-pay

## 2019-07-23 DIAGNOSIS — Z510 Encounter for antineoplastic radiation therapy: Secondary | ICD-10-CM | POA: Diagnosis not present

## 2019-07-23 NOTE — Progress Notes (Signed)
[facility] Progress Note   Terri Hoover [id number] [birth year] 44 y.o.  Terri Hoover is managed by Dr. Nicholas Lose  Actively treated with chemotherapy/immunotherapy/hormonal therapy: The patient is currently treated with radiation.  Next scheduled appointment with provider: 08/17/2019  Assessment: Plan:    Urticaria - Plan: predniSONE (DELTASONE) tablet 50 mg, predniSONE (DELTASONE) 10 MG tablet, triamcinolone cream (KENALOG) 0.5 %  Malignant neoplasm of lower-outer quadrant of right breast of female, estrogen receptor positive (HCC)   Urticaria: The patient was given prednisone 50 mg p.o. x1 and was placed on a prednisone taper which she will begin tomorrow.  Additionally she was given a prescription for triamcinolone cream that she can use 3 times daily.  ER positive malignant neoplasm of the right breast: The patient is followed by Dr. Nicholas Lose and is status post chemotherapy with Taxotere and Cytoxan.  She is currently receiving radiation therapy.  She will see Dr. Lindi Adie in follow-up on 08/17/2019.  Please see After Visit Summary for patient specific instructions.  Future Appointments  Date Time Provider [place]  07/24/2019 11:00 AM [facility] LINAC 3 [facility] None  07/28/2019 10:15 AM [facility] LINAC 3 [facility] None  07/29/2019  9:00 AM [facility] LINAC 3 [facility] None  07/30/2019  9:00 AM [facility] LINAC 3 [facility] None  07/31/2019  9:00 AM [facility] LINAC 3 [facility] None  08/03/2019  9:00 AM [facility] LINAC 3 [facility] None  08/04/2019  9:00 AM [facility] LINAC 3 [facility] None  08/05/2019  9:00 AM [facility] LINAC 3 [facility] None  08/06/2019  9:00 AM [facility] LINAC 3 [facility] None  08/07/2019  9:00 AM [facility] LINAC 3 [facility] None  08/10/2019  9:00 AM [facility] LINAC 3 [facility] None  08/11/2019  9:00 AM [facility] LINAC 3 [facility] None  08/12/2019  9:00 AM [facility] LINAC 3 [facility]  None  08/13/2019  9:00 AM [facility] LINAC 3 [facility] None  08/14/2019  9:00 AM [facility] LINAC 3 [facility] None  08/17/2019  9:00 AM [facility] LINAC 3 [facility] None  08/17/2019  9:45 AM Nicholas Lose, MD [facility] None  08/18/2019  9:00 AM [facility] LINAC 3 [facility] None  08/19/2019  9:00 AM [facility] LINAC 3 [facility] None  08/20/2019  9:00 AM [facility] LINAC 3 [facility] None  08/21/2019  9:00 AM [facility] LINAC 3 [facility] None  08/21/2019  9:10 AM Kyung Rudd, MD [facility] None  08/24/2019  9:00 AM [facility] LINAC 3 [facility] None  08/25/2019  9:00 AM [facility] LINAC 3 [facility] None  08/26/2019  9:00 AM [facility] LINAC 3 [facility] None  08/27/2019  9:00 AM [facility] LINAC 3 [facility] None  08/28/2019  9:00 AM [facility] LINAC 3 [facility] None    No orders of the defined types were placed in this encounter.      Subjective:   Patient ID:  Terri Hoover is a 44 y.o. (DOB 11-02-1975) female.  Chief Complaint:  Chief Complaint  Patient presents with  . Rash    HPI Terri Hoover  is a 44 y.o. female with a diagnosis of ER positive malignant neoplasm of the right breast.  She is followed by Dr. Nicholas Lose and is status post chemotherapy with Taxotere and Cytoxan.  She is currently receiving radiation therapy.  She presents to the clinic today with a rash that has been in various locations over her body including her chest, back, proximal thighs, and upper extremities for the past 3 weeks.  The areas are itching, red, and blotchy.  She is used hydrocortisone cream  and oral Benadryl with only incomplete benefits.  She denies fevers, chills, sweats, chest pain, shortness of breath, or trouble swallowing.  Medications: I have reviewed the patient's current medications.  Allergies: No Known Allergies  Past Medical History:  Diagnosis Date  . Anxiety   . ASCUS of cervix with negative high risk HPV 09/2018  . Family history of breast  cancer   . Family history of esophageal cancer   . Family history of lung cancer   . Family history of melanoma   . Family history of ovarian cancer   . Family history of thyroid cancer   . Frequency of urination   . Hashimoto's disease   . History of TMJ disorder   . Hypothyroidism     Past Surgical History:  Procedure Laterality Date  . BREAST BIOPSY Right 2016   benign  . BREAST BIOPSY Right 2019  . BREAST LUMPECTOMY WITH SENTINEL LYMPH NODE BIOPSY Right 03/17/2019   Procedure: RE EXCISION OF RIGHT BREAST LUMPECTOMY WITH RIGHT AXILLARY SENTINEL LYMPH NODE BIOPSY AND BLUE DYE INJECTION;  Surgeon: Rolm Bookbinder, MD;  Location: [place];  Service: General;  Laterality: Right;  . COLPOSCOPY  2000  . CYSTO WITH HYDRODISTENSION N/A 05/06/2013   Procedure: CYSTOSCOPY/HYDRODISTENSION WITH TRIGGER POINT INJECTION ;  Surgeon: Alexis Frock, MD;  Location[facility];  Service: Urology;  Laterality: N/A;  . DILATION AND CURETTAGE OF UTERUS  09-04-2002   W/  SUCTION FOR POSTPARTUM RETAINED PRODUCTS  . DX LAPAROSCOPY /  LASER ABLATION ENDOMETRIOSIS  2002  . PORTACATH PLACEMENT Right 03/17/2019   Procedure: INSERTION PORT-A-CATH WITH ULTRASOUND;  Surgeon: Rolm Bookbinder, MD;  Location: [place];  Service: General;  Laterality: Right;  . RADIOACTIVE SEED GUIDED EXCISIONAL BREAST BIOPSY Right 02/10/2019   Procedure: RADIOACTIVE SEED GUIDED EXCISIONAL RIGHT BREAST BIOPSY;  Surgeon: Rolm Bookbinder, MD;  Location: [place];  Service: General;  Laterality: Right;  . TEMPOROMANDIBULAR JOINT SURGERY  2000    Family History  Problem Relation Age of Onset  . Heart disease Father   . Breast cancer Maternal Aunt 35  . Breast cancer Maternal Grandmother 85       recurred at age 64  . Esophageal cancer Paternal Uncle 61  . Thyroid cancer Maternal Grandfather        dx. in his 64s  . Melanoma Paternal Grandmother        dx.  in her 68s  . Lung cancer Paternal Grandfather        dx. in his 28s  . Bladder Cancer Paternal Grandfather 33  . Breast cancer Other 70       maternal grandmother's sister  . Breast cancer Other 50       paternal 4th degree relative  . Liver cancer Other 55  . Ovarian cancer Other 37       paternal 5th degree relative    Social History   Socioeconomic History  . Marital status: Married    Spouse name: Not on file  . Number of children: Not on file  . Years of education: Not on file  . Highest education level: Not on file  Occupational History  . Not on file  Tobacco Use  . Smoking status: Never Smoker  . Smokeless tobacco: Never Used  Substance and Sexual Activity  . Alcohol use: Yes    Alcohol/week: 0.0 standard drinks    Comment: OCCASIONALLY  . Drug use: No  . Sexual activity:  Yes    Birth control/protection: None  Other Topics Concern  . Not on file  Social History Narrative  . Not on file   Social Determinants of Health   Financial Resource Strain:   . Difficulty of Paying Living Expenses:   Food Insecurity:   . Worried About Charity fundraiser in the Last Year:   . Arboriculturist in the Last Year:   Transportation Needs:   . Film/video editor (Medical):   Marland Kitchen Lack of Transportation (Non-Medical):   Physical Activity:   . Days of Exercise per Week:   . Minutes of Exercise per Session:   Stress:   . Feeling of Stress :   Social Connections:   . Frequency of Communication with Friends and Family:   . Frequency of Social Gatherings with Friends and Family:   . Attends Religious Services:   . Active Member of Clubs or Organizations:   . Attends Archivist Meetings:   Marland Kitchen Marital Status:   Intimate Partner Violence:   . Fear of Current or Ex-Partner:   . Emotionally Abused:   Marland Kitchen Physically Abused:   . Sexually Abused:     Past Medical History, Surgical history, Social history, and Family history were reviewed and updated as appropriate.     Please see review of systems for further details on the patient's review from today.   Review of Systems:  Review of Systems  Constitutional: Negative for chills, diaphoresis and fever.  HENT: Negative for facial swelling and trouble swallowing.   Respiratory: Negative for cough, chest tightness and shortness of breath.   Cardiovascular: Negative for chest pain and palpitations.  Skin: Positive for rash.    Objective:   Physical Exam:  BP 120/77 (BP Location: Left Arm, Patient Position: Sitting)   Pulse 77   Temp 98.7 F (37.1 C) (Oral)   Resp 16   Wt 67.3 kg (148 lb 6.4 oz)   SpO2 99%   BMI 24.70 kg/m  ECOG: 0  Physical Exam Constitutional:      Appearance: Normal appearance.  HENT:     Head: Normocephalic.  Eyes:     General: No scleral icterus.       Right eye: No discharge.        Left eye: No discharge.     Conjunctiva/sclera: Conjunctivae normal.  Cardiovascular:     Rate and Rhythm: Normal rate and regular rhythm.     Heart sounds: No murmur. No friction rub. No gallop.   Pulmonary:     Effort: Pulmonary effort is normal. No respiratory distress.     Breath sounds: Normal breath sounds. No wheezing or rales.  Skin:    Findings: Erythema and rash present.     Comments: Multiple coalesced raised wheals are noted in the lower back.  The areas are erythematous.  Neurological:     Mental Status: She is alert.  Psychiatric:        Mood and Affect: Mood normal.        Behavior: Behavior normal.        Thought Content: Thought content normal.        Judgment: Judgment normal.     Lab Review:     Component Value Date/Time   NA 140 06/16/2019 1135   K 4.4 06/16/2019 1135   CL 106 06/16/2019 1135   CO2 24 06/16/2019 1135   GLUCOSE 98 06/16/2019 1135   BUN 18 06/16/2019 1135   CREATININE 0.84 06/16/2019  1135   CREATININE 0.81 10/14/2018 1016   CALCIUM 9.2 06/16/2019 1135   PROT 7.0 06/16/2019 1135   ALBUMIN 3.9 06/16/2019 1135   AST 31 06/16/2019  1135   ALT 29 06/16/2019 1135   ALKPHOS 69 06/16/2019 1135   BILITOT 0.5 06/16/2019 1135   GFRNONAA >60 06/16/2019 1135   GFRAA >60 06/16/2019 1135       Component Value Date/Time   WBC 7.6 06/16/2019 1135   WBC 6.8 10/14/2018 1016   RBC 4.60 06/16/2019 1135   HGB 13.4 06/16/2019 1135   HCT 41.7 06/16/2019 1135   PLT 152 06/16/2019 1135   MCV 90.7 06/16/2019 1135   MCH 29.1 06/16/2019 1135   MCHC 32.1 06/16/2019 1135   RDW 13.3 06/16/2019 1135   LYMPHSABS 0.7 06/16/2019 1135   MONOABS 0.5 06/16/2019 1135   EOSABS 0.0 06/16/2019 1135   BASOSABS 0.1 06/16/2019 1135   -------------------------------  Imaging from last 24 hours (if applicable):  Radiology interpretation: No results found.

## 2019-07-24 ENCOUNTER — Other Ambulatory Visit: Payer: Self-pay

## 2019-07-24 ENCOUNTER — Ambulatory Visit
Admission: RE | Admit: 2019-07-24 | Discharge: 2019-07-24 | Disposition: A | Discharge status: Home / Self Care | Payer: Managed Care, Other (non HMO) | Source: Ambulatory Visit | Attending: Radiation Oncology | Admitting: Radiation Oncology

## 2019-07-24 DIAGNOSIS — Z510 Encounter for antineoplastic radiation therapy: Secondary | ICD-10-CM | POA: Diagnosis not present

## 2019-07-28 ENCOUNTER — Other Ambulatory Visit: Payer: Self-pay

## 2019-07-28 ENCOUNTER — Ambulatory Visit
Admission: RE | Admit: 2019-07-28 | Discharge: 2019-07-28 | Disposition: A | Discharge status: Home / Self Care | Payer: Managed Care, Other (non HMO) | Source: Ambulatory Visit | Attending: Radiation Oncology | Admitting: Radiation Oncology

## 2019-07-28 DIAGNOSIS — Z170 Estrogen receptor positive status [ER+]: Secondary | ICD-10-CM | POA: Insufficient documentation

## 2019-07-28 DIAGNOSIS — Z923 Personal history of irradiation: Secondary | ICD-10-CM | POA: Diagnosis not present

## 2019-07-28 DIAGNOSIS — L509 Urticaria, unspecified: Secondary | ICD-10-CM | POA: Diagnosis not present

## 2019-07-28 DIAGNOSIS — C50511 Malignant neoplasm of lower-outer quadrant of right female breast: Secondary | ICD-10-CM | POA: Insufficient documentation

## 2019-07-28 DIAGNOSIS — Z79899 Other long term (current) drug therapy: Secondary | ICD-10-CM | POA: Diagnosis not present

## 2019-07-29 ENCOUNTER — Ambulatory Visit
Admission: RE | Admit: 2019-07-29 | Discharge: 2019-07-29 | Disposition: A | Discharge status: Home / Self Care | Payer: Managed Care, Other (non HMO) | Source: Ambulatory Visit | Attending: Radiation Oncology | Admitting: Radiation Oncology

## 2019-07-29 ENCOUNTER — Other Ambulatory Visit: Payer: Self-pay

## 2019-07-29 DIAGNOSIS — C50511 Malignant neoplasm of lower-outer quadrant of right female breast: Secondary | ICD-10-CM | POA: Diagnosis not present

## 2019-07-30 ENCOUNTER — Other Ambulatory Visit: Payer: Self-pay

## 2019-07-30 ENCOUNTER — Ambulatory Visit
Admission: RE | Admit: 2019-07-30 | Discharge: 2019-07-30 | Disposition: A | Discharge status: Home / Self Care | Payer: Managed Care, Other (non HMO) | Source: Ambulatory Visit | Attending: Radiation Oncology | Admitting: Radiation Oncology

## 2019-07-30 DIAGNOSIS — C50511 Malignant neoplasm of lower-outer quadrant of right female breast: Secondary | ICD-10-CM | POA: Diagnosis not present

## 2019-07-31 ENCOUNTER — Other Ambulatory Visit: Payer: Self-pay

## 2019-07-31 ENCOUNTER — Ambulatory Visit
Admission: RE | Admit: 2019-07-31 | Discharge: 2019-07-31 | Disposition: A | Discharge status: Home / Self Care | Payer: Managed Care, Other (non HMO) | Source: Ambulatory Visit | Attending: Radiation Oncology | Admitting: Radiation Oncology

## 2019-07-31 DIAGNOSIS — Z170 Estrogen receptor positive status [ER+]: Secondary | ICD-10-CM

## 2019-07-31 DIAGNOSIS — C50511 Malignant neoplasm of lower-outer quadrant of right female breast: Secondary | ICD-10-CM

## 2019-07-31 MED ORDER — SONAFINE EX EMUL
1.0000 "application " | Freq: Once | CUTANEOUS | Status: AC
  Administered 2019-07-31: 1 via TOPICAL

## 2019-08-03 ENCOUNTER — Ambulatory Visit
Admission: RE | Admit: 2019-08-03 | Discharge: 2019-08-03 | Disposition: A | Discharge status: Home / Self Care | Payer: Managed Care, Other (non HMO) | Source: Ambulatory Visit | Attending: Radiation Oncology | Admitting: Radiation Oncology

## 2019-08-03 ENCOUNTER — Other Ambulatory Visit: Payer: Self-pay

## 2019-08-03 DIAGNOSIS — C50511 Malignant neoplasm of lower-outer quadrant of right female breast: Secondary | ICD-10-CM | POA: Diagnosis not present

## 2019-08-04 ENCOUNTER — Ambulatory Visit
Admission: RE | Admit: 2019-08-04 | Discharge: 2019-08-04 | Disposition: A | Discharge status: Home / Self Care | Payer: Managed Care, Other (non HMO) | Source: Ambulatory Visit | Attending: Radiation Oncology | Admitting: Radiation Oncology

## 2019-08-04 ENCOUNTER — Other Ambulatory Visit: Payer: Self-pay

## 2019-08-04 DIAGNOSIS — C50511 Malignant neoplasm of lower-outer quadrant of right female breast: Secondary | ICD-10-CM | POA: Diagnosis not present

## 2019-08-05 ENCOUNTER — Other Ambulatory Visit: Payer: Self-pay

## 2019-08-05 ENCOUNTER — Ambulatory Visit
Admission: RE | Admit: 2019-08-05 | Discharge: 2019-08-05 | Disposition: A | Discharge status: Home / Self Care | Payer: Managed Care, Other (non HMO) | Source: Ambulatory Visit | Attending: Radiation Oncology | Admitting: Radiation Oncology

## 2019-08-05 DIAGNOSIS — C50511 Malignant neoplasm of lower-outer quadrant of right female breast: Secondary | ICD-10-CM | POA: Diagnosis not present

## 2019-08-06 ENCOUNTER — Other Ambulatory Visit: Payer: Self-pay

## 2019-08-06 ENCOUNTER — Ambulatory Visit
Admission: RE | Admit: 2019-08-06 | Discharge: 2019-08-06 | Disposition: A | Discharge status: Home / Self Care | Payer: Managed Care, Other (non HMO) | Source: Ambulatory Visit | Attending: Radiation Oncology | Admitting: Radiation Oncology

## 2019-08-06 DIAGNOSIS — C50511 Malignant neoplasm of lower-outer quadrant of right female breast: Secondary | ICD-10-CM | POA: Diagnosis not present

## 2019-08-07 ENCOUNTER — Ambulatory Visit
Admission: RE | Admit: 2019-08-07 | Discharge: 2019-08-07 | Disposition: A | Discharge status: Home / Self Care | Payer: Managed Care, Other (non HMO) | Source: Ambulatory Visit | Attending: Radiation Oncology | Admitting: Radiation Oncology

## 2019-08-07 ENCOUNTER — Other Ambulatory Visit: Payer: Self-pay

## 2019-08-07 DIAGNOSIS — C50511 Malignant neoplasm of lower-outer quadrant of right female breast: Secondary | ICD-10-CM | POA: Diagnosis not present

## 2019-08-10 ENCOUNTER — Ambulatory Visit
Admission: RE | Admit: 2019-08-10 | Discharge: 2019-08-10 | Disposition: A | Discharge status: Home / Self Care | Payer: Managed Care, Other (non HMO) | Source: Ambulatory Visit | Attending: Radiation Oncology | Admitting: Radiation Oncology

## 2019-08-10 ENCOUNTER — Other Ambulatory Visit: Payer: Self-pay

## 2019-08-10 DIAGNOSIS — C50511 Malignant neoplasm of lower-outer quadrant of right female breast: Secondary | ICD-10-CM | POA: Diagnosis not present

## 2019-08-11 ENCOUNTER — Other Ambulatory Visit: Payer: Self-pay

## 2019-08-11 ENCOUNTER — Ambulatory Visit
Admission: RE | Admit: 2019-08-11 | Discharge: 2019-08-11 | Disposition: A | Discharge status: Home / Self Care | Payer: Managed Care, Other (non HMO) | Source: Ambulatory Visit | Attending: Radiation Oncology | Admitting: Radiation Oncology

## 2019-08-11 DIAGNOSIS — C50511 Malignant neoplasm of lower-outer quadrant of right female breast: Secondary | ICD-10-CM | POA: Diagnosis not present

## 2019-08-11 DIAGNOSIS — L509 Urticaria, unspecified: Secondary | ICD-10-CM | POA: Insufficient documentation

## 2019-08-11 DIAGNOSIS — Z79899 Other long term (current) drug therapy: Secondary | ICD-10-CM | POA: Insufficient documentation

## 2019-08-11 DIAGNOSIS — Z923 Personal history of irradiation: Secondary | ICD-10-CM | POA: Insufficient documentation

## 2019-08-11 DIAGNOSIS — Z170 Estrogen receptor positive status [ER+]: Secondary | ICD-10-CM | POA: Insufficient documentation

## 2019-08-12 ENCOUNTER — Other Ambulatory Visit: Payer: Self-pay

## 2019-08-12 ENCOUNTER — Ambulatory Visit
Admission: RE | Admit: 2019-08-12 | Discharge: 2019-08-12 | Disposition: A | Discharge status: Home / Self Care | Payer: Managed Care, Other (non HMO) | Source: Ambulatory Visit | Attending: Radiation Oncology | Admitting: Radiation Oncology

## 2019-08-12 DIAGNOSIS — C50511 Malignant neoplasm of lower-outer quadrant of right female breast: Secondary | ICD-10-CM | POA: Diagnosis not present

## 2019-08-13 ENCOUNTER — Other Ambulatory Visit: Payer: Self-pay

## 2019-08-13 ENCOUNTER — Ambulatory Visit
Admission: RE | Admit: 2019-08-13 | Discharge: 2019-08-13 | Disposition: A | Discharge status: Home / Self Care | Payer: Managed Care, Other (non HMO) | Source: Ambulatory Visit | Attending: Radiation Oncology | Admitting: Radiation Oncology

## 2019-08-13 DIAGNOSIS — C50511 Malignant neoplasm of lower-outer quadrant of right female breast: Secondary | ICD-10-CM | POA: Diagnosis not present

## 2019-08-14 ENCOUNTER — Other Ambulatory Visit: Payer: Self-pay

## 2019-08-14 ENCOUNTER — Ambulatory Visit
Admission: RE | Admit: 2019-08-14 | Discharge: 2019-08-14 | Disposition: A | Discharge status: Home / Self Care | Payer: Managed Care, Other (non HMO) | Source: Ambulatory Visit | Attending: Radiation Oncology | Admitting: Radiation Oncology

## 2019-08-14 DIAGNOSIS — C50511 Malignant neoplasm of lower-outer quadrant of right female breast: Secondary | ICD-10-CM | POA: Diagnosis not present

## 2019-08-16 NOTE — Progress Notes (Signed)
Patient Care Team: Patient, No Pcp Per as PCP - General (General Practice) Mauro Kaufmann, RN as Oncology Nurse Navigator Rockwell [place], RN as Oncology Nurse Navigator Nicholas Lose, MD as Consulting Physician (Hematology and Oncology) Rolm Bookbinder, MD as Consulting Physician (General Surgery)  DIAGNOSIS:    ICD-10-CM   1. Malignant neoplasm of lower-outer quadrant of right breast of female, estrogen receptor positive ([place])  C50.511    Z17.0     SUMMARY OF ONCOLOGIC HISTORY: Oncology History  Malignant neoplasm of lower-outer quadrant of right breast of female, estrogen receptor positive ([place])  01/15/2019 Initial Diagnosis   Screening mammogram detected an area of distortion in the lower central right breast with a possible associated mass. 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 Lumpectomy showed IDC.    02/10/2019 Surgery   Right lumpectomy [name]): IDC, grade 1, 0.8cm, involved lateral margin, HER-2 equivocal (2+) by IHC, negative by FISH, ER+ 50%, PR+ 50%, Ki67 10%   02/10/2019 Cancer Staging   Staging form: Breast, AJCC 8th Edition - Pathologic stage from 02/10/2019: Stage IA (pT1b, pN0, cM0, G1, ER+, PR+, HER2-, Oncotype DX score: 24) - Signed by Gardenia Phlegm, NP on 05/25/2019   03/04/2019 Oncotype testing   Score of 24/10%; about a 6.5% benefit from chemotherapy   03/09/2019 Genetic Testing   Negative genetic testing:  No pathogenic variants detected on the [facility] Breast Cancer STAT panel or the Common Hereditary Cancers panel. The report date is 03/09/19.  The Breast Cancer STAT panel offered by [facility] includes sequencing and rearrangement analysis for the following 9 genes:  ATM, BRCA1, BRCA2, CDH1, CHEK2, PALB2, PTEN, STK11 and TP53.  The Common Hereditary Cancers Panel offered by [facility] includes sequencing and/or deletion duplication testing of the following 48 genes: APC, ATM, AXIN2, BARD1, BMPR1A, BRCA1, BRCA2, BRIP1, CDH1,  CDK4, CDKN2A (p14ARF), CDKN2A (p16INK4a), CHEK2, CTNNA1, DICER1, EPCAM (Deletion/duplication testing only), GREM1 (promoter region deletion/duplication testing only), KIT, MEN1, MLH1, MSH2, MSH3, MSH6, MUTYH, NBN, NF1, NHTL1, PALB2, PDGFRA, PMS2, POLD1, POLE, PTEN, RAD50, RAD51C, RAD51D, RNF43, SDHB, SDHC, SDHD, SMAD4, SMARCA4. STK11, TP53, TSC1, TSC2, and VHL.  The following genes were evaluated for sequence changes only: SDHA and HOXB13 c.251G>A variant only.    03/17/2019 Surgery   Right lumpectomy [name]): no residual carcinoma and 5 right axillary lymph nodes negative   04/14/2019 - 06/16/2019 Adjuvant Chemotherapy   Taxotere and Cytoxan x 4   07/15/2019 -  Radiation Therapy   Adjuvant radiation     CHIEF COMPLIANT: Follow-up to discuss antiestrogen therapy  INTERVAL HISTORY: Terri Hoover is a 44 y.o. with above-mentioned history of right breast cancer who underwent a rightlumpectomy, adjuvant chemotherapy, and is currently undergoing radiation.She presents to the clinic todayto discuss antiestrogen therapy.    ALLERGIES:  has No Known Allergies.  MEDICATIONS:  Current Outpatient Medications  Medication Sig Dispense Refill  . ALPRAZolam (XANAX) 0.25 MG tablet Take 1 tablet (0.25 mg total) by mouth at bedtime as needed for anxiety. 30 tablet 2  . FLUoxetine (PROZAC) 40 MG capsule Take 1 capsule (40 mg total) by mouth daily. 90 capsule 3  . levothyroxine (SYNTHROID) 75 MCG tablet Take 75 mcg by mouth daily before breakfast.    . lidocaine-prilocaine (EMLA) cream Apply to affected area once 30 g 3  . liothyronine (CYTOMEL) 5 MCG tablet Take 5 mcg by mouth daily.    Marland Kitchen LORazepam (ATIVAN) 0.5 MG tablet Take 1 tablet (0.5 mg total) by mouth at bedtime as  needed for sleep. 30 tablet 0  . Magnesium 500 MG TABS Take by mouth.    . Multiple Vitamin (MULTIVITAMIN) tablet Take 1 tablet by mouth daily.    . ondansetron (ZOFRAN) 8 MG tablet Take 1 tablet (8 mg total) by mouth 2 (two)  times daily as needed for refractory nausea / vomiting. Start on day 3 after chemo. (Patient not taking: Reported on 06/16/2019) 30 tablet 1  . predniSONE (DELTASONE) 10 MG tablet 5 tab x 2 days, 4 tab x 2 days, 3 tab x 2 days, 2 tab x 2 days, 1 tab x 2 days, 1/2 tab x 2 days 31 tablet 0  . prochlorperazine (COMPAZINE) 10 MG tablet Take 1 tablet (10 mg total) by mouth every 6 (six) hours as needed (Nausea or vomiting). (Patient not taking: Reported on 06/16/2019) 30 tablet 1  . triamcinolone cream (KENALOG) 0.5 % Apply 1 application topically 3 (three) times daily. 60 g 2  . trimethoprim-polymyxin b (POLYTRIM) ophthalmic solution 1 drop every 3 (three) hours.     No current facility-administered medications for this visit.    PHYSICAL EXAMINATION: ECOG PERFORMANCE STATUS: 1 - Symptomatic but completely ambulatory  Vitals:   08/17/19 0929  BP: (!) 124/56  Pulse: 67  Resp: 17  Temp: 98.7 F (37.1 C)  SpO2: 99%   Filed Weights   08/17/19 0929  Weight: 146 lb 8 oz (66.5 kg)    LABORATORY DATA:  I have reviewed the data as listed CMP Latest Ref Rng & Units 06/16/2019 05/26/2019 05/05/2019  Glucose 70 - 99 mg/dL 98 78 82  BUN 6 - 20 mg/dL 18 16 15   Creatinine 0.44 - 1.00 mg/dL 0.84 0.79 0.79  Sodium 135 - 145 mmol/L 140 140 138  Potassium 3.5 - 5.1 mmol/L 4.4 4.0 3.6  Chloride 98 - 111 mmol/L 106 107 103  CO2 22 - 32 mmol/L 24 26 28   Calcium 8.9 - 10.3 mg/dL 9.2 9.0 9.1  Total Protein 6.5 - 8.1 g/dL 7.0 6.7 6.8  Total Bilirubin 0.3 - 1.2 mg/dL 0.5 0.6 0.6  Alkaline Phos 38 - 126 U/L 69 59 63  AST 15 - 41 U/L 31 22 17   ALT 0 - 44 U/L 29 23 19     Lab Results  Component Value Date   WBC 7.6 06/16/2019   HGB 13.4 06/16/2019   HCT 41.7 06/16/2019   MCV 90.7 06/16/2019   PLT 152 06/16/2019   NEUTROABS 6.3 06/16/2019    ASSESSMENT & PLAN:  Malignant neoplasm of lower-outer quadrant of right breast of female, estrogen receptor positive ([place]) 01/15/2019:screening mammogram detected  area of distortion in the lower central right breast with a possible associated mass. 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 She underwent a lumpectomy with Dr. 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  Genetics were negative Lumpectomy and sentinel node biopsy showed no residual invasive cancer and 5 SLN negative  Treatment plan: 1.Adjuvant chemotherapy with Taxotere and Cytoxan every 3 weeks x4 cyclesfrom2/16/2021through 06/16/2019 2. Adjuvant radiation 07/15/2019 3. Adjuvant antiestrogen therapy ---------------------------------------------------------------------------------------------------------------------------------------------------------------------- Treatment plan: Adjuvant antiestrogen therapy with tamoxifen 20 mg daily x10 years Tamoxifen counseling:We discussed the risks and benefits of tamoxifen. These include but not limited to insomnia, hot flashes, mood  changes, vaginal dryness, and weight gain. Although rare, serious side effects including endometrial cancer, risk of blood clots were also discussed. We strongly believe that the benefits far outweigh the risks. Patient understands these risks and consented to starting treatment. Planned treatment duration is 10 years.  Patient is on Prozac.  I discussed with her about the interaction with tamoxifen but she has been on it for a long time and I instructed her that if we could switch her to Effexor it would be better. She will read more about it and inform me.  Her surveillance plan will include mammograms November and breast MRIs in May of every year.  Patient was counseled about antiestrogen therapy compliance and adherence  study. Return to clinic in 3 months for survivorship care plan visit.   No orders of the defined types were placed in this encounter.  The patient has a good understanding of the overall plan. she agrees with it. she will call with any problems that may develop before the next visit here.  Total time spent: 30 mins including face to face time and time spent for planning, charting and coordination of care  Nicholas Lose, MD 08/17/2019  I, Cloyde Reams Dorshimer, am acting as scribe for Dr. Nicholas Lose.  I have reviewed the above documentation for accuracy and completeness, and I agree with the above.

## 2019-08-17 ENCOUNTER — Other Ambulatory Visit: Payer: Self-pay | Admitting: Adult Health

## 2019-08-17 ENCOUNTER — Other Ambulatory Visit: Payer: Self-pay

## 2019-08-17 ENCOUNTER — Ambulatory Visit
Admission: RE | Admit: 2019-08-17 | Discharge: 2019-08-17 | Disposition: A | Discharge status: Home / Self Care | Payer: Managed Care, Other (non HMO) | Source: Ambulatory Visit | Attending: Radiation Oncology | Admitting: Radiation Oncology

## 2019-08-17 ENCOUNTER — Inpatient Hospital Stay: Payer: Managed Care, Other (non HMO) | Attending: Hematology and Oncology | Admitting: Hematology and Oncology

## 2019-08-17 ENCOUNTER — Encounter: Payer: Self-pay | Admitting: *Deleted

## 2019-08-17 DIAGNOSIS — C50511 Malignant neoplasm of lower-outer quadrant of right female breast: Secondary | ICD-10-CM | POA: Diagnosis not present

## 2019-08-17 DIAGNOSIS — Z170 Estrogen receptor positive status [ER+]: Secondary | ICD-10-CM | POA: Diagnosis not present

## 2019-08-17 MED ORDER — TAMOXIFEN CITRATE 20 MG PO TABS
20.0000 mg | ORAL_TABLET | Freq: Every day | ORAL | 3 refills | Status: DC
Start: 2019-09-10 — End: 2019-11-18

## 2019-08-17 NOTE — Assessment & Plan Note (Signed)
01/15/2019:screening mammogram detected area of distortion in the lower central right breast with a possible associated mass. 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 She underwent a lumpectomy with Dr. 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  Genetics were negative Lumpectomy and sentinel node biopsy showed no residual invasive cancer and 5 SLN negative  Treatment plan: 1.Adjuvant chemotherapy with Taxotere and Cytoxan every 3 weeks x4 cyclesfrom2/16/2021through 06/16/2019 2. Adjuvant radiation 07/15/2019 3. Adjuvant antiestrogen therapy ---------------------------------------------------------------------------------------------------------------------------------------------------------------------- Treatment plan: Adjuvant antiestrogen therapy with tamoxifen 20 mg daily x10 years Tamoxifen counseling:We discussed the risks and benefits of tamoxifen. These include but not limited to insomnia, hot flashes, mood changes, vaginal dryness, and weight gain. Although rare, serious side effects including endometrial cancer, risk of blood clots were also discussed. We strongly believe that the benefits far outweigh the risks. Patient understands these risks and consented to starting treatment. Planned treatment duration is 10 years.  Patient was counseled about antiestrogen therapy compliance and adherence study. Return to clinic in 3 months for survivorship care plan visit.

## 2019-08-17 NOTE — Progress Notes (Signed)
Called patient to review AET monitoring study.  Could not leave voice mail as it was full.    [name], NP

## 2019-08-18 ENCOUNTER — Other Ambulatory Visit: Payer: Self-pay | Admitting: *Deleted

## 2019-08-18 ENCOUNTER — Ambulatory Visit
Admission: RE | Admit: 2019-08-18 | Discharge: 2019-08-18 | Disposition: A | Discharge status: Home / Self Care | Payer: Managed Care, Other (non HMO) | Source: Ambulatory Visit | Attending: Radiation Oncology | Admitting: Radiation Oncology

## 2019-08-18 ENCOUNTER — Other Ambulatory Visit: Payer: Self-pay

## 2019-08-18 DIAGNOSIS — C50511 Malignant neoplasm of lower-outer quadrant of right female breast: Secondary | ICD-10-CM | POA: Diagnosis not present

## 2019-08-18 MED ORDER — VENLAFAXINE HCL ER 37.5 MG PO CP24
37.5000 mg | ORAL_CAPSULE | Freq: Every day | ORAL | 6 refills | Status: DC
Start: 2019-08-18 — End: 2019-11-18

## 2019-08-18 NOTE — Telephone Encounter (Signed)
Pt given instructions on Prozac taper and the initiation of Effexor.

## 2019-08-19 ENCOUNTER — Ambulatory Visit
Admission: RE | Admit: 2019-08-19 | Discharge: 2019-08-19 | Disposition: A | Discharge status: Home / Self Care | Payer: Managed Care, Other (non HMO) | Source: Ambulatory Visit | Attending: Radiation Oncology | Admitting: Radiation Oncology

## 2019-08-19 ENCOUNTER — Telehealth: Payer: Self-pay | Admitting: Hematology and Oncology

## 2019-08-19 ENCOUNTER — Other Ambulatory Visit: Payer: Self-pay

## 2019-08-19 DIAGNOSIS — C50511 Malignant neoplasm of lower-outer quadrant of right female breast: Secondary | ICD-10-CM | POA: Diagnosis not present

## 2019-08-19 NOTE — Telephone Encounter (Signed)
Scheduled per 6/21 los. Called patient no msg and unable to leave a msg. Mailing appt letter and calendar printout

## 2019-08-20 ENCOUNTER — Other Ambulatory Visit: Payer: Self-pay

## 2019-08-20 ENCOUNTER — Ambulatory Visit
Admission: RE | Admit: 2019-08-20 | Discharge: 2019-08-20 | Disposition: A | Discharge status: Home / Self Care | Payer: Managed Care, Other (non HMO) | Source: Ambulatory Visit | Attending: Radiation Oncology | Admitting: Radiation Oncology

## 2019-08-20 DIAGNOSIS — C50511 Malignant neoplasm of lower-outer quadrant of right female breast: Secondary | ICD-10-CM | POA: Diagnosis not present

## 2019-08-21 ENCOUNTER — Ambulatory Visit
Admission: RE | Admit: 2019-08-21 | Discharge: 2019-08-21 | Disposition: A | Discharge status: Home / Self Care | Payer: Managed Care, Other (non HMO) | Source: Ambulatory Visit | Attending: Radiation Oncology | Admitting: Radiation Oncology

## 2019-08-21 ENCOUNTER — Ambulatory Visit: Payer: Managed Care, Other (non HMO) | Admitting: Radiation Oncology

## 2019-08-21 ENCOUNTER — Other Ambulatory Visit: Payer: Self-pay

## 2019-08-21 DIAGNOSIS — C50511 Malignant neoplasm of lower-outer quadrant of right female breast: Secondary | ICD-10-CM

## 2019-08-21 MED ORDER — SONAFINE EX EMUL
1.0000 "application " | Freq: Once | CUTANEOUS | Status: AC
  Administered 2019-08-21: 1 via TOPICAL

## 2019-08-24 ENCOUNTER — Other Ambulatory Visit: Payer: Self-pay

## 2019-08-24 ENCOUNTER — Ambulatory Visit
Admission: RE | Admit: 2019-08-24 | Discharge: 2019-08-24 | Disposition: A | Discharge status: Home / Self Care | Payer: Managed Care, Other (non HMO) | Source: Ambulatory Visit | Attending: Radiation Oncology | Admitting: Radiation Oncology

## 2019-08-24 DIAGNOSIS — C50511 Malignant neoplasm of lower-outer quadrant of right female breast: Secondary | ICD-10-CM | POA: Diagnosis not present

## 2019-08-25 ENCOUNTER — Ambulatory Visit
Admission: RE | Admit: 2019-08-25 | Discharge: 2019-08-25 | Disposition: A | Discharge status: Home / Self Care | Payer: Managed Care, Other (non HMO) | Source: Ambulatory Visit | Attending: Radiation Oncology | Admitting: Radiation Oncology

## 2019-08-25 ENCOUNTER — Other Ambulatory Visit: Payer: Self-pay

## 2019-08-25 DIAGNOSIS — C50511 Malignant neoplasm of lower-outer quadrant of right female breast: Secondary | ICD-10-CM | POA: Diagnosis not present

## 2019-08-26 ENCOUNTER — Other Ambulatory Visit: Payer: Self-pay

## 2019-08-26 ENCOUNTER — Ambulatory Visit
Admission: RE | Admit: 2019-08-26 | Discharge: 2019-08-26 | Disposition: A | Discharge status: Home / Self Care | Payer: Managed Care, Other (non HMO) | Source: Ambulatory Visit | Attending: Radiation Oncology | Admitting: Radiation Oncology

## 2019-08-26 DIAGNOSIS — C50511 Malignant neoplasm of lower-outer quadrant of right female breast: Secondary | ICD-10-CM | POA: Diagnosis not present

## 2019-08-27 ENCOUNTER — Ambulatory Visit
Admission: RE | Admit: 2019-08-27 | Discharge: 2019-08-27 | Disposition: A | Discharge status: Home / Self Care | Payer: Managed Care, Other (non HMO) | Source: Ambulatory Visit | Attending: Radiation Oncology | Admitting: Radiation Oncology

## 2019-08-27 ENCOUNTER — Other Ambulatory Visit: Payer: Self-pay

## 2019-08-27 DIAGNOSIS — Z510 Encounter for antineoplastic radiation therapy: Secondary | ICD-10-CM | POA: Diagnosis not present

## 2019-08-27 DIAGNOSIS — C50511 Malignant neoplasm of lower-outer quadrant of right female breast: Secondary | ICD-10-CM | POA: Diagnosis present

## 2019-08-28 ENCOUNTER — Encounter: Payer: Self-pay | Admitting: Radiation Oncology

## 2019-08-28 ENCOUNTER — Ambulatory Visit
Admission: RE | Admit: 2019-08-28 | Discharge: 2019-08-28 | Disposition: A | Discharge status: Home / Self Care | Payer: Managed Care, Other (non HMO) | Source: Ambulatory Visit | Attending: Radiation Oncology | Admitting: Radiation Oncology

## 2019-08-28 ENCOUNTER — Other Ambulatory Visit: Payer: Self-pay

## 2019-08-28 DIAGNOSIS — Z510 Encounter for antineoplastic radiation therapy: Secondary | ICD-10-CM | POA: Diagnosis not present

## 2019-09-01 ENCOUNTER — Encounter: Payer: Self-pay | Admitting: *Deleted

## 2019-09-24 ENCOUNTER — Telehealth: Payer: Self-pay | Admitting: Radiation Oncology

## 2019-09-24 NOTE — Telephone Encounter (Addendum)
  Radiation Oncology         [phone]) [phone] ________________________________  Name: Terri Hoover MRN: [id number]  Date of Service: 09/24/2019  DOB: 02-Aug-1975  Post Treatment Telephone Note  Diagnosis:  Stage IA, pT1bN0M0 grade 1, ER/PR positive invasive ductal carcinoma of the right breast  Interval Since Last Radiation:  4 weeks   07/14/19-08/28/19:  The right breast was treated to 50.4 Gy in 28 fractions followed by a 10 Gy boost in 5 fractions  Narrative:  The patient was contacted today for routine follow-up. During treatment she did very well with radiotherapy and did not have significant desquamation.  Impression/Plan: 1. Stage IA, pT1bN0M0 grade 1, ER/PR positive invasive ductal carcinoma of the right breast. I was unable to reach the patient and her voicemail is not set up.  We would be happy to continue to follow her as needed, and I'll ask nursing to follow up with her, but she will also continue to follow up with Dr. Lindi Adie in medical oncology.    Carola Rhine, PAC

## 2019-09-25 ENCOUNTER — Telehealth: Payer: Self-pay

## 2019-09-25 NOTE — Telephone Encounter (Signed)
Called to reach out to patient, per Shona Simpson PA. Per Bryson Ha check on patient and ask if there are any issues after radiation treatment. Review skin and advised to apply sunscreen and keep post treatment area covered when going outdoors. Was unable to leave a voicemail message to call back on patient's contact number. TM

## 2019-10-02 NOTE — Progress Notes (Signed)
  Radiation Oncology         [phone] ________________________________  Name: Terri Hoover MRN: [id number]  Date: 08/28/2019  DOB: 07/26/1975  End of Treatment Note  Diagnosis:   right-sided breast cancer     Indication for treatment:  Curative       Radiation treatment dates:   07/14/19 - 08/28/19  Site/dose:   The patient initially received a dose of 50.4 Gy in 28 fractions to the breast using whole-breast tangent fields. This was delivered using a 3-D conformal technique. The patient then received a boost to the seroma. This delivered an additional 10 Gy in 5 fractions using a 3-field photon boost technique. The total dose was 60.4 Gy.  Narrative: The patient tolerated radiation treatment relatively well.   The patient had some expected skin irritation as she progressed during treatment. Moist desquamation was not present at the end of treatment.  Plan: The patient has completed radiation treatment. The patient will return to radiation oncology clinic for routine followup in one month. I advised the patient to call or return sooner if they have any questions or concerns related to their recovery or treatment. ________________________________  Jodelle Gross, M.D., Ph.D.

## 2019-11-11 ENCOUNTER — Encounter: Payer: Self-pay | Admitting: Obstetrics and Gynecology

## 2019-11-11 ENCOUNTER — Ambulatory Visit (INDEPENDENT_AMBULATORY_CARE_PROVIDER_SITE_OTHER): Payer: Managed Care, Other (non HMO) | Admitting: Obstetrics and Gynecology

## 2019-11-11 ENCOUNTER — Other Ambulatory Visit: Payer: Self-pay

## 2019-11-11 VITALS — BP 118/72

## 2019-11-11 DIAGNOSIS — N939 Abnormal uterine and vaginal bleeding, unspecified: Secondary | ICD-10-CM | POA: Diagnosis not present

## 2019-11-11 DIAGNOSIS — B9689 Other specified bacterial agents as the cause of diseases classified elsewhere: Secondary | ICD-10-CM

## 2019-11-11 DIAGNOSIS — R21 Rash and other nonspecific skin eruption: Secondary | ICD-10-CM

## 2019-11-11 DIAGNOSIS — N898 Other specified noninflammatory disorders of vagina: Secondary | ICD-10-CM

## 2019-11-11 DIAGNOSIS — N760 Acute vaginitis: Secondary | ICD-10-CM

## 2019-11-11 DIAGNOSIS — R300 Dysuria: Secondary | ICD-10-CM | POA: Diagnosis not present

## 2019-11-11 LAB — WET PREP FOR TRICH, YEAST, CLUE

## 2019-11-11 MED ORDER — METRONIDAZOLE 500 MG PO TABS
500.0000 mg | ORAL_TABLET | Freq: Two times a day (BID) | ORAL | 0 refills | Status: DC
Start: 2019-11-11 — End: 2019-11-18

## 2019-11-11 NOTE — Progress Notes (Signed)
Terri Hoover 1975-07-11 [id number]  SUBJECTIVE:  44 y.o. [phone] female presents for evaluation of vaginal/rectal irritation and a burning sensation, minimal vaginal discharge, no vaginal bleeding.  She has been amenorrheic since having treatment for breast cancer which she did complete in November.  She has decided not to take tamoxifen and will be discussing that with her oncologist soon.  She has a history of thrombosed hemorrhoids no rectal bleeding currently but was concerned about that possibility.  She does admit to being at the beach recently and having a wet bathing suit on for extended period of time and after physical activity will sometimes not change into dry clothes right away.  Mild burning with urination but no frequency change.  Current Outpatient Medications  Medication Sig Dispense Refill  . FLUoxetine (PROZAC) 40 MG capsule Take 1 capsule (40 mg total) by mouth daily. 90 capsule 3  . levothyroxine (SYNTHROID) 75 MCG tablet Take 75 mcg by mouth daily before breakfast.    . liothyronine (CYTOMEL) 5 MCG tablet Take 5 mcg by mouth daily.    . Magnesium 500 MG TABS Take by mouth.    . Multiple Vitamin (MULTIVITAMIN) tablet Take 1 tablet by mouth daily.    . tamoxifen (NOLVADEX) 20 MG tablet Take 1 tablet (20 mg total) by mouth daily. (Patient not taking: Reported on 11/11/2019) 90 tablet 3  . triamcinolone cream (KENALOG) 0.5 % Apply 1 application topically 3 (three) times daily. (Patient not taking: Reported on 11/11/2019) 60 g 2  . trimethoprim-polymyxin b (POLYTRIM) ophthalmic solution 1 drop every 3 (three) hours. (Patient not taking: Reported on 11/11/2019)    . venlafaxine XR (EFFEXOR-XR) 37.5 MG 24 hr capsule Take 1 capsule (37.5 mg total) by mouth daily with breakfast. (Patient not taking: Reported on 11/11/2019) 30 capsule 6   No current facility-administered medications for this visit.   Allergies: Patient has no known allergies.  No LMP recorded. (Menstrual status:  Other).  Past medical history,surgical history, problem list, medications, allergies, family history and social history were all reviewed and documented as reviewed in the EPIC chart.  ROS: Positives and negatives as reviewed in HPI   OBJECTIVE:  BP 118/72  The patient appears well, alert, oriented, in no distress. PELVIC EXAM: VULVA: normal appearing vulva with no masses, tenderness or lesions, perineum with slightly tender mildly inflamed/thematous skin lesion <1 cm size at the right aspect of the lateral perineum with slight split in the skin but no active drainage, VAGINA: normal appearing vagina with thick grayish golden color discharge, no lesions, CERVIX: normal appearing cervix with no lesions, small amount of bright red blood at external os, no active bleeding, RECTAL: without evidence of external hemorrhoid, WET MOUNT done - results: +clue cells, no yeast or trichomonas, few WBC, many bacteria, normal epithelial cells Urinalysis with 10-20 WBC, 0-2 RBC, 6-10 epithelial cells, moderate bacteria, cloudy, 2+ leukocyte esterase, negative nitrite  Chaperone: Aurora Mask (DNP student) present during the examination and performed the pelvic exam with me in attendance to confirm the exam findings    ASSESSMENT:  44 y.o. [id number] with bacterial vaginosis and perineal inflammation, scant vaginal bleeding  PLAN:  1.  We will treat bacterial vaginosis with metronidazole 500 mg twice daily for 7 days.  Reviewed possible side effects avoidance of alcohol while taking the medication. 2.  We will await on urine culture results before deciding if she needs treatment for potential UTI. 3.  Perineal inflammation.  Likely a consequence of wet undergarments/swimsuit.  Recommend keeping the area dry and changing out of wet clothing as soon as possible.  She can apply petroleum jelly and/or Neosporin ointment to the slightly inflamed area for a moisture barrier while it is healing.  Also could apply some  over-the-counter hydrocortisone 1% as needed.  If not improving then she should follow-up. 4.  The patient was unaware of having a period.  She was noted to have a little bit of blood at the external os on exam today.  Uncertain if this is representative of inflammation from the [place] or if she might be starting menses again especially with some tamoxifen use.  I told her to keep an eye on the bleeding if there is anything persistent that develops and I want her to return for an evaluation.   Joseph Pierini MD 11/11/19

## 2019-11-13 LAB — URINALYSIS, COMPLETE W/RFL CULTURE
Bilirubin Urine: NEGATIVE
Glucose, UA: NEGATIVE
Hyaline Cast: NONE SEEN /LPF
Ketones, ur: NEGATIVE
Nitrites, Initial: NEGATIVE
Protein, ur: NEGATIVE
Specific Gravity, Urine: 1.02 (ref 1.001–1.03)
pH: 5.5 (ref 5.0–8.0)

## 2019-11-13 LAB — URINE CULTURE
MICRO NUMBER:: 10953674
Result:: NO GROWTH
SPECIMEN QUALITY:: ADEQUATE

## 2019-11-13 LAB — CULTURE INDICATED

## 2019-11-17 NOTE — Progress Notes (Signed)
SURVIVORSHIP VISIT:    BRIEF ONCOLOGIC HISTORY:  Oncology History  Malignant neoplasm of lower-outer quadrant of right breast of female, estrogen receptor positive ([place])  01/15/2019 Initial Diagnosis   Screening mammogram detected an area of distortion in the lower central right breast with a possible associated mass. 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 Lumpectomy showed IDC.    02/10/2019 Surgery   Right lumpectomy [name]) ([name]): IDC, grade 1, 0.8cm, involved lateral margin, HER-2 equivocal (2+) by IHC, negative by FISH, ER+ 50%, PR+ 50%, Ki67 10%. No regional lymph nodes were examined.   02/10/2019 Cancer Staging   Staging form: Breast, AJCC 8th Edition - Pathologic stage from 02/10/2019: Stage IA (pT1b, pN0, cM0, G1, ER+, PR+, HER2-, Oncotype DX score: 24)   03/04/2019 Oncotype testing   The Oncotype DX score was 24 predicting a risk of outside the breast recurrence over the next 9 years of 10% if the patient's only systemic therapy is tamoxifen for 5 years.     03/09/2019 Genetic Testing   Negative genetic testing:  No pathogenic variants detected on the [facility] Breast Cancer STAT panel or the Common Hereditary Cancers panel. The report date is 03/09/19.  The Breast Cancer STAT panel offered by [facility] includes sequencing and rearrangement analysis for the following 9 genes:  ATM, BRCA1, BRCA2, CDH1, CHEK2, PALB2, PTEN, STK11 and TP53.  The Common Hereditary Cancers Panel offered by [facility] includes sequencing and/or deletion duplication testing of the following 48 genes: APC, ATM, AXIN2, BARD1, BMPR1A, BRCA1, BRCA2, BRIP1, CDH1, CDK4, CDKN2A (p14ARF), CDKN2A (p16INK4a), CHEK2, CTNNA1, DICER1, EPCAM (Deletion/duplication testing only), GREM1 (promoter region deletion/duplication testing only), KIT, MEN1, MLH1, MSH2, MSH3, MSH6, MUTYH, NBN, NF1, NHTL1, PALB2, PDGFRA, PMS2, POLD1, POLE, PTEN, RAD50, RAD51C, RAD51D, RNF43, SDHB, SDHC, SDHD, SMAD4, SMARCA4.  STK11, TP53, TSC1, TSC2, and VHL.  The following genes were evaluated for sequence changes only: SDHA and HOXB13 c.251G>A variant only.    03/17/2019 Surgery   Right lumpectomy [name]) [phone]): no residual carcinoma and 5 right axillary lymph nodes negative   04/14/2019 - 06/16/2019 Chemotherapy   dexamethasone (DECADRON) 4 MG tablet, 4 mg (100 % of original dose 4 mg), Oral, 2 times daily, 1 of 1 cycle, Start date: 03/10/2019, End date: 07/22/2019. Dose modification: 4 mg (original dose 4 mg, Cycle 0)  palonosetron (ALOXI) injection 0.25 mg, 0.25 mg, Intravenous,  Once, 4 of 4 cycles. Administration: 0.25 mg (04/14/2019), 0.25 mg (05/05/2019), 0.25 mg (05/26/2019), 0.25 mg (06/16/2019)  cyclophosphamide (CYTOXAN) 1,040 mg in sodium chloride 0.9 % 250 mL chemo infusion, 600 mg/m2 = 1,040 mg, Intravenous,  Once, 4 of 4 cycles. Dose modification: 500 mg/m2 (original dose 600 mg/m2, Cycle 2, Reason: Provider Judgment). Administration: 1,040 mg (04/14/2019), 880 mg (05/05/2019), 880 mg (05/26/2019), 880 mg (06/16/2019)  DOCEtaxel (TAXOTERE) 130 mg in sodium chloride 0.9 % 250 mL chemo infusion, 75 mg/m2 = 130 mg, Intravenous,  Once, 4 of 4 cycles. Dose modification: 70 mg/m2 (original dose 75 mg/m2, Cycle 2, Reason: Provider Judgment). Administration: 130 mg (04/14/2019), 120 mg (05/05/2019), 120 mg (05/26/2019), 120 mg (06/16/2019)   07/14/2019 - 08/28/2019 Radiation Therapy   The patient initially received a dose of 50.4 Gy in 28 fractions to the breast using whole-breast tangent fields. This was delivered using a 3-D conformal technique. The patient then received a boost to the seroma. This delivered an additional 10 Gy in 5 fractions using a 3-field photon boost technique. The total dose was 60.4 Gy.   07/2019 - 07/2029  Anti-estrogen oral therapy   Tamoxifen     INTERVAL HISTORY:  Ms. Defrank to review her survivorship care plan detailing her treatment course for breast cancer, as well as monitoring  long-term side effects of that treatment, education regarding health maintenance, screening, and overall wellness and health promotion.     Overall, Ms. Wiltsey reports feeling quite well. She is taking Tamoxifen daily and is tolerating it quite well.  Her only issue is related to snoring and she thinks this is related to a deviated septum.    REVIEW OF SYSTEMS:  Review of Systems  Constitutional: Negative for appetite change, chills, fatigue, fever and unexpected weight change.  HENT:   Negative for hearing loss and trouble swallowing.   Eyes: Negative for eye problems and icterus.  Respiratory: Negative for chest tightness, cough and shortness of breath.   Cardiovascular: Negative for chest pain, leg swelling and palpitations.  Gastrointestinal: Negative for abdominal distention, abdominal pain, constipation, diarrhea, nausea and vomiting.  Endocrine: Negative for hot flashes.  Genitourinary: Negative for difficulty urinating.   Musculoskeletal: Negative for arthralgias.  Skin: Negative for rash.  Neurological: Negative for dizziness, extremity weakness, headaches and numbness.  Hematological: Negative for adenopathy. Does not bruise/bleed easily.  Psychiatric/Behavioral: Negative for depression. The patient is not nervous/anxious.   Breast: Denies any new nodularity, masses, tenderness, nipple changes, or nipple discharge.    ONCOLOGY TREATMENT TEAM:  1. Surgeon:  Dr. [name] at [facility] 2. Medical Oncologist: Dr. Lindi Adie  3. Radiation Oncologist: Dr. Lisbeth Renshaw    PAST MEDICAL/SURGICAL HISTORY:  Past Medical History:  Diagnosis Date   Anxiety    ASCUS of cervix with negative high risk HPV 09/2018   Family history of breast cancer    Family history of esophageal cancer    Family history of lung cancer    Family history of melanoma    Family history of ovarian cancer    Family history of thyroid cancer    Frequency of urination    Hashimoto's disease     History of TMJ disorder    Hypothyroidism    Past Surgical History:  Procedure Laterality Date   BREAST BIOPSY Right 2016   benign   BREAST BIOPSY Right 2019   BREAST LUMPECTOMY WITH SENTINEL LYMPH NODE BIOPSY Right 03/17/2019   Procedure: RE EXCISION OF RIGHT BREAST LUMPECTOMY WITH RIGHT AXILLARY SENTINEL LYMPH NODE BIOPSY AND BLUE DYE INJECTION;  Surgeon: Rolm Bookbinder, MD;  Location: [place];  Service: General;  Laterality: Right;   COLPOSCOPY  2000   CYSTO WITH HYDRODISTENSION N/A 05/06/2013   Procedure: CYSTOSCOPY/HYDRODISTENSION WITH TRIGGER POINT INJECTION ;  Surgeon: Alexis Frock, MD;  Location: [facility];  Service: Urology;  Laterality: N/A;   DILATION AND CURETTAGE OF UTERUS  09-04-2002   W/  SUCTION FOR POSTPARTUM RETAINED PRODUCTS   DX LAPAROSCOPY /  LASER ABLATION ENDOMETRIOSIS  2002   PORTACATH PLACEMENT Right 03/17/2019   Procedure: INSERTION PORT-A-CATH WITH ULTRASOUND;  Surgeon: Rolm Bookbinder, MD;  Location: [place];  Service: General;  Laterality: Right;   RADIOACTIVE SEED GUIDED EXCISIONAL BREAST BIOPSY Right 02/10/2019   Procedure: RADIOACTIVE SEED GUIDED EXCISIONAL RIGHT BREAST BIOPSY;  Surgeon: Rolm Bookbinder, MD;  Location: [place];  Service: General;  Laterality: Right;   TEMPOROMANDIBULAR JOINT SURGERY  2000     ALLERGIES:  No Known Allergies   CURRENT MEDICATIONS:  Outpatient Encounter Medications as of 11/18/2019  Medication Sig   FLUoxetine (PROZAC) 40  MG capsule Take 1 capsule (40 mg total) by mouth daily.   levothyroxine (SYNTHROID) 75 MCG tablet Take 75 mcg by mouth daily before breakfast.   liothyronine (CYTOMEL) 5 MCG tablet Take 5 mcg by mouth daily.   Magnesium 500 MG TABS Take by mouth.   metroNIDAZOLE (FLAGYL) 500 MG tablet Take 1 tablet (500 mg total) by mouth 2 (two) times daily.   Multiple Vitamin (MULTIVITAMIN) tablet Take 1 tablet by  mouth daily.   tamoxifen (NOLVADEX) 20 MG tablet Take 1 tablet (20 mg total) by mouth daily.   triamcinolone cream (KENALOG) 0.5 % Apply 1 application topically 3 (three) times daily.   trimethoprim-polymyxin b (POLYTRIM) ophthalmic solution 1 drop every 3 (three) hours.    venlafaxine XR (EFFEXOR-XR) 37.5 MG 24 hr capsule Take 1 capsule (37.5 mg total) by mouth daily with breakfast.   No facility-administered encounter medications on file as of 11/18/2019.     ONCOLOGIC FAMILY HISTORY:  Family History  Problem Relation Age of Onset   Heart disease Father    Breast cancer Maternal Aunt 35   Breast cancer Maternal Grandmother 85       recurred at age 34   Esophageal cancer Paternal Uncle 65   Thyroid cancer Maternal Grandfather        dx. in his 75s   Melanoma Paternal Grandmother        dx. in her 62s   Lung cancer Paternal Grandfather        dx. in his 42s   Bladder Cancer Paternal Grandfather 81   Breast cancer Other 39       maternal grandmother's sister   Breast cancer Other 28       paternal 4th degree relative   Liver cancer Other 74   Ovarian cancer Other 51       paternal 5th degree relative     GENETIC COUNSELING/TESTING: See above  SOCIAL HISTORY:  Social History   Socioeconomic History   Marital status: Married    Spouse name: Not on file   Number of children: Not on file   Years of education: Not on file   Highest education level: Not on file  Occupational History   Not on file  Tobacco Use   Smoking status: Never Smoker   Smokeless tobacco: Never Used  Vaping Use   Vaping Use: Never used  Substance and Sexual Activity   Alcohol use: Yes    Alcohol/week: 0.0 standard drinks    Comment: OCCASIONALLY   Drug use: No   Sexual activity: Yes    Birth control/protection: None  Other Topics Concern   Not on file  Social History Narrative   Not on file   Social Determinants of Health   Financial Resource Strain:     Difficulty of Paying Living Expenses: Not on file  Food Insecurity:    Worried About Charity fundraiser in the Last Year: Not on file   [employer] of Food in the Last Year: Not on file  Transportation Needs:    Lack of Transportation (Medical): Not on file   Lack of Transportation (Non-Medical): Not on file  Physical Activity:    Days of Exercise per Week: Not on file   Minutes of Exercise per Session: Not on file  Stress:    Feeling of Stress : Not on file  Social Connections:    Frequency of Communication with Friends and Family: Not on file   Frequency of Social Gatherings with Friends  and Family: Not on file   Attends Religious Services: Not on file   Active Member of Clubs or Organizations: Not on file   Attends Archivist Meetings: Not on file   Marital Status: Not on file  Intimate Partner Violence:    Fear of Current or Ex-Partner: Not on file   Emotionally Abused: Not on file   Physically Abused: Not on file   Sexually Abused: Not on file     OBSERVATIONS/OBJECTIVE:  BP 118/81 (BP Location: Left Arm, Patient Position: Sitting)    Pulse 68    Temp (!) 97.4 F (36.3 C) (Tympanic)    Resp 17    Ht 5' 5.5" (1.664 m)    Wt 143 lb (64.9 kg)    SpO2 100%    BMI 23.43 kg/m  GENERAL: Patient is a well appearing female in no acute distress HEENT:  Sclerae anicteric.  Mask in place. Neck is supple.  NODES:  No cervical, supraclavicular, or axillary lymphadenopathy palpated.  BREAST EXAM:  right breast s/p lumpectomy and radiation, no sign of local recurrence, left breast benign LUNGS:  Clear to auscultation bilaterally.  No wheezes or rhonchi. HEART:  Regular rate and rhythm. No murmur appreciated. ABDOMEN:  Soft, nontender.  Positive, normoactive bowel sounds. No organomegaly palpated. MSK:  No focal spinal tenderness to palpation. Full range of motion bilaterally in the upper extremities. EXTREMITIES:  No peripheral edema.   SKIN:  Clear with no  obvious rashes or skin changes. No nail dyscrasia. NEURO:  Nonfocal. Well oriented.  Appropriate affect.    LABORATORY DATA:  None for this visit.  DIAGNOSTIC IMAGING:  None for this visit.      ASSESSMENT AND PLAN:  Ms.. Donnelly is a pleasant 44 y.o. female with Stage IA right breast invasive ductal carcinoma, ER+/PR+/HER2-, diagnosed in 12/2018, treated with lumpectomy, adjuvant chemotherapy, adjuvant radiation therapy, and anti-estrogen therapy with Tamoxifen beginning in 07/2019.  She presents to the Survivorship Clinic for our initial meeting and routine follow-up post-completion of treatment for breast cancer.    1. Stage IA right breast cancer:  Ms. Wiesman is continuing to recover from definitive treatment for breast cancer. She will follow-up with her medical oncologist, Dr. Lindi Adie with history and physical exam per surveillance protocol.  She will continue her anti-estrogen therapy with Tamoxifen. Thus far, she is tolerating the Tamxofien well, with minimal side effects. She was instructed to make Dr. Lindi Adie or myself aware if she begins to experience any worsening side effects of the medication and I could see her back in clinic to help manage those side effects, as needed. Her mammogram is due 02/2020; orders placed today. MRI in 08/2020, orders placed today.    Today, a comprehensive survivorship care plan and treatment summary was reviewed with the patient today detailing her breast cancer diagnosis, treatment course, potential late/long-term effects of treatment, appropriate follow-up care with recommendations for the future, and patient education resources.  A copy of this summary, along with a letter will be sent to the patients primary care provider via mail/fax/In Basket message after todays visit.    2. Bone health:   She was given education on specific activities to promote bone health.  3. Cancer screening:  Due to Ms. Steele's history and her age, she should receive  screening for skin cancers, colon cancer, and gynecologic cancers.  The information and recommendations are listed on the patient's comprehensive care plan/treatment summary and were reviewed in detail with the patient.  4. Health maintenance and wellness promotion: Ms. Elbe was encouraged to consume 5-7 servings of fruits and vegetables per day. We reviewed the "Nutrition Rainbow" handout, as well as the handout "Take Control of Your Health and Reduce Your Cancer Risk" from the [employer].  She was also encouraged to engage in moderate to vigorous exercise for 30 minutes per day most days of the week. We discussed the [employer] fitness program, which is designed for cancer survivors to help them become more physically fit after cancer treatments.  She was instructed to limit her alcohol consumption and continue to abstain from tobacco use.     5. Support services/counseling: It is not uncommon for this period of the patient's cancer care trajectory to be one of many emotions and stressors.  We discussed how this can be increasingly difficult during the times of quarantine and social distancing due to the COVID-19 pandemic.   She was given information regarding our available services and encouraged to contact me with any questions or for help enrolling in any of our support group/programs.    Follow up instructions:    -Return to cancer center for f/u with Dr. Lindi Adie in 02/2020 -Mammogram due in 02/2020 -MRI in 08/2020 -Follow up with Dr. [name] in 08/2020 -She is welcome to return back to the Survivorship Clinic at any time; no additional follow-up needed at this time.  -Consider referral back to survivorship as a long-term survivor for continued surveillance  The patient was provided an opportunity to ask questions and all were answered. The patient agreed with the plan and demonstrated an understanding of the instructions.   Total encounter time: 30 minutes*  [name], NP 11/18/19 9:57 AM Medical Oncology and Hematology [facility] [place], [place] [address] Tel. [phone]    Fax. [phone]  *Total Encounter Time as defined by the Centers for Medicare and Medicaid Services includes, in addition to the face-to-face time of a patient visit (documented in the note above) non-face-to-face time: obtaining and reviewing outside history, ordering and reviewing medications, tests or procedures, care coordination (communications with other health care professionals or caregivers) and documentation in the medical record.

## 2019-11-18 ENCOUNTER — Inpatient Hospital Stay: Payer: Managed Care, Other (non HMO) | Attending: Hematology and Oncology | Admitting: Adult Health

## 2019-11-18 ENCOUNTER — Other Ambulatory Visit: Payer: Self-pay

## 2019-11-18 ENCOUNTER — Encounter: Payer: Self-pay | Admitting: Adult Health

## 2019-11-18 VITALS — BP 118/81 | HR 68 | Temp 97.4°F | Resp 17 | Ht 65.5 in | Wt 143.0 lb

## 2019-11-18 DIAGNOSIS — Z9221 Personal history of antineoplastic chemotherapy: Secondary | ICD-10-CM | POA: Insufficient documentation

## 2019-11-18 DIAGNOSIS — Z923 Personal history of irradiation: Secondary | ICD-10-CM | POA: Insufficient documentation

## 2019-11-18 DIAGNOSIS — Z79810 Long term (current) use of selective estrogen receptor modulators (SERMs): Secondary | ICD-10-CM | POA: Insufficient documentation

## 2019-11-18 DIAGNOSIS — Z808 Family history of malignant neoplasm of other organs or systems: Secondary | ICD-10-CM | POA: Diagnosis not present

## 2019-11-18 DIAGNOSIS — C50511 Malignant neoplasm of lower-outer quadrant of right female breast: Secondary | ICD-10-CM | POA: Insufficient documentation

## 2019-11-18 DIAGNOSIS — Z79899 Other long term (current) drug therapy: Secondary | ICD-10-CM | POA: Insufficient documentation

## 2019-11-18 DIAGNOSIS — Z170 Estrogen receptor positive status [ER+]: Secondary | ICD-10-CM | POA: Insufficient documentation

## 2019-11-18 DIAGNOSIS — Z8052 Family history of malignant neoplasm of bladder: Secondary | ICD-10-CM | POA: Insufficient documentation

## 2019-11-18 DIAGNOSIS — Z801 Family history of malignant neoplasm of trachea, bronchus and lung: Secondary | ICD-10-CM | POA: Insufficient documentation

## 2019-11-18 DIAGNOSIS — Z803 Family history of malignant neoplasm of breast: Secondary | ICD-10-CM | POA: Insufficient documentation

## 2019-11-18 DIAGNOSIS — Z8041 Family history of malignant neoplasm of ovary: Secondary | ICD-10-CM | POA: Diagnosis not present

## 2019-11-18 DIAGNOSIS — Z800 Family history of malignant neoplasm of digestive organs: Secondary | ICD-10-CM | POA: Insufficient documentation

## 2019-11-19 ENCOUNTER — Telehealth: Payer: Self-pay | Admitting: Hematology and Oncology

## 2019-11-19 NOTE — Telephone Encounter (Signed)
Scheduled appts per 9/22 los. Left voicemail with appt date and time.

## 2020-03-07 ENCOUNTER — Ambulatory Visit
Admission: RE | Admit: 2020-03-07 | Discharge: 2020-03-07 | Disposition: A | Discharge status: Home / Self Care | Payer: Managed Care, Other (non HMO) | Source: Ambulatory Visit | Attending: Adult Health | Admitting: Adult Health

## 2020-03-07 ENCOUNTER — Other Ambulatory Visit: Payer: Self-pay

## 2020-03-07 DIAGNOSIS — C50511 Malignant neoplasm of lower-outer quadrant of right female breast: Secondary | ICD-10-CM

## 2020-03-22 NOTE — Progress Notes (Signed)
Patient Care Team: Patient, No Pcp Per as PCP - General (General Practice) Nicholas Lose, MD as Consulting Physician (Hematology and Oncology) Rolm Bookbinder, MD as Consulting Physician (General Surgery) Kyung Rudd, MD as Consulting Physician (Radiation Oncology)  DIAGNOSIS:    ICD-10-CM   1. Malignant neoplasm of lower-outer quadrant of right breast of female, estrogen receptor positive ([place])  C50.511 MR BREAST BILATERAL W [place] CAD   Z17.0     SUMMARY OF ONCOLOGIC HISTORY: Oncology History  Malignant neoplasm of lower-outer quadrant of right breast of female, estrogen receptor positive ([place])  01/15/2019 Initial Diagnosis   Screening mammogram detected an area of distortion in the lower central right breast with a possible associated mass. 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 Lumpectomy showed IDC.    02/10/2019 Surgery   Right lumpectomy [name]) ([name]): IDC, grade 1, 0.8cm, involved lateral margin, HER-2 equivocal (2+) by IHC, negative by FISH, ER+ 50%, PR+ 50%, Ki67 10%. No regional lymph nodes were examined.   02/10/2019 Cancer Staging   Staging form: Breast, AJCC 8th Edition - Pathologic stage from 02/10/2019: Stage IA (pT1b, pN0, cM0, G1, ER+, PR+, HER2-, Oncotype DX score: 24)   03/04/2019 Oncotype testing   The Oncotype DX score was 24 predicting a risk of outside the breast recurrence over the next 9 years of 10% if the patient's only systemic therapy is tamoxifen for 5 years.     03/09/2019 Genetic Testing   Negative genetic testing:  No pathogenic variants detected on the [facility] Breast Cancer STAT panel or the Common Hereditary Cancers panel. The report date is 03/09/19.  The Breast Cancer STAT panel offered by [facility] includes sequencing and rearrangement analysis for the following 9 genes:  ATM, BRCA1, BRCA2, CDH1, CHEK2, PALB2, PTEN, STK11 and TP53.  The Common Hereditary Cancers Panel offered by [facility] includes sequencing  and/or deletion duplication testing of the following 48 genes: APC, ATM, AXIN2, BARD1, BMPR1A, BRCA1, BRCA2, BRIP1, CDH1, CDK4, CDKN2A (p14ARF), CDKN2A (p16INK4a), CHEK2, CTNNA1, DICER1, EPCAM (Deletion/duplication testing only), GREM1 (promoter region deletion/duplication testing only), KIT, MEN1, MLH1, MSH2, MSH3, MSH6, MUTYH, NBN, NF1, NHTL1, PALB2, PDGFRA, PMS2, POLD1, POLE, PTEN, RAD50, RAD51C, RAD51D, RNF43, SDHB, SDHC, SDHD, SMAD4, SMARCA4. STK11, TP53, TSC1, TSC2, and VHL.  The following genes were evaluated for sequence changes only: SDHA and HOXB13 c.251G>A variant only.    03/17/2019 Surgery   Right lumpectomy [name]) [phone]): no residual carcinoma and 5 right axillary lymph nodes negative   04/14/2019 - 06/16/2019 Chemotherapy   dexamethasone (DECADRON) 4 MG tablet, 4 mg (100 % of original dose 4 mg), Oral, 2 times daily, 1 of 1 cycle, Start date: 03/10/2019, End date: 07/22/2019. Dose modification: 4 mg (original dose 4 mg, Cycle 0)  palonosetron (ALOXI) injection 0.25 mg, 0.25 mg, Intravenous,  Once, 4 of 4 cycles. Administration: 0.25 mg (04/14/2019), 0.25 mg (05/05/2019), 0.25 mg (05/26/2019), 0.25 mg (06/16/2019)  cyclophosphamide (CYTOXAN) 1,040 mg in sodium chloride 0.9 % 250 mL chemo infusion, 600 mg/m2 = 1,040 mg, Intravenous,  Once, 4 of 4 cycles. Dose modification: 500 mg/m2 (original dose 600 mg/m2, Cycle 2, Reason: Provider Judgment). Administration: 1,040 mg (04/14/2019), 880 mg (05/05/2019), 880 mg (05/26/2019), 880 mg (06/16/2019)  DOCEtaxel (TAXOTERE) 130 mg in sodium chloride 0.9 % 250 mL chemo infusion, 75 mg/m2 = 130 mg, Intravenous,  Once, 4 of 4 cycles. Dose modification: 70 mg/m2 (original dose 75 mg/m2, Cycle 2, Reason: Provider Judgment). Administration: 130 mg (04/14/2019), 120 mg (05/05/2019), 120 mg (05/26/2019),  120 mg (06/16/2019)   07/14/2019 - 08/28/2019 Radiation Therapy   The patient initially received a dose of 50.4 Gy in 28 fractions to the breast using  whole-breast tangent fields. This was delivered using a 3-D conformal technique. The patient then received a boost to the seroma. This delivered an additional 10 Gy in 5 fractions using a 3-field photon boost technique. The total dose was 60.4 Gy.   07/2019 - 09/2019 Anti-estrogen oral therapy   Tamoxifen: Could not tolerate it and stopped after 2 months.     CHIEF COMPLIANT: Follow-up of right breast cancer   INTERVAL HISTORY: Terri Hoover is a 45 y.o. with above-mentioned history of right breast cancer who underwent a rightlumpectomy, adjuvant chemotherapy, radiation, and is currently on antiestrogen therapy with tamoxifen. Mammogram on 03/07/20 showed no evidence of malignancy bilaterally. She presents to the clinic todayfor follow-up.   ALLERGIES:  has No Known Allergies.  MEDICATIONS:  Current Outpatient Medications  Medication Sig Dispense Refill  . FLUoxetine (PROZAC) 40 MG capsule Take 1 capsule (40 mg total) by mouth daily. 90 capsule 3  . levothyroxine (SYNTHROID) 75 MCG tablet Take 75 mcg by mouth daily before breakfast.    . liothyronine (CYTOMEL) 5 MCG tablet Take 5 mcg by mouth daily.    . Magnesium 500 MG TABS Take by mouth.    . Multiple Vitamin (MULTIVITAMIN) tablet Take 1 tablet by mouth daily.    Marland Kitchen triamcinolone cream (KENALOG) 0.5 % Apply 1 application topically 3 (three) times daily. 60 g 2  . trimethoprim-polymyxin b (POLYTRIM) ophthalmic solution 1 drop every 3 (three) hours.      No current facility-administered medications for this visit.    PHYSICAL EXAMINATION: ECOG PERFORMANCE STATUS: 1 - Symptomatic but completely ambulatory  Vitals:   03/23/20 0905  BP: 132/72  Pulse: 69  Resp: 17  Temp: (!) 97.5 F (36.4 C)  SpO2: 100%   Filed Weights   03/23/20 0905  Weight: 144 lb 1.6 oz (65.4 kg)    BREAST: No palpable masses or nodules in either right or left breasts. No palpable axillary supraclavicular or infraclavicular adenopathy no breast  tenderness or nipple discharge. (exam performed in the presence of a chaperone)  LABORATORY DATA:  I have reviewed the data as listed CMP Latest Ref Rng & Units 06/16/2019 05/26/2019 05/05/2019  Glucose 70 - 99 mg/dL 98 78 82  BUN 6 - 20 mg/dL 18 16 15   Creatinine 0.44 - 1.00 mg/dL 0.84 0.79 0.79  Sodium 135 - 145 mmol/L 140 140 138  Potassium 3.5 - 5.1 mmol/L 4.4 4.0 3.6  Chloride 98 - 111 mmol/L 106 107 103  CO2 22 - 32 mmol/L 24 26 28   Calcium 8.9 - 10.3 mg/dL 9.2 9.0 9.1  Total Protein 6.5 - 8.1 g/dL 7.0 6.7 6.8  Total Bilirubin 0.3 - 1.2 mg/dL 0.5 0.6 0.6  Alkaline Phos 38 - 126 U/L 69 59 63  AST 15 - 41 U/L 31 22 17   ALT 0 - 44 U/L 29 23 19     Lab Results  Component Value Date   WBC 7.6 06/16/2019   HGB 13.4 06/16/2019   HCT 41.7 06/16/2019   MCV 90.7 06/16/2019   PLT 152 06/16/2019   NEUTROABS 6.3 06/16/2019    ASSESSMENT & PLAN:  Malignant neoplasm of lower-outer quadrant of right breast of female, estrogen receptor positive ([place]) 01/15/2019:screening mammogram detected area of distortion in the lower central right breast with a possible associated mass. Biopsy on 01/15/19  showed focal PASH and no evidence of malignancy. She underwent a lumpectomy with Dr. 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  Genetics were negative Lumpectomy and sentinel node biopsy showed no residual invasive cancer and 5 SLN negative  Treatment plan: 1.Adjuvant chemotherapy with Taxotere and Cytoxan every 3 weeks x4 cyclesfrom2/16/2021through 06/16/2019 2. Adjuvant radiation 07/15/2019 3. Adjuvant antiestrogen therapy with tamoxifen 20 mg daily stopped August 2021 due to  intolerance -------------------------------------------------------------------------------------------------------------------------------------------------- Patient could not tolerate tamoxifen because of how it made her feel.  She could not exercise she cannot be herself and made a decision to discontinue it.  Breast cancer surveillance: 1. Breast exam 03/23/2020: Benign 2. mammogram 03/07/2020: Breast density category C 3. Breast MRI will be done in June  Return to clinic in 1 year for follow-up      Orders Placed This Encounter  Procedures  . MR BREAST BILATERAL W WO CONTRAST INC CAD    Standing Status:   Future    Standing Expiration Date:   03/23/2021    Order Specific Question:   If indicated for the ordered procedure, I authorize the administration of contrast media per Radiology protocol    Answer:   Yes    Order Specific Question:   What is the patient's sedation requirement?    Answer:   No Sedation    Order Specific Question:   Does the patient have a pacemaker or implanted devices?    Answer:   No    Order Specific Question:   Preferred imaging location?    Answer:   [facility] (table limit-550lbs)    Order Specific Question:   Release to patient    Answer:   Immediate   The patient has a good understanding of the overall plan. she agrees with it. she will call with any problems that may develop before the next visit here.  Total time spent: 20 mins including face to face time and time spent for planning, charting and coordination of care  Nicholas Lose, MD 03/23/2020  I, Cloyde Reams Dorshimer, am acting as scribe for Dr. Nicholas Lose.  I have reviewed the above documentation for accuracy and completeness, and I agree with the above.

## 2020-03-22 NOTE — Assessment & Plan Note (Addendum)
01/15/2019:screening mammogram detected area of distortion in the lower central right breast with a possible associated mass. 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 She underwent a lumpectomy with Dr. 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  Genetics were negative Lumpectomy and sentinel node biopsy showed no residual invasive cancer and 5 SLN negative  Treatment plan: 1.Adjuvant chemotherapy with Taxotere and Cytoxan every 3 weeks x4 cyclesfrom2/16/2021through 06/16/2019 2. Adjuvant radiation 07/15/2019 3. Adjuvant antiestrogen therapy with tamoxifen 20 mg daily stopped August 2021 due to intolerance -------------------------------------------------------------------------------------------------------------------------------------------------- Patient could not tolerate tamoxifen because of how it made her feel.  She could not exercise she cannot be herself and made a decision to discontinue it.  Breast cancer surveillance: 1. Breast exam 03/23/2020: Benign 2. mammogram 03/07/2020: Breast density category C 3. Breast MRI will be done in June  Return to clinic in 1 year for follow-up

## 2020-03-23 ENCOUNTER — Inpatient Hospital Stay: Payer: Managed Care, Other (non HMO) | Attending: Hematology and Oncology | Admitting: Hematology and Oncology

## 2020-03-23 ENCOUNTER — Other Ambulatory Visit: Payer: Self-pay

## 2020-03-23 DIAGNOSIS — Z923 Personal history of irradiation: Secondary | ICD-10-CM | POA: Diagnosis not present

## 2020-03-23 DIAGNOSIS — C50511 Malignant neoplasm of lower-outer quadrant of right female breast: Secondary | ICD-10-CM | POA: Insufficient documentation

## 2020-03-23 DIAGNOSIS — Z79899 Other long term (current) drug therapy: Secondary | ICD-10-CM | POA: Diagnosis not present

## 2020-03-23 DIAGNOSIS — Z170 Estrogen receptor positive status [ER+]: Secondary | ICD-10-CM | POA: Diagnosis not present

## 2020-03-23 DIAGNOSIS — Z79810 Long term (current) use of selective estrogen receptor modulators (SERMs): Secondary | ICD-10-CM | POA: Insufficient documentation

## 2020-03-25 ENCOUNTER — Telehealth: Payer: Self-pay | Admitting: Hematology and Oncology

## 2020-03-25 NOTE — Telephone Encounter (Signed)
Scheduled per 1/26 los. Called pt and left a msg  

## 2020-06-13 ENCOUNTER — Ambulatory Visit
Admission: RE | Admit: 2020-06-13 | Discharge: 2020-06-13 | Disposition: A | Discharge status: Home / Self Care | Payer: Managed Care, Other (non HMO) | Source: Ambulatory Visit | Attending: Hematology and Oncology | Admitting: Hematology and Oncology

## 2020-06-13 DIAGNOSIS — C50511 Malignant neoplasm of lower-outer quadrant of right female breast: Secondary | ICD-10-CM

## 2020-06-13 MED ORDER — GADOBUTROL 1 MMOL/ML IV SOLN
6.0000 mL | Freq: Once | INTRAVENOUS | Status: AC | PRN
  Administered 2020-06-13: 6 mL via INTRAVENOUS

## 2020-06-14 ENCOUNTER — Ambulatory Visit: Payer: Self-pay | Admitting: Adult Health

## 2020-06-21 ENCOUNTER — Other Ambulatory Visit: Payer: Managed Care, Other (non HMO)

## 2020-07-23 IMAGING — MR MR BREAST BILAT WO/W CM
8 of 12 series · 32 of 48 positions shown · IV contrast (7 ml Gadavist)
Comparison: Previous exam(s).

CLINICAL DATA: 43-year-old female with recently diagnosed grade 1
invasive ductal carcinoma of the right breast post surgical excision
of a discordant area of distortion 02/10/2019, with broad
involvement of the lateral margin. History of 2 benign MRI guided
right breast biopsies in 0385 (fibroadenoma and fibrocystic change)
as well as an additional benign MRI guided biopsy of a right breast
mass 10/23/2017 with pathology revealing PASH.

Patient has a strong family history of breast cancer, with a
maternal aunt diagnosed with breast cancer at age 35 and maternal
grandmother diagnosed with breast cancer in her 80s with an overall
lifetime risk of 27%.
LABS:  Not applicable.
EXAM:
BILATERAL BREAST MRI WITH AND WITHOUT CONTRAST
TECHNIQUE: Multiplanar, multisequence MR images of both breasts were obtained
prior to and following the intravenous administration of 7 ml of
Gadavist

[Series 2: t2_tirm_tra ipat (a-p) · axial · 3.0mm · 0.70mm/px · 1 of 60 slices shown]
[im 1/60]
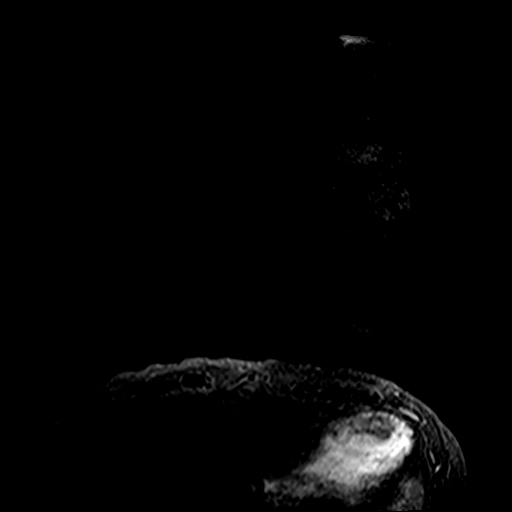

[Series 3: fl3d pre-cm no · axial · non-contrast · 1.2mm · 0.94mm/px · z∈[-109,+82]mm · 5 of 160 slices shown]
[im 1/160]
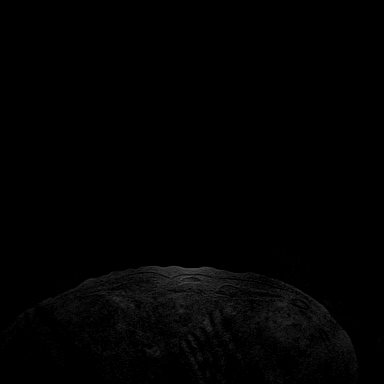
[im 40/160]
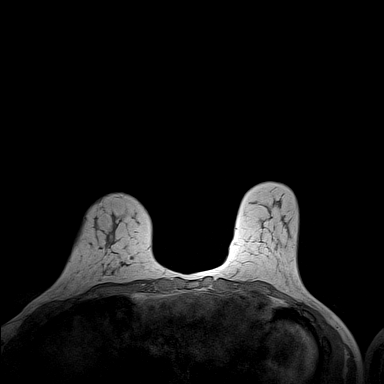
[im 80/160]
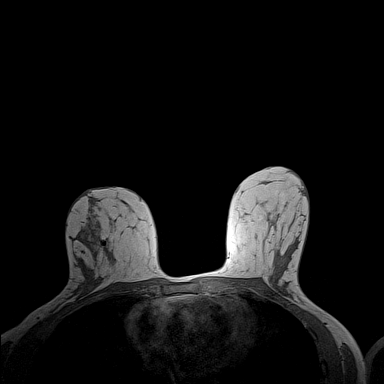
[im 120/160]
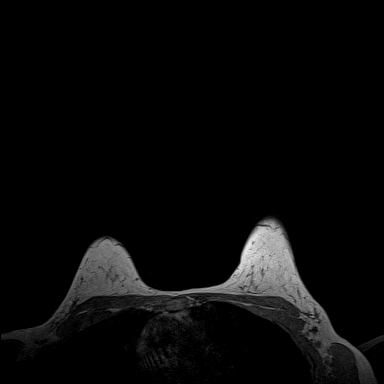
[im 160/160]
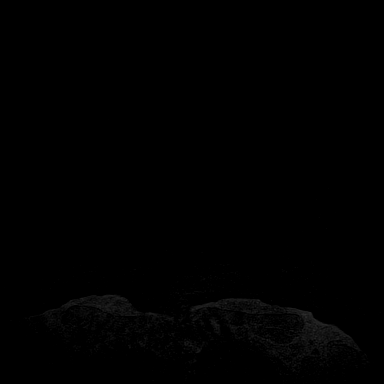

[Series 4: fl3d pre-cm · axial · non-contrast · 1.2mm · 0.94mm/px · z∈[-109,+82]mm · 5 of 160 slices shown]
[im 1/160]
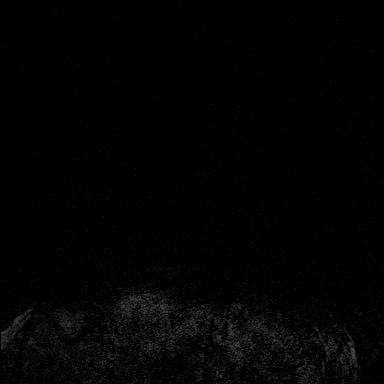
[im 40/160]
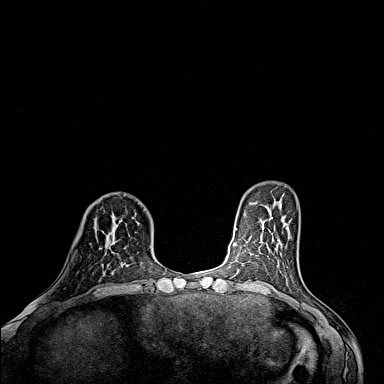
[im 80/160]
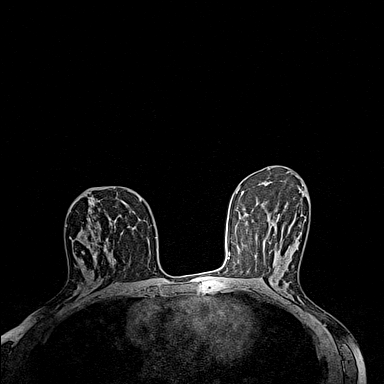
[im 120/160]
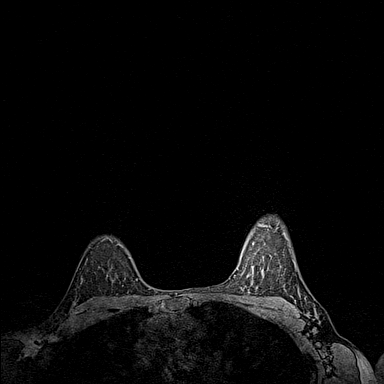
[im 160/160]
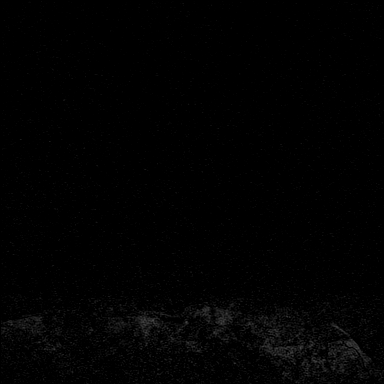

[Series 5: fl3d post-cm 20 · axial · 1.2mm · 0.94mm/px · z∈[-109,+82]mm · 5 of 160 slices shown (1 of 3)]
[im 1/160]
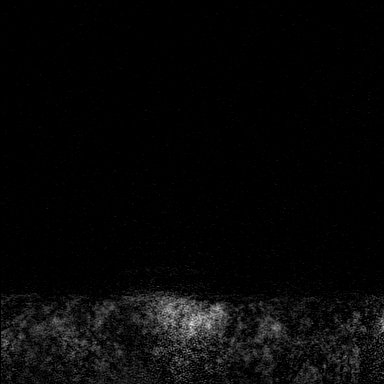
[im 40/160]
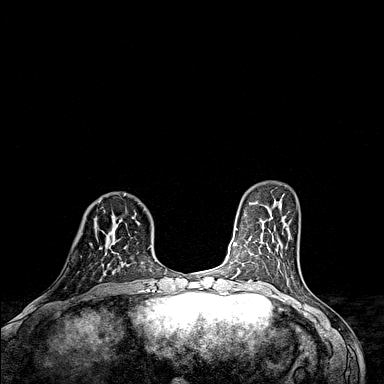
[im 80/160]
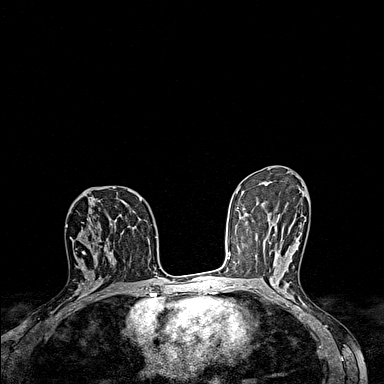
[im 120/160]
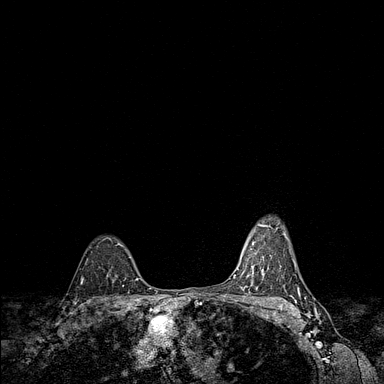
[im 160/160]
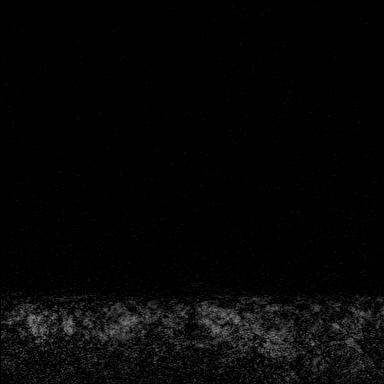

[Series 6: fl3d post-cm 20 · axial · 1.2mm · 0.94mm/px · z∈[-109,+82]mm · 5 of 160 slices shown (2 of 3)]
[im 1/160]
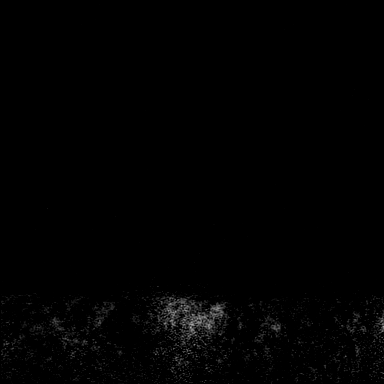
[im 40/160]
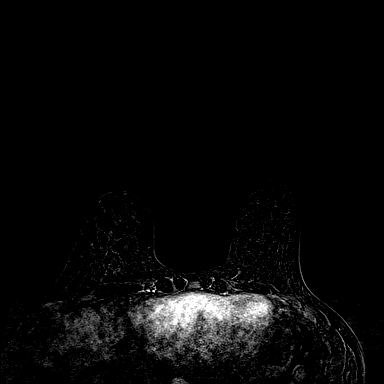
[im 80/160]
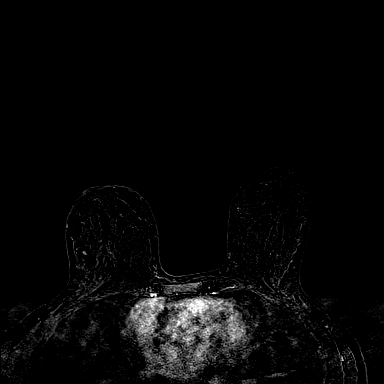
[im 120/160]
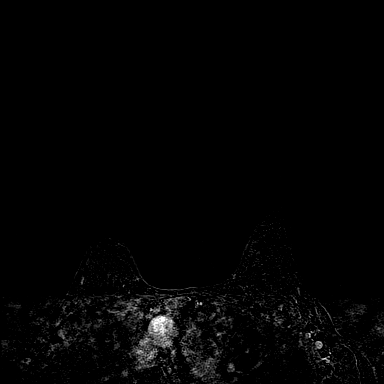
[im 160/160]
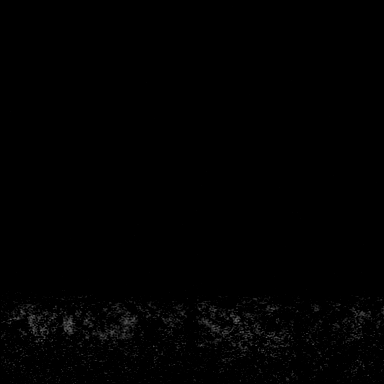

[Series 7: fl3d post-cm 20 · axial · 192.0mm · 0.94mm/px · 1 of 1 slices shown (3 of 3)]
[im 1/1]
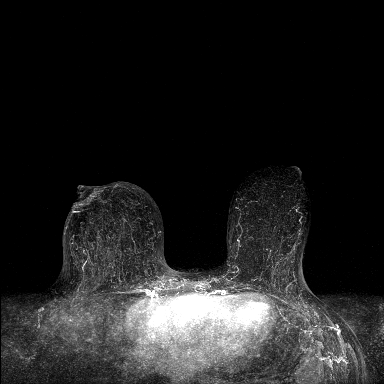

[Series 8: fl3d post-cm 3min · axial · 1.2mm · 0.94mm/px · z∈[-109,+82]mm · 6 of 160 slices shown]
[im 1/160]
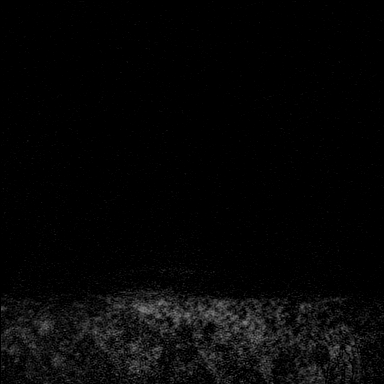
[im 32/160]
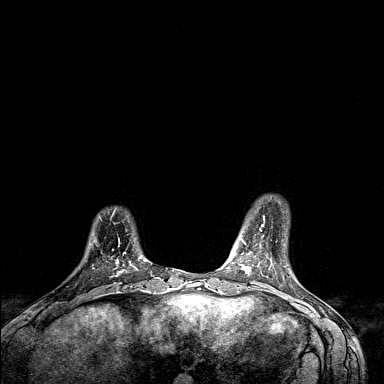
[im 64/160]
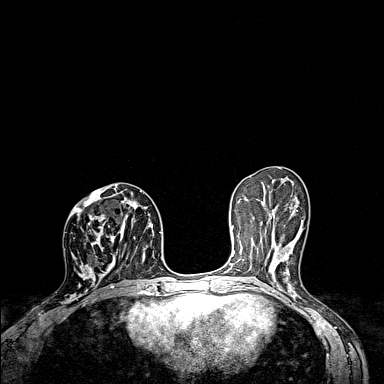
[im 96/160]
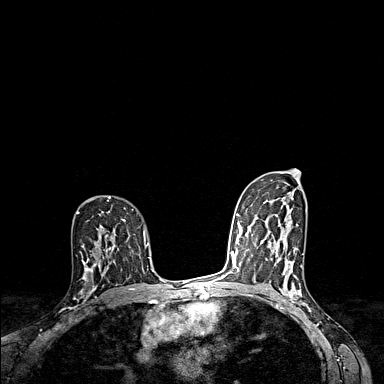
[im 128/160]
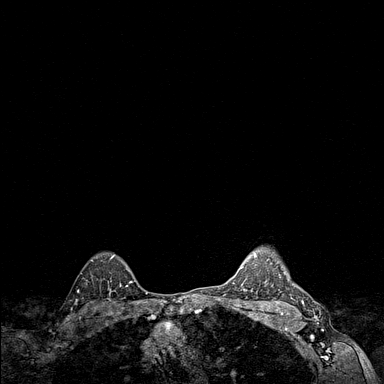
[im 160/160]
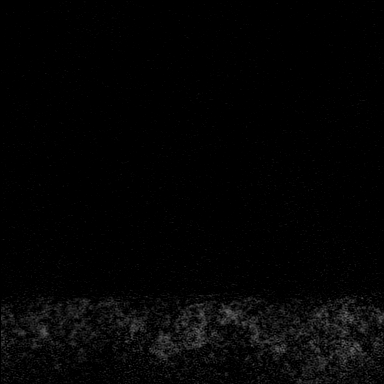

[Series 9: fl3d post-cm 3min_sub · axial · 1.2mm · 0.94mm/px · z∈[-109,+5]mm · 4 of 160 slices shown]
[im 1/160]
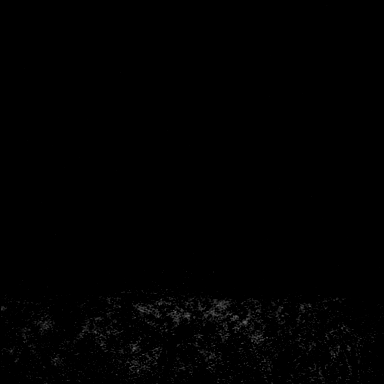
[im 32/160]
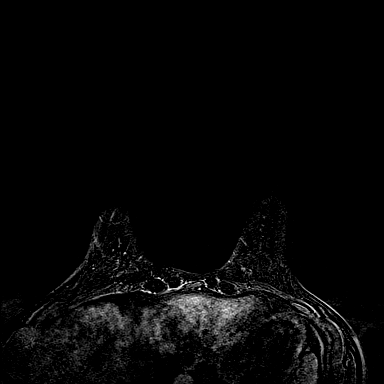
[im 64/160]
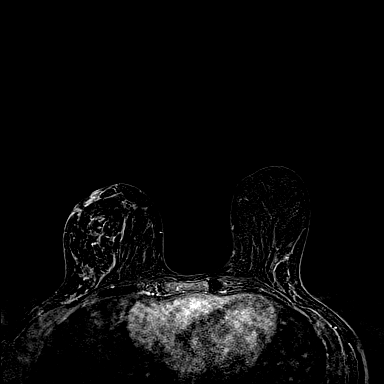
[im 96/160]
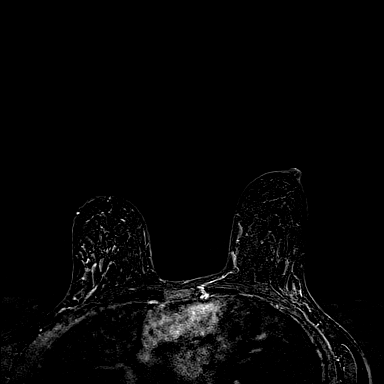

[32 of 48 positions shown; findings below may reference images not displayed]

Three-dimensional MR images were rendered by post-processing of the
original MR data on an independent workstation. The
three-dimensional MR images were interpreted, and findings are
reported in the following complete MRI report for this study. Three
dimensional images were evaluated at the independent [employer]
workstation
FINDINGS: Breast composition: c.  Heterogeneous fibroglandular tissue.

Background parenchymal enhancement: Moderate.

Right breast: Expected postsurgical changes are present in the right
breast with extensive edema as well as periareolar skin thickening
with associated mild enhancement. There is no residual abnormal
enhancement at the site of recent excision or elsewhere in the right
breast. Small enhancing mass compatible with an intramammary lymph
node in the outer right breast is stable dating back to 0385 and
benign.

Left breast: No suspicious rapidly enhancing masses or abnormal
areas of enhancement in the left breast to suggest malignancy.

Lymph nodes: No morphologically abnormal lymph nodes. No internal
mammary lymphadenopathy.

Ancillary findings:  None.
IMPRESSION: 1. Expected postoperative changes from recent right breast excision
with no residual enhancement seen at the surgical site or elsewhere
in the right breast.

2.  No MRI evidence of malignancy in the left breast.

RECOMMENDATION:
Treatment plan for known right breast malignancy.

BI-RADS CATEGORY  6: Known biopsy-proven malignancy.

## 2020-08-21 IMAGING — RF DG CHEST 1V
1 series · 1 of 1 positions shown · non-contrast
Comparison: April 29, 2015

CLINICAL DATA: Port-A-Cath insertion

EXAM:
CHEST  1 VIEW

[Series 1: run · 1 of 1 slices shown]
[im 1/1]
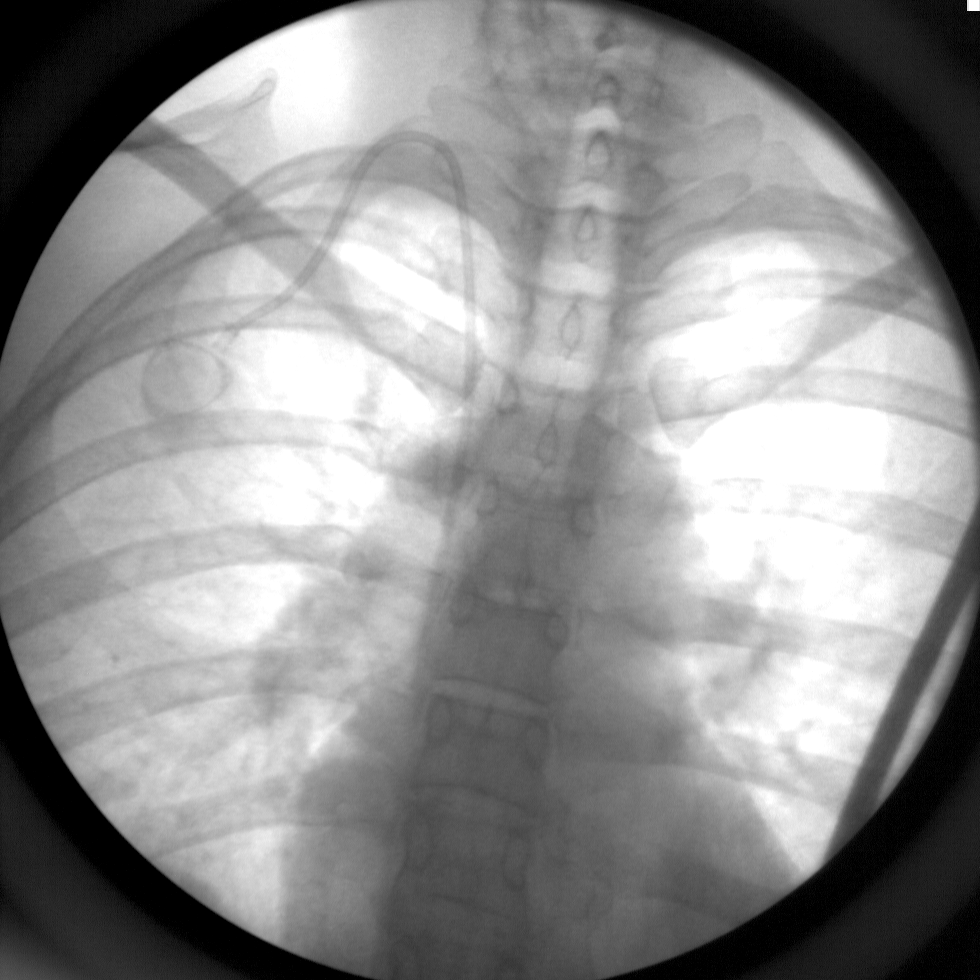

[1 of 1 positions shown; findings below may reference images not displayed]

FINDINGS: Limited frontal view of chest obtained. Port-A-Cath tip is in the
superior vena cava. No pneumothorax evident in visualized lung
regions. Visualized lungs clear. Cardiac silhouette within normal
limits. No adenopathy evident.
IMPRESSION: Limited frontal view. Visualized lungs clear. No pneumothorax
demonstrable on visualized portions of lungs. Port-A-Cath tip in
superior vena cava.

## 2020-09-21 ENCOUNTER — Telehealth: Payer: Self-pay

## 2020-09-21 NOTE — Telephone Encounter (Signed)
Thank you for the update!

## 2020-09-21 NOTE — Telephone Encounter (Signed)
Patient called c/o right lower quadrant pain off and on since Sunday 09/18/20.  Today it has become persistant.  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  She said last week she thought she had UTI and [facility] prescribed Macrobid and she took that for a week. She said she had breast cancer last year and this pain worries her.  I scheduled her for 11 am tomorrow to see Tiffany. SHe said she was comfortable waiting til the morning. I asked her to call back if anything changes and she feel like she needs to be seen sooner.  I did remind her she is right at 2 years since her last AEX and will need to schedule that as well.

## 2020-09-22 ENCOUNTER — Ambulatory Visit: Payer: Managed Care, Other (non HMO) | Admitting: Nurse Practitioner

## 2020-11-16 ENCOUNTER — Other Ambulatory Visit: Payer: Self-pay | Admitting: Adult Health

## 2020-11-16 DIAGNOSIS — C50511 Malignant neoplasm of lower-outer quadrant of right female breast: Secondary | ICD-10-CM

## 2020-11-23 ENCOUNTER — Other Ambulatory Visit: Payer: Managed Care, Other (non HMO)

## 2020-12-01 ENCOUNTER — Inpatient Hospital Stay: Admission: RE | Admit: 2020-12-01 | Payer: Managed Care, Other (non HMO) | Source: Ambulatory Visit

## 2021-02-01 ENCOUNTER — Other Ambulatory Visit: Payer: Self-pay | Admitting: Adult Health

## 2021-02-01 DIAGNOSIS — Z9889 Other specified postprocedural states: Secondary | ICD-10-CM

## 2021-03-08 ENCOUNTER — Ambulatory Visit
Admission: RE | Admit: 2021-03-08 | Discharge: 2021-03-08 | Disposition: A | Discharge status: Home / Self Care | Payer: Managed Care, Other (non HMO) | Source: Ambulatory Visit | Attending: Adult Health | Admitting: Adult Health

## 2021-03-08 DIAGNOSIS — Z9889 Other specified postprocedural states: Secondary | ICD-10-CM

## 2021-03-22 NOTE — Progress Notes (Signed)
Patient Care Team: Patient, No Pcp Per (Inactive) as PCP - General (General Practice) Nicholas Lose, MD as Consulting Physician (Hematology and Oncology) Rolm Bookbinder, MD as Consulting Physician (General Surgery) Kyung Rudd, MD as Consulting Physician (Radiation Oncology)  DIAGNOSIS:    ICD-10-CM   1. Malignant neoplasm of lower-outer quadrant of right breast of female, estrogen receptor positive ([name])  C50.511    Z17.0       SUMMARY OF ONCOLOGIC HISTORY: Oncology History  Malignant neoplasm of lower-outer quadrant of right breast of female, estrogen receptor positive ([name])  01/15/2019 Initial Diagnosis   Screening mammogram detected an area of distortion in the lower central right breast with a possible associated mass. 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 Lumpectomy showed IDC.    02/10/2019 Surgery   Right lumpectomy [name]) ([name]): IDC, grade 1, 0.8cm, involved lateral margin, HER-2 equivocal (2+) by IHC, negative by FISH, ER+ 50%, PR+ 50%, Ki67 10%. No regional lymph nodes were examined.   02/10/2019 Cancer Staging   Staging form: Breast, AJCC 8th Edition - Pathologic stage from 02/10/2019: Stage IA (pT1b, pN0, cM0, G1, ER+, PR+, HER2-, Oncotype DX score: 24)   03/04/2019 Oncotype testing   The Oncotype DX score was 24 predicting a risk of outside the breast recurrence over the next 9 years of 10% if the patient's only systemic therapy is tamoxifen for 5 years.     03/09/2019 Genetic Testing   Negative genetic testing:  No pathogenic variants detected on the [facility] Breast Cancer STAT panel or the Common Hereditary Cancers panel. The report date is 03/09/19.  The Breast Cancer STAT panel offered by [facility] includes sequencing and rearrangement analysis for the following 9 genes:  ATM, BRCA1, BRCA2, CDH1, CHEK2, PALB2, PTEN, STK11 and TP53.  The Common Hereditary Cancers Panel offered by [facility] includes sequencing and/or deletion  duplication testing of the following 48 genes: APC, ATM, AXIN2, BARD1, BMPR1A, BRCA1, BRCA2, BRIP1, CDH1, CDK4, CDKN2A (p14ARF), CDKN2A (p16INK4a), CHEK2, CTNNA1, DICER1, EPCAM (Deletion/duplication testing only), GREM1 (promoter region deletion/duplication testing only), KIT, MEN1, MLH1, MSH2, MSH3, MSH6, MUTYH, NBN, NF1, NHTL1, PALB2, PDGFRA, PMS2, POLD1, POLE, PTEN, RAD50, RAD51C, RAD51D, RNF43, SDHB, SDHC, SDHD, SMAD4, SMARCA4. STK11, TP53, TSC1, TSC2, and VHL.  The following genes were evaluated for sequence changes only: SDHA and HOXB13 c.251G>A variant only.    03/17/2019 Surgery   Right lumpectomy [name]) [phone]): no residual carcinoma and 5 right axillary lymph nodes negative   04/14/2019 - 06/16/2019 Chemotherapy   dexamethasone (DECADRON) 4 MG tablet, 4 mg (100 % of original dose 4 mg), Oral, 2 times daily, 1 of 1 cycle, Start date: 03/10/2019, End date: 07/22/2019. Dose modification: 4 mg (original dose 4 mg, Cycle 0)  palonosetron (ALOXI) injection 0.25 mg, 0.25 mg, Intravenous,  Once, 4 of 4 cycles. Administration: 0.25 mg (04/14/2019), 0.25 mg (05/05/2019), 0.25 mg (05/26/2019), 0.25 mg (06/16/2019)  cyclophosphamide (CYTOXAN) 1,040 mg in sodium chloride 0.9 % 250 mL chemo infusion, 600 mg/m2 = 1,040 mg, Intravenous,  Once, 4 of 4 cycles. Dose modification: 500 mg/m2 (original dose 600 mg/m2, Cycle 2, Reason: Provider Judgment). Administration: 1,040 mg (04/14/2019), 880 mg (05/05/2019), 880 mg (05/26/2019), 880 mg (06/16/2019)  DOCEtaxel (TAXOTERE) 130 mg in sodium chloride 0.9 % 250 mL chemo infusion, 75 mg/m2 = 130 mg, Intravenous,  Once, 4 of 4 cycles. Dose modification: 70 mg/m2 (original dose 75 mg/m2, Cycle 2, Reason: Provider Judgment). Administration: 130 mg (04/14/2019), 120 mg (05/05/2019), 120 mg (05/26/2019), 120 mg (06/16/2019)  07/14/2019 - 08/28/2019 Radiation Therapy   The patient initially received a dose of 50.4 Gy in 28 fractions to the breast using whole-breast tangent  fields. This was delivered using a 3-D conformal technique. The patient then received a boost to the seroma. This delivered an additional 10 Gy in 5 fractions using a 3-field photon boost technique. The total dose was 60.4 Gy.   07/2019 - 09/2019 Anti-estrogen oral therapy   Tamoxifen: Could not tolerate it and stopped after 2 months.     CHIEF COMPLIANT: Follow-up of right breast cancer   INTERVAL HISTORY: Terri Hoover is a 46 y.o. with above-mentioned history of right breast cancer who underwent a right lumpectomy, adjuvant chemotherapy, radiation, and is currently on antiestrogen therapy with tamoxifen. Mammogram on 03/08/2021 showed no evidence of malignancy bilaterally. She presents to the clinic today for follow-up.  Other than mild discomfort at the surgical scar she is doing extremely well.  She exercises regularly and stays active.  ALLERGIES:  has No Known Allergies.  MEDICATIONS:  Current Outpatient Medications  Medication Sig Dispense Refill   FLUoxetine (PROZAC) 40 MG capsule Take 1 capsule (40 mg total) by mouth daily. 90 capsule 3   levothyroxine (SYNTHROID) 75 MCG tablet Take 75 mcg by mouth daily before breakfast.     liothyronine (CYTOMEL) 5 MCG tablet Take 5 mcg by mouth daily.     Magnesium 500 MG TABS Take by mouth.     Multiple Vitamin (MULTIVITAMIN) tablet Take 1 tablet by mouth daily.     triamcinolone cream (KENALOG) 0.5 % Apply 1 application topically 3 (three) times daily. 60 g 2   trimethoprim-polymyxin b (POLYTRIM) ophthalmic solution 1 drop every 3 (three) hours.      No current facility-administered medications for this visit.    PHYSICAL EXAMINATION: ECOG PERFORMANCE STATUS: 1 - Symptomatic but completely ambulatory  Vitals:   03/23/21 0849  BP: 126/81  Pulse: 76  Resp: 18  Temp: (!) 97.3 F (36.3 C)  SpO2: 100%   Filed Weights   03/23/21 0849  Weight: 139 lb 11.2 oz (63.4 kg)    BREAST: No palpable masses or nodules in either right or  left breasts. No palpable axillary supraclavicular or infraclavicular adenopathy no breast tenderness or nipple discharge. (exam performed in the presence of a chaperone)  LABORATORY DATA:  I have reviewed the data as listed CMP Latest Ref Rng & Units 06/16/2019 05/26/2019 05/05/2019  Glucose 70 - 99 mg/dL 98 78 82  BUN 6 - 20 mg/dL 18 16 15   Creatinine 0.44 - 1.00 mg/dL 0.84 0.79 0.79  Sodium 135 - 145 mmol/L 140 140 138  Potassium 3.5 - 5.1 mmol/L 4.4 4.0 3.6  Chloride 98 - 111 mmol/L 106 107 103  CO2 22 - 32 mmol/L 24 26 28   Calcium 8.9 - 10.3 mg/dL 9.2 9.0 9.1  Total Protein 6.5 - 8.1 g/dL 7.0 6.7 6.8  Total Bilirubin 0.3 - 1.2 mg/dL 0.5 0.6 0.6  Alkaline Phos 38 - 126 U/L 69 59 63  AST 15 - 41 U/L 31 22 17   ALT 0 - 44 U/L 29 23 19     Lab Results  Component Value Date   WBC 7.6 06/16/2019   HGB 13.4 06/16/2019   HCT 41.7 06/16/2019   MCV 90.7 06/16/2019   PLT 152 06/16/2019   NEUTROABS 6.3 06/16/2019    ASSESSMENT & PLAN:  Malignant neoplasm of lower-outer quadrant of right breast of female, estrogen receptor positive ([place]) 01/15/2019:screening mammogram detected  area of distortion in the lower central right breast with a possible associated mass. 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 She underwent a lumpectomy with Dr. [name] on 02/10/19 for which pathology showed invasive ductal carcinoma, grade 1, 0.8cm, involved lateral margin, HER-2 equivocal (2+) by IHC, negative by FISH, ER+ 50%, PR+ 50%, Ki67 10%   Oncotype DX recurrence score 24: Distant recurrence at 9 years: 10%; approx 6.5% benefit from chemotherapy.   Genetics were negative Lumpectomy and sentinel node biopsy showed no residual invasive cancer and 5 SLN negative   Treatment plan:  1.  Adjuvant chemotherapy with Taxotere and Cytoxan every 3 weeks x4 cycles from 04/14/2019 through 06/16/2019 2.  Adjuvant radiation 07/15/2019 3.  Adjuvant antiestrogen therapy with tamoxifen 20 mg daily  stopped August 2021 due to intolerance -------------------------------------------------------------------------------------------------------------------------------------------------- Patient could not tolerate tamoxifen because of how it made her feel.  With tamoxifen, She could not exercise.  Because of that she made a decision to discontinue it.   Breast cancer surveillance: 1. Breast exam 03/23/2021: Benign, slight discomfort in the axilla and around the area where the port was removed.  She has a stitch that got left.  It is not bothering her. 2. mammogram 03/08/2021: Breast density category C 3. Breast MRI 06/13/2020: Benign breast density category C   She has been staying very busy at the breast center Return to clinic in 1 year for follow-up    No orders of the defined types were placed in this encounter.  The patient has a good understanding of the overall plan. she agrees with it. she will call with any problems that may develop before the next visit here.  Total time spent: 20 mins including face to face time and time spent for planning, charting and coordination of care  Rulon Eisenmenger, MD, MPH 03/23/2021  I, Thana Ates, am acting as scribe for Dr. Nicholas Lose.  I have reviewed the above documentation for accuracy and completeness, and I agree with the above.

## 2021-03-22 NOTE — Assessment & Plan Note (Signed)
01/15/2019:screening mammogram detected area of distortion in the lower central right breast with a possible associated mass. 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 She underwent a lumpectomy with Dr. 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  Genetics were negative Lumpectomy and sentinel node biopsy showed no residual invasive cancer and 5 SLN negative  Treatment plan: 1.Adjuvant chemotherapy with Taxotere and Cytoxan every 3 weeks x4 cyclesfrom2/16/2021through 06/16/2019 2. Adjuvant radiation5/19/2021 3. Adjuvant antiestrogen therapy with tamoxifen 20 mg daily stopped August 2021 due to intolerance -------------------------------------------------------------------------------------------------------------------------------------------------- Patient could not tolerate tamoxifen because of how it made her feel.  With tamoxifen, She could not exercise.  Because of that she made a decision to discontinue it.  Breast cancer surveillance: 1. Breast exam 03/23/2021: Benign 2. mammogram 03/08/2021: Breast density category C 3. Breast MRI 06/13/2020: Benign breast density category C  Return to clinic in 1 year for follow-up

## 2021-03-23 ENCOUNTER — Inpatient Hospital Stay: Payer: Managed Care, Other (non HMO) | Attending: Hematology and Oncology | Admitting: Hematology and Oncology

## 2021-03-23 ENCOUNTER — Other Ambulatory Visit: Payer: Self-pay

## 2021-03-23 DIAGNOSIS — Z9221 Personal history of antineoplastic chemotherapy: Secondary | ICD-10-CM | POA: Diagnosis not present

## 2021-03-23 DIAGNOSIS — Z853 Personal history of malignant neoplasm of breast: Secondary | ICD-10-CM | POA: Diagnosis present

## 2021-03-23 DIAGNOSIS — C50511 Malignant neoplasm of lower-outer quadrant of right female breast: Secondary | ICD-10-CM | POA: Diagnosis not present

## 2021-03-23 DIAGNOSIS — Z923 Personal history of irradiation: Secondary | ICD-10-CM | POA: Insufficient documentation

## 2021-03-23 DIAGNOSIS — Z170 Estrogen receptor positive status [ER+]: Secondary | ICD-10-CM

## 2021-06-14 ENCOUNTER — Ambulatory Visit
Admission: RE | Admit: 2021-06-14 | Discharge: 2021-06-14 | Disposition: A | Discharge status: Home / Self Care | Payer: Managed Care, Other (non HMO) | Source: Ambulatory Visit | Attending: Adult Health | Admitting: Adult Health

## 2021-06-14 ENCOUNTER — Other Ambulatory Visit: Payer: Self-pay | Admitting: Adult Health

## 2021-06-14 DIAGNOSIS — C50511 Malignant neoplasm of lower-outer quadrant of right female breast: Secondary | ICD-10-CM

## 2021-06-14 MED ORDER — GADOBUTROL 1 MMOL/ML IV SOLN
7.0000 mL | Freq: Once | INTRAVENOUS | Status: AC | PRN
  Administered 2021-06-14: 7 mL via INTRAVENOUS

## 2021-06-15 ENCOUNTER — Other Ambulatory Visit: Payer: Self-pay | Admitting: Adult Health

## 2021-06-15 ENCOUNTER — Telehealth: Payer: Self-pay | Admitting: Adult Health

## 2021-06-15 DIAGNOSIS — R9389 Abnormal findings on diagnostic imaging of other specified body structures: Secondary | ICD-10-CM

## 2021-06-15 NOTE — Telephone Encounter (Signed)
I attempted to call Terri Hoover to discuss her breast MRI results which recommend a right breast biopsy.  The patient did not answer her phone and I got her voicemail however was unable to leave a voicemail message because the mailbox was full.  I will send patient MyChart message. ? ?[name], NP 06/15/21 3:12 PM ?Medical Oncology and Hematology ?[facility] ?[address] ?[place], [place] [address] ?Tel. [phone]    Fax. [phone] ? ?

## 2021-06-29 ENCOUNTER — Ambulatory Visit
Admission: RE | Admit: 2021-06-29 | Discharge: 2021-06-29 | Disposition: A | Discharge status: Home / Self Care | Payer: Managed Care, Other (non HMO) | Source: Ambulatory Visit | Attending: Adult Health | Admitting: Adult Health

## 2021-06-29 DIAGNOSIS — R9389 Abnormal findings on diagnostic imaging of other specified body structures: Secondary | ICD-10-CM

## 2021-06-29 MED ORDER — GADOBUTROL 1 MMOL/ML IV SOLN
7.0000 mL | Freq: Once | INTRAVENOUS | Status: AC | PRN
  Administered 2021-06-29: 7 mL via INTRAVENOUS

## 2021-06-30 ENCOUNTER — Other Ambulatory Visit: Payer: Managed Care, Other (non HMO)

## 2021-07-04 ENCOUNTER — Other Ambulatory Visit: Payer: Self-pay | Admitting: Adult Health

## 2021-07-04 DIAGNOSIS — Z170 Estrogen receptor positive status [ER+]: Secondary | ICD-10-CM

## 2021-07-04 NOTE — Progress Notes (Signed)
Called and [facility] with cb # about pathology results.   ? ?Breast biopsy shows benign tissue.  Repeat MRI recommended in 6 months.  MRI ordered.  ? ?[name], NP 07/04/21 2:14 PM ?Medical Oncology and Hematology ?[facility] ?[address] ?[place], [place] [address] ?Tel. [phone]    Fax. [phone] ? ?

## 2021-11-10 ENCOUNTER — Telehealth: Payer: Self-pay

## 2021-11-10 ENCOUNTER — Ambulatory Visit
Admission: RE | Admit: 2021-11-10 | Discharge: 2021-11-10 | Disposition: A | Discharge status: Home / Self Care | Payer: Managed Care, Other (non HMO) | Source: Ambulatory Visit | Attending: Adult Health | Admitting: Adult Health

## 2021-11-10 DIAGNOSIS — Z170 Estrogen receptor positive status [ER+]: Secondary | ICD-10-CM

## 2021-11-10 MED ORDER — GADOPICLENOL 0.5 MMOL/ML IV SOLN
6.0000 mL | Freq: Once | INTRAVENOUS | Status: AC | PRN
  Administered 2021-11-10: 6 mL via INTRAVENOUS

## 2021-11-10 NOTE — Telephone Encounter (Signed)
Pt is aware of results and verbalized thanks and understanding.

## 2021-11-10 NOTE — Telephone Encounter (Signed)
-----   Message from Gardenia Phlegm, NP sent at 11/10/2021 10:47 AM EDT ----- Please call and give results to patient.  If she has any questions, please have her reach out.   ----- Message ----- From: Interface, Rad Results In Sent: 11/10/2021   9:51 AM EDT To: Gardenia Phlegm, NP

## 2021-11-12 ENCOUNTER — Other Ambulatory Visit: Payer: Managed Care, Other (non HMO)

## 2022-02-08 ENCOUNTER — Other Ambulatory Visit: Payer: Self-pay | Admitting: Hematology and Oncology

## 2022-02-08 DIAGNOSIS — Z1231 Encounter for screening mammogram for malignant neoplasm of breast: Secondary | ICD-10-CM

## 2022-03-15 ENCOUNTER — Ambulatory Visit
Admission: RE | Admit: 2022-03-15 | Discharge: 2022-03-15 | Disposition: A | Discharge status: Home / Self Care | Payer: Managed Care, Other (non HMO) | Source: Ambulatory Visit | Attending: Hematology and Oncology | Admitting: Hematology and Oncology

## 2022-03-15 DIAGNOSIS — Z1231 Encounter for screening mammogram for malignant neoplasm of breast: Secondary | ICD-10-CM

## 2022-03-25 NOTE — Assessment & Plan Note (Deleted)
03/16/2018:screening mammogram detected area of distortion in the lower central right breast with a possible associated mass. 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 She underwent a lumpectomy with Dr. [name] on 02/10/19 for which pathology showed invasive ductal carcinoma, grade 1, 0.8cm, involved lateral margin, HER-2 equivocal (2+) by IHC, negative by FISH, ER+ 50%, PR+ 50%, Ki67 10%   Oncotype DX recurrence score 24: Distant recurrence at 9 years: 10%; approx 6.5% benefit from chemotherapy.   Genetics were negative Lumpectomy and sentinel node biopsy showed no residual invasive cancer and 5 SLN negative   Treatment plan:  1.  Adjuvant chemotherapy with Taxotere and Cytoxan every 3 weeks x4 cycles from 04/14/2019 through 06/16/2019 2.  Adjuvant radiation 07/15/2019 3.  Adjuvant antiestrogen therapy with tamoxifen 20 mg daily stopped August 2021 due to intolerance -------------------------------------------------------------------------------------------------------------------------------------------------- Patient could not tolerate tamoxifen because of how it made her feel.  With tamoxifen, She could not exercise.  Because of that she made a decision to discontinue it.   Breast cancer surveillance: 1. Breast exam 03/25/2022: Benign, slight discomfort in the axilla and around the area where the port was removed.  She has a stitch that got left.  It is not bothering her. 2. mammogram 03/15/2022: Breast density category C 3. Breast MRI 11/10/2021 focal NME at lumpectomy scar: Benign breast density category C   She has been staying very busy at the breast center Return to clinic in 1 year for follow-up

## 2022-03-26 ENCOUNTER — Inpatient Hospital Stay: Payer: Managed Care, Other (non HMO) | Attending: Hematology and Oncology | Admitting: Hematology and Oncology

## 2022-03-26 DIAGNOSIS — Z170 Estrogen receptor positive status [ER+]: Secondary | ICD-10-CM

## 2022-03-26 NOTE — Progress Notes (Incomplete)
Patient Care Team: Patient, No Pcp Per as PCP - General (General Practice) Nicholas Lose, MD as Consulting Physician (Hematology and Oncology) Rolm Bookbinder, MD as Consulting Physician (General Surgery) Kyung Rudd, MD as Consulting Physician (Radiation Oncology)  DIAGNOSIS:  Encounter Diagnosis  Name Primary?   Malignant neoplasm of lower-outer quadrant of right breast of female, estrogen receptor positive ([place]) Yes    SUMMARY OF ONCOLOGIC HISTORY: Oncology History  Malignant neoplasm of lower-outer quadrant of right breast of female, estrogen receptor positive ([place])  01/15/2019 Initial Diagnosis   Screening mammogram detected an area of distortion in the lower central right breast with a possible associated mass. 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 Lumpectomy showed IDC.    02/10/2019 Surgery   Right lumpectomy [name]) ([name]): IDC, grade 1, 0.8cm, involved lateral margin, HER-2 equivocal (2+) by IHC, negative by FISH, ER+ 50%, PR+ 50%, Ki67 10%. No regional lymph nodes were examined.   02/10/2019 Cancer Staging   Staging form: Breast, AJCC 8th Edition - Pathologic stage from 02/10/2019: Stage IA (pT1b, pN0, cM0, G1, ER+, PR+, HER2-, Oncotype DX score: 24)   03/04/2019 Oncotype testing   The Oncotype DX score was 24 predicting a risk of outside the breast recurrence over the next 9 years of 10% if the patient's only systemic therapy is tamoxifen for 5 years.     03/09/2019 Genetic Testing   Negative genetic testing:  No pathogenic variants detected on the [facility] Breast Cancer STAT panel or the Common Hereditary Cancers panel. The report date is 03/09/19.  The Breast Cancer STAT panel offered by [facility] includes sequencing and rearrangement analysis for the following 9 genes:  ATM, BRCA1, BRCA2, CDH1, CHEK2, PALB2, PTEN, STK11 and TP53.  The Common Hereditary Cancers Panel offered by [facility] includes sequencing and/or deletion duplication testing  of the following 48 genes: APC, ATM, AXIN2, BARD1, BMPR1A, BRCA1, BRCA2, BRIP1, CDH1, CDK4, CDKN2A (p14ARF), CDKN2A (p16INK4a), CHEK2, CTNNA1, DICER1, EPCAM (Deletion/duplication testing only), GREM1 (promoter region deletion/duplication testing only), KIT, MEN1, MLH1, MSH2, MSH3, MSH6, MUTYH, NBN, NF1, NHTL1, PALB2, PDGFRA, PMS2, POLD1, POLE, PTEN, RAD50, RAD51C, RAD51D, RNF43, SDHB, SDHC, SDHD, SMAD4, SMARCA4. STK11, TP53, TSC1, TSC2, and VHL.  The following genes were evaluated for sequence changes only: SDHA and HOXB13 c.251G>A variant only.    03/17/2019 Surgery   Right lumpectomy [name]) [phone]): no residual carcinoma and 5 right axillary lymph nodes negative   04/14/2019 - 06/16/2019 Chemotherapy   dexamethasone (DECADRON) 4 MG tablet, 4 mg (100 % of original dose 4 mg), Oral, 2 times daily, 1 of 1 cycle, Start date: 03/10/2019, End date: 07/22/2019. Dose modification: 4 mg (original dose 4 mg, Cycle 0)  palonosetron (ALOXI) injection 0.25 mg, 0.25 mg, Intravenous,  Once, 4 of 4 cycles. Administration: 0.25 mg (04/14/2019), 0.25 mg (05/05/2019), 0.25 mg (05/26/2019), 0.25 mg (06/16/2019)  cyclophosphamide (CYTOXAN) 1,040 mg in sodium chloride 0.9 % 250 mL chemo infusion, 600 mg/m2 = 1,040 mg, Intravenous,  Once, 4 of 4 cycles. Dose modification: 500 mg/m2 (original dose 600 mg/m2, Cycle 2, Reason: Provider Judgment). Administration: 1,040 mg (04/14/2019), 880 mg (05/05/2019), 880 mg (05/26/2019), 880 mg (06/16/2019)  DOCEtaxel (TAXOTERE) 130 mg in sodium chloride 0.9 % 250 mL chemo infusion, 75 mg/m2 = 130 mg, Intravenous,  Once, 4 of 4 cycles. Dose modification: 70 mg/m2 (original dose 75 mg/m2, Cycle 2, Reason: Provider Judgment). Administration: 130 mg (04/14/2019), 120 mg (05/05/2019), 120 mg (05/26/2019), 120 mg (06/16/2019)   07/14/2019 - 08/28/2019 Radiation Therapy  The patient initially received a dose of 50.4 Gy in 28 fractions to the breast using whole-breast tangent fields. This was delivered  using a 3-D conformal technique. The patient then received a boost to the seroma. This delivered an additional 10 Gy in 5 fractions using a 3-field photon boost technique. The total dose was 60.4 Gy.   07/2019 - 09/2019 Anti-estrogen oral therapy   Tamoxifen: Could not tolerate it and stopped after 2 months.     CHIEF COMPLIANT:  Follow-up of right breast cancer     INTERVAL HISTORY: Terri Hoover is a 47 y.o. with above-mentioned history of right breast cancer who underwent a right lumpectomy, adjuvant chemotherapy, radiation, and is currently on antiestrogen therapy with tamoxifen. She presents to the clinic today for follow-up.     ALLERGIES:  has No Known Allergies.  MEDICATIONS:  Current Outpatient Medications  Medication Sig Dispense Refill   levothyroxine (SYNTHROID) 75 MCG tablet Take 75 mcg by mouth daily before breakfast.     liothyronine (CYTOMEL) 5 MCG tablet Take 5 mcg by mouth daily.     Magnesium 500 MG TABS Take by mouth.     Multiple Vitamin (MULTIVITAMIN) tablet Take 1 tablet by mouth daily.     triamcinolone cream (KENALOG) 0.5 % Apply 1 application topically 3 (three) times daily. 60 g 2   No current facility-administered medications for this visit.    PHYSICAL EXAMINATION: ECOG PERFORMANCE STATUS: {CHL ONC ECOG PS:[phone]}  There were no vitals filed for this visit. There were no vitals filed for this visit.  BREAST:*** No palpable masses or nodules in either right or left breasts. No palpable axillary supraclavicular or infraclavicular adenopathy no breast tenderness or nipple discharge. (exam performed in the presence of a chaperone)  LABORATORY DATA:  I have reviewed the data as listed    Latest Ref Rng & Units 06/16/2019   11:35 AM 05/26/2019    9:27 AM 05/05/2019   10:50 AM  CMP  Glucose 70 - 99 mg/dL 98  78  82   BUN 6 - 20 mg/dL '18  16  15   '$ Creatinine 0.44 - 1.00 mg/dL 0.84  0.79  0.79   Sodium 135 - 145 mmol/L 140  140  138   Potassium  3.5 - 5.1 mmol/L 4.4  4.0  3.6   Chloride 98 - 111 mmol/L 106  107  103   CO2 22 - 32 mmol/L '24  26  28   '$ Calcium 8.9 - 10.3 mg/dL 9.2  9.0  9.1   Total Protein 6.5 - 8.1 g/dL 7.0  6.7  6.8   Total Bilirubin 0.3 - 1.2 mg/dL 0.5  0.6  0.6   Alkaline Phos 38 - 126 U/L 69  59  63   AST 15 - 41 U/L '31  22  17   '$ ALT 0 - 44 U/L '29  23  19     '$ Lab Results  Component Value Date   WBC 7.6 06/16/2019   HGB 13.4 06/16/2019   HCT 41.7 06/16/2019   MCV 90.7 06/16/2019   PLT 152 06/16/2019   NEUTROABS 6.3 06/16/2019    ASSESSMENT & PLAN:  No problem-specific Assessment & Plan notes found for this encounter.    No orders of the defined types were placed in this encounter.  The patient has a good understanding of the overall plan. she agrees with it. she will call with any problems that may develop before the next visit here. Total time  spent: 30 mins including face to face time and time spent for planning, charting and co-ordination of care   Suzzette Righter, [place] 03/26/22    I Gardiner Coins am acting as a Education administrator for [employer]  ***

## 2022-04-04 ENCOUNTER — Ambulatory Visit: Payer: Managed Care, Other (non HMO)

## 2022-05-31 ENCOUNTER — Other Ambulatory Visit: Payer: Self-pay | Admitting: Adult Health

## 2022-05-31 DIAGNOSIS — Z1239 Encounter for other screening for malignant neoplasm of breast: Secondary | ICD-10-CM

## 2022-06-07 ENCOUNTER — Ambulatory Visit
Admission: RE | Admit: 2022-06-07 | Discharge: 2022-06-07 | Disposition: A | Discharge status: Home / Self Care | Payer: Managed Care, Other (non HMO) | Source: Ambulatory Visit | Attending: Adult Health | Admitting: Adult Health

## 2022-06-07 ENCOUNTER — Telehealth: Payer: Self-pay | Admitting: *Deleted

## 2022-06-07 DIAGNOSIS — Z1239 Encounter for other screening for malignant neoplasm of breast: Secondary | ICD-10-CM

## 2022-06-07 MED ORDER — GADOPICLENOL 0.5 MMOL/ML IV SOLN
6.0000 mL | Freq: Once | INTRAVENOUS | Status: AC | PRN
  Administered 2022-06-07: 6 mL via INTRAVENOUS

## 2022-06-07 NOTE — Telephone Encounter (Signed)
Per Lillard Anes, NP, called pt to let her know her MRI was negative for malignancy and she should continue with mammogram and MRI annually. Pt verbalized understanding.

## 2022-06-18 ENCOUNTER — Other Ambulatory Visit: Payer: Managed Care, Other (non HMO)

## 2022-07-12 ENCOUNTER — Other Ambulatory Visit: Payer: Self-pay | Admitting: Anesthesiology

## 2022-07-12 ENCOUNTER — Ambulatory Visit
Admission: RE | Admit: 2022-07-12 | Discharge: 2022-07-12 | Disposition: A | Discharge status: Home / Self Care | Payer: Managed Care, Other (non HMO) | Source: Ambulatory Visit | Attending: Anesthesiology | Admitting: Anesthesiology

## 2022-07-12 DIAGNOSIS — M5459 Other low back pain: Secondary | ICD-10-CM

## 2022-07-12 DIAGNOSIS — M5136 Other intervertebral disc degeneration, lumbar region: Secondary | ICD-10-CM

## 2022-07-12 DIAGNOSIS — M47816 Spondylosis without myelopathy or radiculopathy, lumbar region: Secondary | ICD-10-CM

## 2022-07-17 ENCOUNTER — Other Ambulatory Visit: Payer: Self-pay | Admitting: Family Medicine

## 2022-07-17 ENCOUNTER — Other Ambulatory Visit: Payer: Self-pay | Admitting: Adult Health Nurse Practitioner

## 2022-07-17 DIAGNOSIS — M5431 Sciatica, right side: Secondary | ICD-10-CM

## 2022-07-20 ENCOUNTER — Ambulatory Visit
Admission: RE | Admit: 2022-07-20 | Discharge: 2022-07-20 | Disposition: A | Discharge status: Home / Self Care | Payer: Managed Care, Other (non HMO) | Source: Ambulatory Visit | Attending: Adult Health Nurse Practitioner | Admitting: Adult Health Nurse Practitioner

## 2022-07-20 DIAGNOSIS — M5431 Sciatica, right side: Secondary | ICD-10-CM

## 2022-07-20 MED ORDER — METHYLPREDNISOLONE ACETATE 40 MG/ML INJ SUSP (RADIOLOG
80.0000 mg | Freq: Once | INTRAMUSCULAR | Status: AC
  Administered 2022-07-20: 80 mg via EPIDURAL

## 2022-07-20 MED ORDER — IOPAMIDOL (ISOVUE-M 200) INJECTION 41%
1.0000 mL | Freq: Once | INTRAMUSCULAR | Status: AC
  Administered 2022-07-20: 1 mL via EPIDURAL

## 2022-07-20 NOTE — Discharge Instructions (Signed)

## 2022-07-27 ENCOUNTER — Ambulatory Visit: Payer: Managed Care, Other (non HMO) | Admitting: Physical Therapy

## 2022-08-03 ENCOUNTER — Encounter: Payer: Self-pay | Admitting: Adult Health Nurse Practitioner

## 2022-08-03 ENCOUNTER — Other Ambulatory Visit: Payer: Self-pay | Admitting: Adult Health Nurse Practitioner

## 2022-08-03 ENCOUNTER — Other Ambulatory Visit: Payer: Self-pay | Admitting: Family Medicine

## 2022-08-03 DIAGNOSIS — M5431 Sciatica, right side: Secondary | ICD-10-CM

## 2022-08-06 ENCOUNTER — Ambulatory Visit
Admission: RE | Admit: 2022-08-06 | Discharge: 2022-08-06 | Disposition: A | Discharge status: Home / Self Care | Payer: Managed Care, Other (non HMO) | Source: Ambulatory Visit | Attending: Family Medicine | Admitting: Family Medicine

## 2022-08-06 DIAGNOSIS — M5431 Sciatica, right side: Secondary | ICD-10-CM

## 2022-08-06 MED ORDER — IOPAMIDOL (ISOVUE-M 200) INJECTION 41%
1.0000 mL | Freq: Once | INTRAMUSCULAR | Status: AC
  Administered 2022-08-06: 1 mL via EPIDURAL

## 2022-08-06 MED ORDER — METHYLPREDNISOLONE ACETATE 40 MG/ML INJ SUSP (RADIOLOG
80.0000 mg | Freq: Once | INTRAMUSCULAR | Status: AC
  Administered 2022-08-06: 80 mg via EPIDURAL

## 2022-08-06 NOTE — Discharge Instructions (Signed)

## 2022-12-12 NOTE — Telephone Encounter (Signed)
TC

## 2023-01-01 ENCOUNTER — Inpatient Hospital Stay: Payer: Managed Care, Other (non HMO) | Admitting: Hematology and Oncology

## 2023-02-07 ENCOUNTER — Telehealth: Payer: Self-pay

## 2023-02-07 ENCOUNTER — Encounter: Payer: Self-pay | Admitting: Hematology and Oncology

## 2023-02-07 ENCOUNTER — Inpatient Hospital Stay: Payer: Managed Care, Other (non HMO) | Attending: Hematology and Oncology | Admitting: Hematology and Oncology

## 2023-02-07 VITALS — BP 140/77 | HR 73 | Temp 98.3°F | Resp 18 | Ht 65.5 in | Wt 138.9 lb

## 2023-02-07 DIAGNOSIS — Z170 Estrogen receptor positive status [ER+]: Secondary | ICD-10-CM | POA: Diagnosis not present

## 2023-02-07 DIAGNOSIS — Z923 Personal history of irradiation: Secondary | ICD-10-CM | POA: Diagnosis not present

## 2023-02-07 DIAGNOSIS — Z9221 Personal history of antineoplastic chemotherapy: Secondary | ICD-10-CM | POA: Diagnosis not present

## 2023-02-07 DIAGNOSIS — C50511 Malignant neoplasm of lower-outer quadrant of right female breast: Secondary | ICD-10-CM | POA: Diagnosis present

## 2023-02-07 DIAGNOSIS — Z79810 Long term (current) use of selective estrogen receptor modulators (SERMs): Secondary | ICD-10-CM | POA: Insufficient documentation

## 2023-02-07 DIAGNOSIS — Z1231 Encounter for screening mammogram for malignant neoplasm of breast: Secondary | ICD-10-CM

## 2023-02-07 MED ORDER — ESTRADIOL 0.1 MG/GM VA CREA: 1.0000 | TOPICAL_CREAM | Freq: Every day | VAGINAL | 12 refills | Status: DC

## 2023-02-07 NOTE — Progress Notes (Signed)
Patient Care Team: Patient, No Pcp Per as PCP - General (General Practice) Serena Croissant, MD as Consulting Physician (Hematology and Oncology) Emelia Loron, MD as Consulting Physician (General Surgery) Dorothy Puffer, MD as Consulting Physician (Radiation Oncology)  DIAGNOSIS:  Encounter Diagnosis  Name Primary?   Malignant neoplasm of lower-outer quadrant of right breast of female, estrogen receptor positive (HCC) Yes    SUMMARY OF ONCOLOGIC HISTORY: Oncology History  Malignant neoplasm of lower-outer quadrant of right breast of female, estrogen receptor positive (HCC)  01/15/2019 Initial Diagnosis   Screening mammogram detected an area of distortion in the lower central right breast with a possible associated mass. 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 Lumpectomy showed IDC.    02/10/2019 Surgery   Right lumpectomy Dwain Sarna) ([name]): IDC, grade 1, 0.8cm, involved lateral margin, HER-2 equivocal (2+) by IHC, negative by FISH, ER+ 50%, PR+ 50%, Ki67 10%. No regional lymph nodes were examined.   02/10/2019 Cancer Staging   Staging form: Breast, AJCC 8th Edition - Pathologic stage from 02/10/2019: Stage IA (pT1b, pN0, cM0, G1, ER+, PR+, HER2-, Oncotype DX score: 24)   03/04/2019 Oncotype testing   The Oncotype DX score was 24 predicting a risk of outside the breast recurrence over the next 9 years of 10% if the patient's only systemic therapy is tamoxifen for 5 years.     03/09/2019 Genetic Testing   Negative genetic testing:  No pathogenic variants detected on the [facility] Breast Cancer STAT panel or the Common Hereditary Cancers panel. The report date is 03/09/19.  The Breast Cancer STAT panel offered by [facility] includes sequencing and rearrangement analysis for the following 9 genes:  ATM, BRCA1, BRCA2, CDH1, CHEK2, PALB2, PTEN, STK11 and TP53.  The Common Hereditary Cancers Panel offered by [facility] includes sequencing and/or deletion duplication testing  of the following 48 genes: APC, ATM, AXIN2, BARD1, BMPR1A, BRCA1, BRCA2, BRIP1, CDH1, CDK4, CDKN2A (p14ARF), CDKN2A (p16INK4a), CHEK2, CTNNA1, DICER1, EPCAM (Deletion/duplication testing only), GREM1 (promoter region deletion/duplication testing only), KIT, MEN1, MLH1, MSH2, MSH3, MSH6, MUTYH, NBN, NF1, NHTL1, PALB2, PDGFRA, PMS2, POLD1, POLE, PTEN, RAD50, RAD51C, RAD51D, RNF43, SDHB, SDHC, SDHD, SMAD4, SMARCA4. STK11, TP53, TSC1, TSC2, and VHL.  The following genes were evaluated for sequence changes only: SDHA and HOXB13 c.251G>A variant only.    03/17/2019 Surgery   Right lumpectomy Dwain Sarna) [phone]): no residual carcinoma and 5 right axillary lymph nodes negative   04/14/2019 - 06/16/2019 Chemotherapy   dexamethasone (DECADRON) 4 MG tablet, 4 mg (100 % of original dose 4 mg), Oral, 2 times daily, 1 of 1 cycle, Start date: 03/10/2019, End date: 07/22/2019. Dose modification: 4 mg (original dose 4 mg, Cycle 0)  palonosetron (ALOXI) injection 0.25 mg, 0.25 mg, Intravenous,  Once, 4 of 4 cycles. Administration: 0.25 mg (04/14/2019), 0.25 mg (05/05/2019), 0.25 mg (05/26/2019), 0.25 mg (06/16/2019)  cyclophosphamide (CYTOXAN) 1,040 mg in sodium chloride 0.9 % 250 mL chemo infusion, 600 mg/m2 = 1,040 mg, Intravenous,  Once, 4 of 4 cycles. Dose modification: 500 mg/m2 (original dose 600 mg/m2, Cycle 2, Reason: Provider Judgment). Administration: 1,040 mg (04/14/2019), 880 mg (05/05/2019), 880 mg (05/26/2019), 880 mg (06/16/2019)  DOCEtaxel (TAXOTERE) 130 mg in sodium chloride 0.9 % 250 mL chemo infusion, 75 mg/m2 = 130 mg, Intravenous,  Once, 4 of 4 cycles. Dose modification: 70 mg/m2 (original dose 75 mg/m2, Cycle 2, Reason: Provider Judgment). Administration: 130 mg (04/14/2019), 120 mg (05/05/2019), 120 mg (05/26/2019), 120 mg (06/16/2019)   07/14/2019 - 08/28/2019 Radiation Therapy  The patient initially received a dose of 50.4 Gy in 28 fractions to the breast using whole-breast tangent fields. This was delivered  using a 3-D conformal technique. The patient then received a boost to the seroma. This delivered an additional 10 Gy in 5 fractions using a 3-field photon boost technique. The total dose was 60.4 Gy.   07/2019 - 09/2019 Anti-estrogen oral therapy   Tamoxifen: Could not tolerate it and stopped after 2 months.     CHIEF COMPLIANT: Surveillance of breast cancer  HISTORY OF PRESENT ILLNESS:   History of Present Illness   Tedi, a breast cancer survivor of four years, presents for a routine follow-up. She has been diligent with her surveillance, undergoing mammograms and MRIs every six months. A couple of years ago, she had an MRI-guided biopsy which was benign. She has not been on tamoxifen and has been experiencing menopausal symptoms, including hot flashes. She inquires about the possibility of hormone replacement therapy and other alternatives for managing these symptoms. She also expresses interest in a new blood-based breast cancer surveillance test called Gardent Reveal.         ALLERGIES:  has no known allergies.  MEDICATIONS:  Current Outpatient Medications  Medication Sig Dispense Refill   estradiol (ESTRACE VAGINAL) 0.1 MG/GM vaginal cream Place 1 Applicatorful vaginally at bedtime. 42.5 g 12   ALPRAZolam (XANAX) 0.25 MG tablet Take one tablet daily as needed for anxiety     FLUoxetine (PROZAC) 10 MG capsule Take by mouth.     levothyroxine (SYNTHROID) 75 MCG tablet Take 75 mcg by mouth daily before breakfast.     liothyronine (CYTOMEL) 5 MCG tablet Take 5 mcg by mouth daily.     Magnesium 500 MG TABS Take by mouth.     meloxicam (MOBIC) 7.5 MG tablet Take by mouth.     Multiple Vitamin (MULTIVITAMIN) tablet Take 1 tablet by mouth daily.     triamcinolone cream (KENALOG) 0.5 % Apply 1 application topically 3 (three) times daily. 60 g 2   No current facility-administered medications for this visit.    PHYSICAL EXAMINATION: ECOG PERFORMANCE STATUS: 1 - Symptomatic but completely  ambulatory  Vitals:   02/07/23 0936  BP: (!) 140/77  Pulse: 73  Resp: 18  Temp: 98.3 F (36.8 C)  SpO2: 100%   Filed Weights   02/07/23 0936  Weight: 138 lb 14.4 oz (63 kg)      LABORATORY DATA:  I have reviewed the data as listed    Latest Ref Rng & Units 06/16/2019   11:35 AM 05/26/2019    9:27 AM 05/05/2019   10:50 AM  CMP  Glucose 70 - 99 mg/dL 98  78  82   BUN 6 - 20 mg/dL 18  16  15    Creatinine 0.44 - 1.00 mg/dL 1.61  0.96  0.45   Sodium 135 - 145 mmol/L 140  140  138   Potassium 3.5 - 5.1 mmol/L 4.4  4.0  3.6   Chloride 98 - 111 mmol/L 106  107  103   CO2 22 - 32 mmol/L 24  26  28    Calcium 8.9 - 10.3 mg/dL 9.2  9.0  9.1   Total Protein 6.5 - 8.1 g/dL 7.0  6.7  6.8   Total Bilirubin 0.3 - 1.2 mg/dL 0.5  0.6  0.6   Alkaline Phos 38 - 126 U/L 69  59  63   AST 15 - 41 U/L 31  22  17  ALT 0 - 44 U/L 29  23  19      Lab Results  Component Value Date   WBC 7.6 06/16/2019   HGB 13.4 06/16/2019   HCT 41.7 06/16/2019   MCV 90.7 06/16/2019   PLT 152 06/16/2019   NEUTROABS 6.3 06/16/2019    ASSESSMENT & PLAN:  Malignant neoplasm of lower-outer quadrant of right breast of female, estrogen receptor positive (HCC) 01/15/2019:screening mammogram detected area of distortion in the lower central right breast with a possible associated mass. 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 She underwent a lumpectomy with Dr. Dwain Sarna on 02/10/19 for which pathology showed invasive ductal carcinoma, grade 1, 0.8cm, involved lateral margin, HER-2 equivocal (2+) by IHC, negative by FISH, ER+ 50%, PR+ 50%, Ki67 10%   Oncotype DX recurrence score 24: Distant recurrence at 9 years: 10%; approx 6.5% benefit from chemotherapy.   Genetics were negative Lumpectomy and sentinel node biopsy showed no residual invasive cancer and 5 SLN negative   Treatment plan:  1.  Adjuvant chemotherapy with Taxotere and Cytoxan every 3 weeks x4 cycles from 04/14/2019 through  06/16/2019 2.  Adjuvant radiation 07/15/2019 3.  Adjuvant antiestrogen therapy with tamoxifen 20 mg daily stopped August 2021 due to intolerance -------------------------------------------------------------------------------------------------------------------------------------------------- Patient could not tolerate tamoxifen because of how it made her feel.  With tamoxifen, She could not exercise.  Because of that she made a decision to discontinue it.   Breast cancer surveillance: 1. Breast exam 02/07/2023: Benign 2. mammogram 03/08/2021: Breast density category C 3. Breast MRI 11/10/2021: Significantly smaller area of focal non-mass enhancement adjacent to the lumpectomy scar.  Benign breast density category C   She has been staying very busy at [facility] radiology place in [place].  Vaginal dryness: I sent a prescription for Estrace.  She could use it twice a week.  I discussed with her that we do not recommend estrogen replacement therapy.  We would like her to do guardant reveal testing for minimal residual disease for surveillance.  She will continue getting mammograms alternating with breast MRIs. Return to clinic in 1 year for follow-up     Orders Placed This Encounter  Procedures   MR BREAST BILATERAL W WO CONTRAST INC CAD    Standing Status:   Future    Expected Date:   08/08/2023    Expiration Date:   02/07/2024    If indicated for the ordered procedure, I authorize the administration of contrast media per Radiology protocol:   Yes    What is the patient's sedation requirement?:   No Sedation    Does the patient have a pacemaker or implanted devices?:   No    Preferred imaging location?:   [facility] (table limit-550lbs)    Release to patient:   Immediate   The patient has a good understanding of the overall plan. she agrees with it. she will call with any problems that may develop before the next visit here. Total time spent: 30 mins including face to face time and  time spent for planning, charting and co-ordination of care   Tamsen Meek, MD 02/07/23

## 2023-02-07 NOTE — Assessment & Plan Note (Addendum)
01/15/2019:screening mammogram detected area of distortion in the lower central right breast with a possible associated mass. 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 She underwent a lumpectomy with Dr. Dwain Sarna on 02/10/19 for which pathology showed invasive ductal carcinoma, grade 1, 0.8cm, involved lateral margin, HER-2 equivocal (2+) by IHC, negative by FISH, ER+ 50%, PR+ 50%, Ki67 10%   Oncotype DX recurrence score 24: Distant recurrence at 9 years: 10%; approx 6.5% benefit from chemotherapy.   Genetics were negative Lumpectomy and sentinel node biopsy showed no residual invasive cancer and 5 SLN negative   Treatment plan:  1.  Adjuvant chemotherapy with Taxotere and Cytoxan every 3 weeks x4 cycles from 04/14/2019 through 06/16/2019 2.  Adjuvant radiation 07/15/2019 3.  Adjuvant antiestrogen therapy with tamoxifen 20 mg daily stopped August 2021 due to intolerance -------------------------------------------------------------------------------------------------------------------------------------------------- Patient could not tolerate tamoxifen because of how it made her feel.  With tamoxifen, She could not exercise.  Because of that she made a decision to discontinue it.   Breast cancer surveillance: 1. Breast exam 02/07/2023: Benign 2. mammogram 03/08/2021: Breast density category C 3. Breast MRI 11/10/2021: Significantly smaller area of focal non-mass enhancement adjacent to the lumpectomy scar.  Benign breast density category C   She has been staying very busy at [facility] radiology place in [place].  Vaginal dryness: I sent a prescription for Estrace.  She could use it twice a week.  I discussed with her that we do not recommend estrogen replacement therapy.  We would like her to do guardant reveal testing for minimal residual disease for surveillance.  She will continue getting mammograms alternating with breast MRIs. Return to clinic in 1 year for follow-up

## 2023-02-07 NOTE — Telephone Encounter (Signed)
 Per md orders entered for Guardant Reveal and all supported documents faxed to [phone]. Faxed confirmation was received.

## 2023-02-14 ENCOUNTER — Other Ambulatory Visit: Payer: Self-pay | Admitting: Hematology and Oncology

## 2023-02-14 DIAGNOSIS — C50511 Malignant neoplasm of lower-outer quadrant of right female breast: Secondary | ICD-10-CM

## 2023-03-21 ENCOUNTER — Telehealth: Payer: Self-pay

## 2023-03-21 ENCOUNTER — Ambulatory Visit
Admission: RE | Admit: 2023-03-21 | Discharge: 2023-03-21 | Disposition: A | Discharge status: Home / Self Care | Payer: Managed Care, Other (non HMO) | Source: Ambulatory Visit | Attending: Hematology and Oncology | Admitting: Hematology and Oncology

## 2023-03-21 ENCOUNTER — Other Ambulatory Visit: Payer: Self-pay | Admitting: Hematology and Oncology

## 2023-03-21 DIAGNOSIS — R921 Mammographic calcification found on diagnostic imaging of breast: Secondary | ICD-10-CM

## 2023-03-21 DIAGNOSIS — C50511 Malignant neoplasm of lower-outer quadrant of right female breast: Secondary | ICD-10-CM

## 2023-03-21 NOTE — Telephone Encounter (Signed)
Attempted to called pt per MD to advise Guardant reveal testing was negative/not detected. Received VM lvm for pt to call back to get results.

## 2023-03-21 NOTE — Telephone Encounter (Signed)
Enter in error

## 2023-03-22 ENCOUNTER — Encounter: Payer: Self-pay | Admitting: Hematology and Oncology

## 2023-03-29 ENCOUNTER — Ambulatory Visit
Admission: RE | Admit: 2023-03-29 | Discharge: 2023-03-29 | Disposition: A | Discharge status: Home / Self Care | Payer: Managed Care, Other (non HMO) | Source: Ambulatory Visit | Attending: Hematology and Oncology | Admitting: Hematology and Oncology

## 2023-03-29 DIAGNOSIS — C50511 Malignant neoplasm of lower-outer quadrant of right female breast: Secondary | ICD-10-CM

## 2023-03-29 DIAGNOSIS — R921 Mammographic calcification found on diagnostic imaging of breast: Secondary | ICD-10-CM

## 2023-03-29 HISTORY — PX: BREAST BIOPSY: SHX20

## 2023-04-02 LAB — SURGICAL PATHOLOGY

## 2023-07-03 ENCOUNTER — Encounter: Payer: Self-pay | Admitting: Hematology and Oncology

## 2023-07-04 ENCOUNTER — Encounter: Payer: Self-pay | Admitting: Hematology and Oncology

## 2023-08-07 ENCOUNTER — Ambulatory Visit
Admission: RE | Admit: 2023-08-07 | Discharge: 2023-08-07 | Discharge status: Home / Self Care | Source: Ambulatory Visit | Attending: Hematology and Oncology

## 2023-08-07 DIAGNOSIS — Z170 Estrogen receptor positive status [ER+]: Secondary | ICD-10-CM

## 2023-08-07 MED ORDER — GADOPICLENOL 0.5 MMOL/ML IV SOLN
6.0000 mL | Freq: Once | INTRAVENOUS | Status: AC | PRN
  Administered 2023-08-07: 6 mL via INTRAVENOUS

## 2023-08-08 ENCOUNTER — Other Ambulatory Visit: Payer: Managed Care, Other (non HMO)

## 2023-09-24 ENCOUNTER — Telehealth: Payer: Self-pay | Admitting: *Deleted

## 2023-09-24 NOTE — Telephone Encounter (Signed)
 Per MD request RN placed call to pt with recent Guardant Reveal results being negative.  Pt educated and verbalized understanding.

## 2023-09-25 ENCOUNTER — Encounter: Payer: Self-pay | Admitting: Hematology and Oncology

## 2024-02-10 ENCOUNTER — Inpatient Hospital Stay: Payer: Managed Care, Other (non HMO) | Attending: Hematology and Oncology | Admitting: Hematology and Oncology

## 2024-02-10 VITALS — BP 108/76 | HR 69 | Temp 97.6°F | Resp 18 | Wt 140.6 lb

## 2024-02-10 DIAGNOSIS — Z9221 Personal history of antineoplastic chemotherapy: Secondary | ICD-10-CM | POA: Diagnosis not present

## 2024-02-10 DIAGNOSIS — Z170 Estrogen receptor positive status [ER+]: Secondary | ICD-10-CM | POA: Diagnosis not present

## 2024-02-10 DIAGNOSIS — R9230 Dense breasts, unspecified: Secondary | ICD-10-CM | POA: Insufficient documentation

## 2024-02-10 DIAGNOSIS — Z923 Personal history of irradiation: Secondary | ICD-10-CM | POA: Insufficient documentation

## 2024-02-10 DIAGNOSIS — C50511 Malignant neoplasm of lower-outer quadrant of right female breast: Secondary | ICD-10-CM

## 2024-02-10 DIAGNOSIS — Z853 Personal history of malignant neoplasm of breast: Secondary | ICD-10-CM | POA: Diagnosis present

## 2024-02-10 MED ORDER — ESTRADIOL 0.01 % VA CREA: 1.0000 | TOPICAL_CREAM | VAGINAL | 12 refills | Status: AC

## 2024-02-10 NOTE — Progress Notes (Signed)
 Patient Care Team: Patient, No Pcp Per as PCP - General (General Practice) Odean Potts, MD as Consulting Physician (Hematology and Oncology) Ebbie Cough, MD as Consulting Physician (General Surgery) Dewey Rush, MD as Consulting Physician (Radiation Oncology)  DIAGNOSIS:  Encounter Diagnosis  Name Primary?   Malignant neoplasm of lower-outer quadrant of right breast of female, estrogen receptor positive (HCC) Yes    SUMMARY OF ONCOLOGIC HISTORY: Oncology History  Malignant neoplasm of lower-outer quadrant of right breast of female, estrogen receptor positive (HCC)  01/15/2019 Initial Diagnosis   Screening mammogram detected an area of distortion in the lower central right breast with a possible associated mass. 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 Lumpectomy showed IDC.    02/10/2019 Surgery   Right lumpectomy [name] ([name]): IDC, grade 1, 0.8cm, involved lateral margin, HER-2 equivocal (2+) by IHC, negative by FISH, ER+ 50%, PR+ 50%, Ki67 10%. No regional lymph nodes were examined.   02/10/2019 Cancer Staging   Staging form: Breast, AJCC 8th Edition - Pathologic stage from 02/10/2019: Stage IA (pT1b, pN0, cM0, G1, ER+, PR+, HER2-, Oncotype DX score: 24)   03/04/2019 Oncotype testing   The Oncotype DX score was 24 predicting a risk of outside the breast recurrence over the next 9 years of 10% if the patient's only systemic therapy is tamoxifen  for 5 years.     03/09/2019 Genetic Testing   Negative genetic testing:  No pathogenic variants detected on the [facility] Breast Cancer STAT panel or the Common Hereditary Cancers panel. The report date is 03/09/19.  The Breast Cancer STAT panel offered by [facility] includes sequencing and rearrangement analysis for the following 9 genes:  ATM, BRCA1, BRCA2, CDH1, CHEK2, PALB2, PTEN, STK11 and TP53.  The Common Hereditary Cancers Panel offered by [facility] includes sequencing and/or deletion duplication testing  of the following 48 genes: APC, ATM, AXIN2, BARD1, BMPR1A, BRCA1, BRCA2, BRIP1, CDH1, CDK4, CDKN2A (p14ARF), CDKN2A (p16INK4a), CHEK2, CTNNA1, DICER1, EPCAM (Deletion/duplication testing only), GREM1 (promoter region deletion/duplication testing only), KIT, MEN1, MLH1, MSH2, MSH3, MSH6, MUTYH, NBN, NF1, NHTL1, PALB2, PDGFRA, PMS2, POLD1, POLE, PTEN, RAD50, RAD51C, RAD51D, RNF43, SDHB, SDHC, SDHD, SMAD4, SMARCA4. STK11, TP53, TSC1, TSC2, and VHL.  The following genes were evaluated for sequence changes only: SDHA and HOXB13 c.251G>A variant only.    03/17/2019 Surgery   Right lumpectomy [name]: no residual carcinoma and 5 right axillary lymph nodes negative   04/14/2019 - 06/16/2019 Chemotherapy   dexamethasone  (DECADRON ) 4 MG tablet, 4 mg (100 % of original dose 4 mg), Oral, 2 times daily, 1 of 1 cycle, Start date: 03/10/2019, End date: 07/22/2019. Dose modification: 4 mg (original dose 4 mg, Cycle 0)  palonosetron  (ALOXI ) injection 0.25 mg, 0.25 mg, Intravenous,  Once, 4 of 4 cycles. Administration: 0.25 mg (04/14/2019), 0.25 mg (05/05/2019), 0.25 mg (05/26/2019), 0.25 mg (06/16/2019)  cyclophosphamide  (CYTOXAN ) 1,040 mg in sodium chloride  0.9 % 250 mL chemo infusion, 600 mg/m2 = 1,040 mg, Intravenous,  Once, 4 of 4 cycles. Dose modification: 500 mg/m2 (original dose 600 mg/m2, Cycle 2, Reason: Provider Judgment). Administration: 1,040 mg (04/14/2019), 880 mg (05/05/2019), 880 mg (05/26/2019), 880 mg (06/16/2019)  DOCEtaxel  (TAXOTERE ) 130 mg in sodium chloride  0.9 % 250 mL chemo infusion, 75 mg/m2 = 130 mg, Intravenous,  Once, 4 of 4 cycles. Dose modification: 70 mg/m2 (original dose 75 mg/m2, Cycle 2, Reason: Provider Judgment). Administration: 130 mg (04/14/2019), 120 mg (05/05/2019), 120 mg (05/26/2019), 120 mg (06/16/2019)   07/14/2019 - 08/28/2019 Radiation Therapy  The patient initially received a dose of 50.4 Gy in 28 fractions to the breast using whole-breast tangent fields. This was delivered  using a 3-D conformal technique. The patient then received a boost to the seroma. This delivered an additional 10 Gy in 5 fractions using a 3-field photon boost technique. The total dose was 60.4 Gy.   07/2019 - 09/2019 Anti-estrogen oral therapy   Tamoxifen : Could not tolerate it and stopped after 2 months.     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  She is five years from her initial right breast cancer diagnosis and had a second lumpectomy in January 2021. She has no new breast symptoms. Surveillance mammograms, MRIs, and multiple MRI-guided biopsies have all been benign, with the most recent biopsy in January 2025 showing only sclerosing adenosis and surgical changes. She remains in the [facility] surveillance program every six months.  She stopped adjuvant tamoxifen  after about three months because of significant intolerance. She manages menopausal symptoms with estradiol  vaginal cream twice weekly with good control and no other genitourinary symptoms.  She has not followed with surgical oncology since port removal. She describes emotional distress related to a recent breast cancer death in her daughter's social circle but otherwise feels her daughters are well and her overall health and quality of life are good.  Feb 07, 2023: Surveillance visit for estrogen receptor-positive right breast cancer; benign breast exam, no evidence of recurrence. Discussed menopausal symptoms and hormone replacement therapy, which was not recommended. Guardant Reveal test for minimal residual disease planned; follow-up scheduled in one year.     ALLERGIES:  has no known allergies.  MEDICATIONS:  Current Outpatient  Medications  Medication Sig Dispense Refill   estradiol  (ESTRACE  VAGINAL) 0.1 MG/GM vaginal cream Place 1 Applicatorful vaginally at bedtime. 42.5 g 12   levothyroxine (SYNTHROID) 75 MCG tablet Take 75 mcg by mouth daily before breakfast.     liothyronine (CYTOMEL) 5 MCG tablet Take 5 mcg by mouth daily.     Magnesium  500 MG TABS Take by mouth.     Multiple Vitamin (MULTIVITAMIN) tablet Take 1 tablet by mouth daily.     No current facility-administered medications for this visit.    PHYSICAL EXAMINATION: ECOG PERFORMANCE STATUS: 1 - Symptomatic but completely ambulatory  Vitals:   02/10/24 0906  BP: 108/76  Pulse: 69  Resp: 18  Temp: 97.6 F (36.4 C)  SpO2: 100%   Filed Weights   02/10/24 0906  Weight: 140 lb 9.6 oz (63.8 kg)      LABORATORY DATA:  I have reviewed the data as listed    Latest Ref Rng & Units 06/16/2019   11:35 AM 05/26/2019    9:27 AM 05/05/2019   10:50 AM  CMP  Glucose 70 - 99 mg/dL 98  78  82   BUN 6 - 20 mg/dL 18  16  15    Creatinine 0.44 - 1.00 mg/dL 9.15  9.20  9.20   Sodium 135 - 145 mmol/L 140  140  138   Potassium 3.5 - 5.1 mmol/L 4.4  4.0  3.6   Chloride 98 - 111 mmol/L 106  107  103   CO2 22 - 32 mmol/L 24  26  28    Calcium 8.9 -  10.3 mg/dL 9.2  9.0  9.1   Total Protein 6.5 - 8.1 g/dL 7.0  6.7  6.8   Total Bilirubin 0.3 - 1.2 mg/dL 0.5  0.6  0.6   Alkaline Phos 38 - 126 U/L 69  59  63   AST 15 - 41 U/L 31  22  17    ALT 0 - 44 U/L 29  23  19      Lab Results  Component Value Date   WBC 7.6 06/16/2019   HGB 13.4 06/16/2019   HCT 41.7 06/16/2019   MCV 90.7 06/16/2019   PLT 152 06/16/2019   NEUTROABS 6.3 06/16/2019    ASSESSMENT & PLAN:  Malignant neoplasm of lower-outer quadrant of right breast of female, estrogen receptor positive (HCC) 01/15/2019:screening mammogram detected area of distortion in the lower central right breast with a possible associated mass. 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 She  underwent a lumpectomy with Dr. 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   Genetics were negative Lumpectomy and sentinel node biopsy showed no residual invasive cancer and 5 SLN negative   Treatment plan:  1.  Adjuvant chemotherapy with Taxotere  and Cytoxan  every 3 weeks x4 cycles from 04/14/2019 through 06/16/2019 2.  Adjuvant radiation 07/15/2019 3.  Adjuvant antiestrogen therapy with tamoxifen  20 mg daily stopped August 2021 due to intolerance -------------------------------------------------------------------------------------------------------------------------------------------------- Patient could not tolerate tamoxifen  because of how it made her feel.  With tamoxifen , She could not exercise.  Because of that she made a decision to discontinue it.   Breast cancer surveillance: 1. Breast exam 02/10/2024: Benign 2. mammogram 03/21/2023: Indeterminate 3 mm group of calcification of the lumpectomy site (biopsy: Sclerosing adenosis with fibrosis), density category B 3. Breast MRI 08/07/2023: Benign breast density category B 4.  Guardant reveal for MRD testing: Negative - July 2025   She has been staying very busy at [facility] radiology place in [place].  She is also mostly working at [employer]   Vaginal dryness: I sent a prescription for Estrace .  She could use it twice a week.   I recommended switching her from mammogram alternating with MRI to contrast-enhanced mammogram She is agreeable to this plan. Return to clinic in 1 year for follow-up    No orders of the defined types were placed in this encounter.  The patient has a good understanding of the overall plan. she agrees with it. she will call with any problems that may develop before  the next visit here.  I personally spent a total of 30 minutes in the care of the patient today including preparing to see the patient, getting/reviewing separately obtained history, performing a medically appropriate exam/evaluation, counseling and educating, placing orders, referring and communicating with other health care professionals, documenting clinical information in the EHR, independently interpreting results, communicating results, and coordinating care.   Viinay K Tayvion Lauder, MD 02/10/2024

## 2024-02-10 NOTE — Assessment & Plan Note (Signed)
 01/15/2019:screening mammogram detected area of distortion in the lower central right breast with a possible associated mass. 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 She underwent a lumpectomy with Dr. 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   Genetics were negative Lumpectomy and sentinel node biopsy showed no residual invasive cancer and 5 SLN negative   Treatment plan:  1.  Adjuvant chemotherapy with Taxotere  and Cytoxan  every 3 weeks x4 cycles from 04/14/2019 through 06/16/2019 2.  Adjuvant radiation 07/15/2019 3.  Adjuvant antiestrogen therapy with tamoxifen  20 mg daily stopped August 2021 due to intolerance -------------------------------------------------------------------------------------------------------------------------------------------------- Patient could not tolerate tamoxifen  because of how it made her feel.  With tamoxifen , She could not exercise.  Because of that she made a decision to discontinue it.   Breast cancer surveillance: 1. Breast exam 02/10/2024: Benign 2. mammogram 03/21/2023: Indeterminate 3 mm group of calcification of the lumpectomy site (biopsy: Sclerosing adenosis with fibrosis), density category B 3. Breast MRI 08/07/2023: Benign breast density category B 4.  Guardant reveal for MRD testing: Negative - July 2025   She has been staying very busy at [facility] radiology place in [place].   Vaginal dryness: I sent a prescription for Estrace .  She could use it twice a week.   We would like her to do guardant reveal testing for minimal residual disease for surveillance.   She will continue getting mammograms alternating with breast MRIs. Return to clinic in 1 year for follow-up

## 2024-02-14 ENCOUNTER — Encounter: Payer: Self-pay | Admitting: *Deleted

## 2024-02-14 NOTE — Progress Notes (Signed)
 Guardant reveal orders placed via their portal

## 2024-03-11 ENCOUNTER — Inpatient Hospital Stay
Admission: RE | Admit: 2024-03-11 | Discharge: 2024-03-11 | Payer: PRIVATE HEALTH INSURANCE | Attending: Adult Health | Admitting: Adult Health

## 2024-03-11 DIAGNOSIS — Z170 Estrogen receptor positive status [ER+]: Secondary | ICD-10-CM

## 2024-03-11 MED ORDER — IOPAMIDOL (ISOVUE-370) INJECTION 76%
100.0000 mL | Freq: Once | INTRAVENOUS | Status: AC | PRN
  Administered 2024-03-11: 100 mL via INTRAVENOUS

## 2024-03-25 ENCOUNTER — Encounter: Payer: Self-pay | Admitting: *Deleted

## 2024-03-25 NOTE — Progress Notes (Signed)
 Received message from guardant reveal team stating pt was unresponsive to outreach, orders will be canceled.

## 2025-02-10 ENCOUNTER — Inpatient Hospital Stay: Admitting: Hematology and Oncology
